# Patient Record
Sex: Female | Born: 1980
Health system: Southern US, Community
[De-identification: ages and names within clinical notes are randomized; demographics above are authoritative.]

## PROBLEM LIST (undated history)

## (undated) DIAGNOSIS — C50211 Malignant neoplasm of upper-inner quadrant of right female breast: Secondary | ICD-10-CM

## (undated) DIAGNOSIS — M797 Fibromyalgia: Secondary | ICD-10-CM

## (undated) DIAGNOSIS — R51 Headache: Secondary | ICD-10-CM

## (undated) DIAGNOSIS — D649 Anemia, unspecified: Secondary | ICD-10-CM

## (undated) DIAGNOSIS — F419 Anxiety disorder, unspecified: Secondary | ICD-10-CM

## (undated) DIAGNOSIS — Z171 Estrogen receptor negative status [ER-]: Secondary | ICD-10-CM

## (undated) DIAGNOSIS — J45909 Unspecified asthma, uncomplicated: Secondary | ICD-10-CM

## (undated) DIAGNOSIS — Z9221 Personal history of antineoplastic chemotherapy: Secondary | ICD-10-CM

## (undated) DIAGNOSIS — R519 Headache, unspecified: Secondary | ICD-10-CM

## (undated) HISTORY — DX: Unspecified asthma, uncomplicated: J45.909

## (undated) HISTORY — DX: Malignant neoplasm of upper-inner quadrant of right female breast: C50.211

## (undated) HISTORY — DX: Fibromyalgia: M79.7

## (undated) HISTORY — PX: RHINOPLASTY: SUR1284

## (undated) HISTORY — DX: Estrogen receptor negative status (ER-): Z17.1

## (undated) HISTORY — PX: ABDOMINAL HYSTERECTOMY: SHX81

---

## 2004-10-23 ENCOUNTER — Ambulatory Visit (HOSPITAL_BASED_OUTPATIENT_CLINIC_OR_DEPARTMENT_OTHER): Admission: RE | Admit: 2004-10-23 | Discharge: 2004-10-23 | Payer: Self-pay | Admitting: Otolaryngology

## 2004-10-23 ENCOUNTER — Ambulatory Visit (HOSPITAL_COMMUNITY): Admission: RE | Admit: 2004-10-23 | Discharge: 2004-10-23 | Payer: Self-pay | Admitting: Otolaryngology

## 2007-04-30 HISTORY — PX: KNEE SURGERY: SHX244

## 2014-01-17 DIAGNOSIS — Z1501 Genetic susceptibility to malignant neoplasm of breast: Secondary | ICD-10-CM | POA: Insufficient documentation

## 2014-01-17 DIAGNOSIS — R946 Abnormal results of thyroid function studies: Secondary | ICD-10-CM | POA: Insufficient documentation

## 2014-01-17 DIAGNOSIS — Z1502 Genetic susceptibility to malignant neoplasm of ovary: Secondary | ICD-10-CM

## 2014-01-17 DIAGNOSIS — M797 Fibromyalgia: Secondary | ICD-10-CM | POA: Insufficient documentation

## 2014-01-17 DIAGNOSIS — E785 Hyperlipidemia, unspecified: Secondary | ICD-10-CM | POA: Insufficient documentation

## 2014-01-17 DIAGNOSIS — R5383 Other fatigue: Secondary | ICD-10-CM | POA: Insufficient documentation

## 2014-03-18 ENCOUNTER — Encounter: Payer: Self-pay | Admitting: Gynecology

## 2014-03-18 ENCOUNTER — Ambulatory Visit: Attending: Gynecology | Admitting: Gynecology

## 2014-03-18 VITALS — BP 106/66 | HR 73 | Temp 98.3°F | Resp 20 | Ht 67.0 in | Wt 137.0 lb

## 2014-03-18 DIAGNOSIS — Z148 Genetic carrier of other disease: Secondary | ICD-10-CM | POA: Diagnosis not present

## 2014-03-18 DIAGNOSIS — Z7982 Long term (current) use of aspirin: Secondary | ICD-10-CM | POA: Insufficient documentation

## 2014-03-18 DIAGNOSIS — Z8041 Family history of malignant neoplasm of ovary: Secondary | ICD-10-CM | POA: Insufficient documentation

## 2014-03-18 DIAGNOSIS — Z87891 Personal history of nicotine dependence: Secondary | ICD-10-CM | POA: Insufficient documentation

## 2014-03-18 DIAGNOSIS — Z1509 Genetic susceptibility to other malignant neoplasm: Secondary | ICD-10-CM

## 2014-03-18 DIAGNOSIS — Z1502 Genetic susceptibility to malignant neoplasm of ovary: Secondary | ICD-10-CM

## 2014-03-18 DIAGNOSIS — Z79899 Other long term (current) drug therapy: Secondary | ICD-10-CM | POA: Diagnosis not present

## 2014-03-18 DIAGNOSIS — Z803 Family history of malignant neoplasm of breast: Secondary | ICD-10-CM

## 2014-03-18 DIAGNOSIS — Z1501 Genetic susceptibility to malignant neoplasm of breast: Secondary | ICD-10-CM

## 2014-03-18 NOTE — Patient Instructions (Signed)
Please call Dr. Fermin Schwab regarding your decision for surgery.

## 2014-03-18 NOTE — Progress Notes (Signed)
Consult Note: Gyn-Onc   Holly Parker 33 y.o. female  Chief Complaint  Patient presents with  . BRAC positive    2nd opinion    Assessment : 33 year old with a BRCA 1 mutation. She has a sister developed breast cancer at age 17 and a mother who developed advanced ovarian cancer at age 25.  Plan: Plan lengthy discussion about management options. The patient and her mother participated in the discussion. They're both aware that the patient has a significantly increased risk of developing both breast and ovarian cancer.  #1 ovarian cancer: Given that we do not have effective screening for ovarian cancer, bilateral salpingo-oophorectomy has been generally recommended in this high risk group of patients. At this juncture the patient is not quite certain that she wishes to proceed with that surgery although she believes she'll make a decision in the near future. The benefits of bilateral salpingo-oophorectomy were reviewed. In addition, the risks including impact and cardiovascular health, osteoporosis, and menopausal symptoms were also discussed. The pros and cons of performing hysterectomy the same time were discussed. In general I would not recommend performing a hysterectomy without clearcut benefits. I indicated that surgery could be performed laparoscopically has an outpatient in most cases. In the interval until the patient decides to go ahead with surgery, I would recommend that she have the pelvic ultrasound as well as a CA-125 value obtained. While "screening" for ovarian cancer has not been shown to be effective, the NCCN guidelines make that recommendation. Finally, if the patient chose to defer surgery for several years, and recommend that she be placed on combination oral contraceptives because of the demonstrated reduction in the incidence of ovarian cancer in oral contraceptive users.  #2 breast cancer: The patient also understands she is at increased risk of breast cancer. She has  had previous mammograms and an MRI was recommended by her primary care physician. Risk reduction by performing bilateral mastectomy and breast reconstruction was discussed. She is undecided as to when she would like to have mastectomies, at what location, and by what plastic surgeon.  All questions are answered and I would be happy to perform the bilateral salpingo-oophorectomy laparoscopically if she wished. In addition, the happy to help assist in making contact with a breast surgeon   HPI: 33 year old white married female gravida 3 para 3 who has recently been identified as carrying a BRCA1 mutation. Her mother at age 9 developed a stage IIIB ovarian cancer. I was her primary gynecologic oncologist performing her surgery as well as chemotherapy. The mother remains free of disease now with 12 years of follow-up.  In the interval, the patient's sister at age 32 developed breast cancer. With 2 family members having breast and ovarian cancer, the patient undertook genetic counseling and BRCA1 testing which turned out to be positive. She presents today for discussion of management options.  Review of Systems:10 point review of systems is negative except as noted in interval history.   Vitals: Blood pressure 106/66, pulse 73, temperature 98.3 F (36.8 C), temperature source Oral, resp. rate 20, height '5\' 7"'  (1.702 m), weight 137 lb (62.143 kg).  Physical Exam: Deferred       Not on File  History reviewed. No pertinent past medical history.  No past surgical history on file.  Current Outpatient Prescriptions  Medication Sig Dispense Refill  . sertraline (ZOLOFT) 100 MG tablet Take 100 mg by mouth.    Marland Kitchen aspirin-acetaminophen-caffeine (EXCEDRIN MIGRAINE) 250-250-65 MG per tablet Take 1 tablet by mouth  as needed for headache.    . cetirizine (ZYRTEC) 10 MG tablet Take 10 mg by mouth daily.    . Pseudoephedrine-Naproxen Na (ALEVE-D SINUS & HEADACHE PO) Take 1 each by mouth as needed.     No  current facility-administered medications for this visit.    History   Social History  . Marital Status: Single    Spouse Name: N/A    Number of Children: N/A  . Years of Education: N/A   Occupational History  . Not on file.   Social History Main Topics  . Smoking status: Former Smoker    Quit date: 09/16/2011  . Smokeless tobacco: Not on file  . Alcohol Use: No  . Drug Use: No  . Sexual Activity: Yes   Other Topics Concern  . Not on file   Social History Narrative  . No narrative on file    No family history on file.    Alvino Chapel, MD 03/18/2014, 12:22 PM

## 2016-04-29 DIAGNOSIS — Z171 Estrogen receptor negative status [ER-]: Secondary | ICD-10-CM

## 2016-04-29 DIAGNOSIS — C50211 Malignant neoplasm of upper-inner quadrant of right female breast: Secondary | ICD-10-CM

## 2016-04-29 HISTORY — DX: Malignant neoplasm of upper-inner quadrant of right female breast: C50.211

## 2016-04-29 HISTORY — DX: Estrogen receptor negative status (ER-): Z17.1

## 2016-06-28 DIAGNOSIS — J40 Bronchitis, not specified as acute or chronic: Secondary | ICD-10-CM | POA: Insufficient documentation

## 2016-08-21 DIAGNOSIS — K5909 Other constipation: Secondary | ICD-10-CM | POA: Insufficient documentation

## 2017-02-24 ENCOUNTER — Encounter: Payer: Self-pay | Admitting: Oncology

## 2017-02-27 ENCOUNTER — Ambulatory Visit: Admitting: Oncology

## 2017-02-28 ENCOUNTER — Ambulatory Visit: Admitting: Oncology

## 2017-02-28 ENCOUNTER — Telehealth: Payer: Self-pay | Admitting: Internal Medicine

## 2017-02-28 ENCOUNTER — Ambulatory Visit (HOSPITAL_BASED_OUTPATIENT_CLINIC_OR_DEPARTMENT_OTHER): Admitting: Oncology

## 2017-02-28 VITALS — BP 123/75 | HR 76 | Temp 97.6°F | Resp 18 | Ht 67.0 in | Wt 144.2 lb

## 2017-02-28 DIAGNOSIS — Z171 Estrogen receptor negative status [ER-]: Secondary | ICD-10-CM

## 2017-02-28 DIAGNOSIS — C50211 Malignant neoplasm of upper-inner quadrant of right female breast: Secondary | ICD-10-CM

## 2017-02-28 DIAGNOSIS — Z1501 Genetic susceptibility to malignant neoplasm of breast: Secondary | ICD-10-CM

## 2017-02-28 DIAGNOSIS — C50911 Malignant neoplasm of unspecified site of right female breast: Secondary | ICD-10-CM | POA: Insufficient documentation

## 2017-02-28 DIAGNOSIS — Z1502 Genetic susceptibility to malignant neoplasm of ovary: Principal | ICD-10-CM

## 2017-02-28 NOTE — Telephone Encounter (Signed)
Consult for Breast Ca .Appt transferred from [facility]/DR Magrinat to Dr B, per Ins non par.  New patient packet left at Registration for patient to complete upon arrival.  Appt schd and conf with patient.  (schd per Stacey/Nurse Mgr/45 mins/ no referral)  Patient will fax notes**

## 2017-02-28 NOTE — Telephone Encounter (Signed)
BRCA-1 carrier per hx; upper outer right- Her -2NEG; ER/PR- ? Marland Kitchen Awaiting further records. appt nov 5th.

## 2017-02-28 NOTE — Progress Notes (Signed)
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  She brought it up to the attention of her gynecologist, Dr. Donn Parker and was set up for unilateral diagnostic mammography with tomography and ultrasonography at [facility] on 02/17/2017. The breast density was category C. There is a highly suspicious mass at 1 o'clock in the medial right breast measuring 1.3 cm. There is a second indeterminate mass at 12 o'clock, immediately behind the nipple, and is low in density, measuring 0.6 cm. There is no axillary adenopathy.   Biopsy of the mass at 1 o'clock was obtained on 02/20/2017. The final pathology ([id number]) showed Invasive Ductal Carcinoma, Grade 3. The biopsy was 0% negative for both estrogen and progesterone receptors. The Proliferation Marker Ki67 showed 80%. HER-2 negative as well, with signals ratio at 1.25 and number per cell at 1.75.   Of note, patient is BRCA 1 positive.   Holly Parker subsequent history is as detailed below.  INTERVAL HISTORY: Holly Parker was evaluated in the breast clinic February 28, 2017 accompanied by her husband Holly Parker.   REVIEW OF SYSTEMS: Holly Parker reports intermittent pain to her right breast. She has frequent, severe, headaches that may be related to her seasonal allergies and bruxism. She  denies visual changes, nausea, vomiting, or dizziness. There has been no unusual cough, phlegm production, or pleurisy. This been no change in bowel or bladder habits. She denies unexplained fatigue or unexplained weight loss, bleeding, rash, or fever. A detailed review of systems was otherwise entirely negative.   PAST MEDICAL HISTORY: No past medical history on file.  Many years ago she was diagnosed with Fibromyalgia, which is currently stable. She was very active growing up playing many different sports and was diagnosed with exercise induced asthma. Pt also smoked 0.5 ppd for 10 years, but quit about 5 years ago. The last three years she has had pneumonia twice. She has good bladder control. She denies a history of ulcers and glaucoma.  PAST SURGICAL HISTORY: No past surgical history on file.  When she was living in [place], [place], she had knee surgery for her torn meniscus. She also had two rhino plasties. The first time was to correct her deviated septum. The second surgery was corrective after she broke her nose playing sports. No past surgical history of a colonoscopy or bone density scan.  FAMILY HISTORY No family history on file.  The patient's father, 50 and is alive. Her mother, Holly Parker, 15 is also alive and has a PMHx of ovarian cancer and breast cancer. No brothers. She has 1 sister, Holly Parker, who also had breast cancer.  Of course, both Holly Parker and Holly Parker have been my patients   GYNECOLOGIC HISTORY:  No LMP recorded.  Menarche: 36 years old  [id number] Pt was 36 years old the first time she carried a baby to term. She still has her menstrual cycle which will last for about 4-5 days each time, with about 2 of those days  with a heavy flow.   SOCIAL HISTORY:  Holly Parker is a [employer]. Her husband, Holly Parker, is a Government social research officer at [employer]. He is a retired Company secretary. They have three children. She has one son, Holly Parker, who is 61 years old, and two daughters, Holly Parker, 52, and Holly Parker,  4. She attends [employer] or [employer].    ADVANCED DIRECTIVES: The patient and her husband are each other's healthcare power of attorney   HEALTH MAINTENANCE: Social History  Substance Use Topics  . Smoking status: Former Smoker    Quit date: 09/16/2011  . Smokeless tobacco: Not on file  . Alcohol use No     Colonoscopy: No colonoscopy  PAP: Up-to-date  Bone density: No bone density   No Known Allergies  Current Outpatient Prescriptions  Medication Sig Dispense Refill  . aspirin-acetaminophen-caffeine (EXCEDRIN MIGRAINE) 250-250-65 MG per tablet Take 1 tablet by mouth as needed for headache.    . cetirizine (ZYRTEC) 10 MG tablet Take 10 mg by mouth daily.    . Pseudoephedrine-Naproxen Na (ALEVE-D SINUS & HEADACHE PO) Take 1 each by mouth as needed.    . sertraline (ZOLOFT) 100 MG tablet Take 100 mg by mouth.     No current facility-administered medications for this visit.     OBJECTIVE: Young white woman who appears well  Vitals:   02/28/17 1630  BP: 123/75  Pulse: 76  Resp: 18  Temp: 97.6 F (36.4 C)  SpO2: 100%     Body mass index is 22.58 kg/m.   Wt Readings from Last 3 Encounters:  02/28/17 144 lb 3.2 oz (65.4 kg)  03/18/14 137 lb (62.1 kg)      ECOG FS:0 - Asymptomatic  Ocular: Sclerae unicteric, pupils round and equal Ear-nose-throat: Oropharynx clear and moist Lymphatic: No cervical or supraclavicular adenopathy Lungs no rales or rhonchi Heart regular rate and rhythm Abd soft, nontender, positive bowel sounds MSK no focal spinal tenderness, no joint edema Neuro: non-focal, well-oriented, appropriate affect Breasts: Right breast is status post recent biopsy.  There is a minimal ecchymosis.  I do not palpate a well-defined mass.  The left breast is benign.  Both axillae are benign.   LAB RESULTS:  CMP  No results found for: NA, K, CL, CO2, GLUCOSE, BUN, CREATININE, CALCIUM, PROT, ALBUMIN, AST, ALT, ALKPHOS, BILITOT, GFRNONAA, GFRAA  No  results found for: TOTALPROTELP, ALBUMINELP, A1GS, A2GS, BETS, BETA2SER, GAMS, MSPIKE, SPEI  No results found for: KPAFRELGTCHN, LAMBDASER, KAPLAMBRATIO  No results found for: WBC, NEUTROABS, HGB, HCT, MCV, PLT  '@LASTCHEMISTRY' @  No results found for: LABCA2  No components found for: TLXBWI203  No results for input(s): INR in the last 168 hours.  No results found for: LABCA2  No results found for: TDH741  No results found for: ULA453  No results found for: MIW803  No results found for: CA2729  No components found for: HGQUANT  No results found for: CEA1 / No results found for: CEA1   No results found for: AFPTUMOR  No results found for: CHROMOGRNA  No results found for: PSA1  No visits with results within 3 Day(s) from this visit.  Latest known visit with results is:  No results found for any previous visit.    (this displays the last labs from the last 3 days)  No results found for: TOTALPROTELP, ALBUMINELP, A1GS, A2GS, BETS, BETA2SER, GAMS, MSPIKE, SPEI (this displays SPEP labs)  No results found for: KPAFRELGTCHN, LAMBDASER, KAPLAMBRATIO (kappa/lambda light chains)  No results found for: HGBA,  HGBA2QUANT, HGBFQUANT, HGBSQUAN (Hemoglobinopathy evaluation)   No results found for: LDH  No results found for: IRON, TIBC, IRONPCTSAT (Iron and TIBC)  No results found for: FERRITIN  Urinalysis No results found for: COLORURINE, APPEARANCEUR, LABSPEC, PHURINE, GLUCOSEU, HGBUR, BILIRUBINUR, KETONESUR, PROTEINUR, UROBILINOGEN, NITRITE, LEUKOCYTESUR   STUDIES: Outside studies reviewed with patient  ELIGIBLE FOR AVAILABLE RESEARCH PROTOCOL: no  ASSESSMENT: 36 y.o. BRCA1 positive [place] woman status post right breast upper inner quadrant biopsy 02/20/2017 for a clinical T1c N0, stage IB invasive ductal carcinoma, grade 3, triple negative, with an MIB-1 of 80%  (1) recommend neoadjuvant chemotherapy with cyclophosphamide and doxorubicin in dose dense fashion  x4 followed by carboplatin and paclitaxel weekly x12  (2) bilateral mastectomies with right sentinel lymph node sampling to follow  (3) the patient is interested in reconstruction  (4) if the patient wishes to preserve fertility would consider goserelin monthly through chemo therapy  PLAN: We spent the better part of today's hour-long appointment discussing the biology of her diagnosis and the specifics of her situation. We first reviewed the fact that cancer is not one disease but more than 100 different diseases and that it is important to keep them separate-- otherwise when friends and relatives discuss their own cancer experiences with Shakthi confusion can result. Similarly we explained that if breast cancer spreads to the bone or liver, the patient would not have bone cancer or liver cancer, but breast cancer in the bone and breast cancer in the liver: one cancer in three places-- not 3 different cancers which otherwise would have to be treated in 3 different ways.  We discussed the difference between local and systemic therapy. In terms of loco-regional treatment, lumpectomy plus radiation is equivalent to mastectomy as far as survival is concerned.  However in patients with a BRCA mutation the risk of another breast cancer in the future is high enough that bilateral mastectomies are a reasonable option.  This is the option the patient prefers.  She understands that in most cases if she does undergo mastectomy radiation would not be necessary  We also noted that in terms of sequencing of treatments, whether systemic therapy or surgery is done first does not affect the ultimate outcome.  In Seara's case I would recommend neoadjuvant chemotherapy, because he will gave her several months so she can plan her surgery and specifically the type of reconstruction she may want to end up with  We then discussed the rationale for systemic therapy. There is some risk that this cancer may have already  spread to other parts of her body. Patients frequently ask at this point about bone scans, CAT scans and PET scans to find out if they have occult breast cancer somewhere else. The problem is that in early stage disease we are much more likely to find false positives then true cancers and this would expose the patient to unnecessary procedures as well as unnecessary radiation. Scans cannot answer the question the patient really would like to know, which is whether she has microscopic disease elsewhere in her body. For those reasons we do not recommend them.  Of course we would proceed to aggressive evaluation of any symptoms that might suggest metastatic disease, but that is not the case here.  Next we went over the options for systemic therapy which are anti-estrogens, anti-HER-2 immunotherapy, and chemotherapy. Chika does not meet criteria for anti-HER-2 immunotherapy or antiestrogens.  Her only systemic treatment option is chemotherapy.  We specifically discussed cyclophosphamide and doxorubicin in  dose dense fashion followed by carboplatin and paclitaxel weekly x12.  This should result in a high chance of complete pathologic response.  Kalyssa already is going to meet my partner Dr Rogue Bussing next week in [place] because her insurance considers our [place] office to be out of network.  In a sense therefore she sought a "second opinion first".  She understands that I can only tell her how I would treat her but this in no way compromises my partner who may or may not agree with this plan and may propose a different plan when they do meet next week.  Keyia wishes for me to initiate referrals to Dr. Ninfa Linden and Dr. Marla Roe.  She hopes to be able to have a port placed as soon as possible.  Of course she will need other tests which will be arranged through our [place] office  At this point I have not made a return appointment here for [facility].  She knows I will be glad to see her again at any  point in the future if and when the need arises.   Dayshon Roback, Holly Dad, MD  02/28/17 5:39 PM Medical Oncology and Hematology [facility] [address] [place], [place] [address] Tel. [phone]    Fax. [phone]  This document serves as a record of services personally performed by Chauncey Cruel, MD. It was created on his behalf by Margit Banda, a trained medical scribe. The creation of this record is based on the scribe's personal observations and the provider's statements to them. This document has been checked and approved by the attending provider.

## 2017-03-03 ENCOUNTER — Encounter: Payer: Self-pay | Admitting: *Deleted

## 2017-03-03 ENCOUNTER — Encounter: Payer: Self-pay | Admitting: Internal Medicine

## 2017-03-03 ENCOUNTER — Encounter: Payer: Self-pay | Admitting: Oncology

## 2017-03-03 ENCOUNTER — Inpatient Hospital Stay

## 2017-03-03 ENCOUNTER — Inpatient Hospital Stay: Attending: Internal Medicine | Admitting: Internal Medicine

## 2017-03-03 ENCOUNTER — Encounter (INDEPENDENT_AMBULATORY_CARE_PROVIDER_SITE_OTHER): Payer: Self-pay

## 2017-03-03 VITALS — BP 110/73 | HR 79 | Resp 14 | Wt 143.6 lb

## 2017-03-03 DIAGNOSIS — Z006 Encounter for examination for normal comparison and control in clinical research program: Secondary | ICD-10-CM | POA: Diagnosis not present

## 2017-03-03 DIAGNOSIS — Z5111 Encounter for antineoplastic chemotherapy: Secondary | ICD-10-CM | POA: Insufficient documentation

## 2017-03-03 DIAGNOSIS — Z23 Encounter for immunization: Secondary | ICD-10-CM | POA: Diagnosis not present

## 2017-03-03 DIAGNOSIS — Z79899 Other long term (current) drug therapy: Secondary | ICD-10-CM | POA: Diagnosis not present

## 2017-03-03 DIAGNOSIS — Z171 Estrogen receptor negative status [ER-]: Secondary | ICD-10-CM | POA: Diagnosis not present

## 2017-03-03 DIAGNOSIS — Z803 Family history of malignant neoplasm of breast: Secondary | ICD-10-CM | POA: Diagnosis not present

## 2017-03-03 DIAGNOSIS — M797 Fibromyalgia: Secondary | ICD-10-CM

## 2017-03-03 DIAGNOSIS — Z8041 Family history of malignant neoplasm of ovary: Secondary | ICD-10-CM | POA: Insufficient documentation

## 2017-03-03 DIAGNOSIS — Z801 Family history of malignant neoplasm of trachea, bronchus and lung: Secondary | ICD-10-CM | POA: Insufficient documentation

## 2017-03-03 DIAGNOSIS — Z87891 Personal history of nicotine dependence: Secondary | ICD-10-CM | POA: Insufficient documentation

## 2017-03-03 DIAGNOSIS — Z806 Family history of leukemia: Secondary | ICD-10-CM | POA: Insufficient documentation

## 2017-03-03 DIAGNOSIS — J45909 Unspecified asthma, uncomplicated: Secondary | ICD-10-CM | POA: Insufficient documentation

## 2017-03-03 DIAGNOSIS — C50211 Malignant neoplasm of upper-inner quadrant of right female breast: Secondary | ICD-10-CM

## 2017-03-03 LAB — COMPREHENSIVE METABOLIC PANEL
ALK PHOS: 69 U/L (ref 38–126)
ALT: 12 U/L — AB (ref 14–54)
AST: 17 U/L (ref 15–41)
Albumin: 4.5 g/dL (ref 3.5–5.0)
Anion gap: 7 (ref 5–15)
BILIRUBIN TOTAL: 0.5 mg/dL (ref 0.3–1.2)
BUN: 15 mg/dL (ref 6–20)
CALCIUM: 9.3 mg/dL (ref 8.9–10.3)
CO2: 27 mmol/L (ref 22–32)
CREATININE: 0.65 mg/dL (ref 0.44–1.00)
Chloride: 100 mmol/L — ABNORMAL LOW (ref 101–111)
Glucose, Bld: 88 mg/dL (ref 65–99)
Potassium: 4.1 mmol/L (ref 3.5–5.1)
Sodium: 134 mmol/L — ABNORMAL LOW (ref 135–145)
Total Protein: 7.9 g/dL (ref 6.5–8.1)

## 2017-03-03 LAB — CBC WITH DIFFERENTIAL/PLATELET
Basophils Absolute: 0.1 10*3/uL (ref 0–0.1)
Basophils Relative: 1 %
EOS ABS: 0.2 10*3/uL (ref 0–0.7)
Eosinophils Relative: 3 %
HCT: 38.5 % (ref 35.0–47.0)
HEMOGLOBIN: 13.2 g/dL (ref 12.0–16.0)
LYMPHS ABS: 2.3 10*3/uL (ref 1.0–3.6)
Lymphocytes Relative: 27 %
MCH: 31.2 pg (ref 26.0–34.0)
MCHC: 34.2 g/dL (ref 32.0–36.0)
MCV: 91.3 fL (ref 80.0–100.0)
Monocytes Absolute: 0.5 10*3/uL (ref 0.2–0.9)
Monocytes Relative: 6 %
NEUTROS PCT: 63 %
Neutro Abs: 5.5 10*3/uL (ref 1.4–6.5)
PLATELETS: 366 10*3/uL (ref 150–440)
RBC: 4.22 MIL/uL (ref 3.80–5.20)
RDW: 13 % (ref 11.5–14.5)
WBC: 8.6 10*3/uL (ref 3.6–11.0)

## 2017-03-03 MED ORDER — PNEUMOCOCCAL VAC POLYVALENT 25 MCG/0.5ML IJ INJ
0.5000 mL | INJECTION | Freq: Once | INTRAMUSCULAR | Status: DC
  Filled 2017-03-03: qty 0.5

## 2017-03-03 MED ORDER — INFLUENZA VAC SPLIT QUAD 0.5 ML IM SUSY
0.5000 mL | PREFILLED_SYRINGE | Freq: Once | INTRAMUSCULAR | Status: AC
  Administered 2017-03-03: 0.5 mL via INTRAMUSCULAR
  Filled 2017-03-03: qty 0.5

## 2017-03-03 NOTE — Progress Notes (Signed)
[facility]  Patient Care Team: Patient, No Pcp Per as PCP - General (General Practice) Magrinat, Virgie Dad, MD as Consulting Physician (Oncology) Armandina Stammer, MD (Obstetrics and Gynecology) Dillingham, Loel Lofty, DO as Attending Physician (Plastic Surgery) Coralie Keens, MD as Consulting Physician (General Surgery) Cammie Sickle, MD as Consulting Physician (Internal Medicine)  CHIEF COMPLAINTS/PURPOSE OF CONSULTATION:  Breast cancer  #  Oncology History   # OCT 2018- RIGHT BREAST UIQ -[place]; TRIPLE NEGATIVE; ki-67-high [25m-1'O clock-Bx- proven; [place]]; another- 12'O clock-41m Not biopsied. [s/p Dr.Magrinaut; [place]]; cT1c(m) cN0;  G-3; ki-67- 80%  #   # BRCA-1 Gene mutated [mom-ovarian cancer-Stage IIIB/Brca; older sister- breast ca/brca-mutated]     Malignant neoplasm of upper-inner quadrant of right breast in female, estrogen receptor negative ([facility]   HISTORY OF PRESENTING ILLNESS:  Holly Payor89.o.  female with a history of BRCA1 mutation [diagnosis on screening]- noted to have intermittent right breast pain or the last 2 months. She followed up with a gynecologist- had a unilateral mammogram- highly suspicious mass at 1 o'clock in the medial right breast measuring 1.3 cm. There is a second indeterminate mass at 12 o'clock, immediately behind the nipple, and is low in density, measuring 0.6 cm. There is no axillary adenopathy.  [facility]. Patient is interested in bilateral mastectomies and reconstruction.   Otherwise patient does not have any chronic medical problems. She denies any unusual headaches or shortness of breath or cough.  ROS: A complete 10 point review of system is done which is negative except mentioned above in history of present illness  MEDICAL HISTORY:  Past Medical History:  Diagnosis Date  . Asthma    Excercise induced   . Fibromyalgia     SURGICAL HISTORY: Past Surgical History:  Procedure Laterality Date  . KNEE SURGERY Left    Torn meniscus   . 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 Marital status: Single    Spouse name: Not on file  . Number of children: Not on file  . Years of education: Not on file  . Highest education level: Not on file  Social Needs  . Financial resource strain: Not on file  . Food insecurity - worry: Not on file  . Food insecurity - inability: Not on file  . Transportation needs - medical: Not on file  . Transportation needs - non-medical: Not on file  Occupational History  . Not on file  Tobacco Use  . Smoking status: Former Smoker    Last attempt to quit: 09/16/2011    Years since quitting: 5.4  Substance and Sexual Activity  . Alcohol use: No  . Drug use: No  . Sexual activity: Yes  Other Topics Concern  . Not on file  Social History Narrative  . Not on file    FAMILY HISTORY: Family History  Problem Relation Age of Onset  . Cancer Mother        Ovarian cancer (2003), breast cancer(2017) & squamous cell   . Leukemia Mother   . Cancer Sister        breast cancer  . Melanoma Father   . Lung cancer Paternal Grandmother     ALLERGIES:  has No Known Allergies.  MEDICATIONS:  Current Outpatient Medications  Medication Sig Dispense Refill  . aspirin-acetaminophen-caffeine ([phone]  MG per tablet Take 1 tablet by mouth as needed for headache.    . loratadine (CLARITIN) 10 MG tablet Take 10 mg daily by mouth.    . Pseudoephedrine-Naproxen Na (ALEVE-D SINUS & HEADACHE PO) Take 1 each by mouth as needed.    . sertraline (ZOLOFT) 100 MG tablet Take 100 mg by mouth.     No current facility-administered medications for this visit.       Marland Kitchen  PHYSICAL EXAMINATION: ECOG PERFORMANCE STATUS: 0 - Asymptomatic  Vitals:   03/03/17  1409  BP: 110/73  Pulse: 79  Resp: 14   Filed Weights   03/03/17 1406 03/03/17 1409  Weight: 143 lb 9.6 oz (65.1 kg) 143 lb 9.6 oz (65.1 kg)    GENERAL: Well-nourished well-developed; Alert, no distress and comfortable.   Accompanied by her mother. EYES: no pallor or icterus OROPHARYNX: no thrush or ulceration; good dentition  NECK: supple, no masses felt LYMPH:  no palpable lymphadenopathy in the cervical, axillary or inguinal regions LUNGS: clear to auscultation and  No wheeze or crackles HEART/CVS: regular rate & rhythm and no murmurs; No lower extremity edema ABDOMEN: abdomen soft, non-tender and normal bowel sounds Musculoskeletal:no cyanosis of digits and no clubbing  PSYCH: alert & oriented x 3 with fluent speech NEURO: no focal motor/sensory deficits SKIN:  no rashes or significant lesions; .Right and left BREAST exam [in the presence of nurse]- right breast -no unusual skin changes; except for biopsy hematoma; ? Vague 1cm mass felt in upper innner quad;    LABORATORY DATA:  I have reviewed the data as listed Lab Results  Component Value Date   WBC 8.6 03/03/2017   HGB 13.2 03/03/2017   HCT 38.5 03/03/2017   MCV 91.3 03/03/2017   PLT 366 03/03/2017   Recent Labs    03/03/17 1548  NA 134*  K 4.1  CL 100*  CO2 27  GLUCOSE 88  BUN 15  CREATININE 0.65  CALCIUM 9.3  GFRNONAA >60  GFRAA >60  PROT 7.9  ALBUMIN 4.5  AST 17  ALT 12*  ALKPHOS 69  BILITOT 0.5    RADIOGRAPHIC STUDIES: I have personally reviewed the radiological images as listed and agreed with the findings in the report. No results found.  ASSESSMENT & PLAN:   Malignant neoplasm of upper-inner quadrant of right breast in female, estrogen receptor negative ([place]) # Likely stage I ER/PR negative right breast cancer- discussed the general treatment plan for breast cancer.  # Patient is interested in bilateral mastectomies. Discussed the need for chemotherapy either adjuvant or neoadjuvant. I  think it's reasonable to proceed with neoadjuvant chemotherapy at this time. Recommend dose dense Adriamycin and Cytoxan followed by weekly Taxol. I plan to adding carboplatin to weekly Taxol- especially given her BRCA positive disease/also being triple negative. Discussed that the chance for pathologic response as one study- went up from 45% to 60%. Discussed that she has a high likelihood of having pathologic response.   # Discussed the potential side effects including but not limited to-increasing fatigue, nausea vomiting, diarrhea, hair loss, sores in the mouth, increase risk of infection and also neuropathy. Also discussed regarding potential side effects of heart failure/leukemia with Adriamycin. Check a MUGA scan.  # Recommend a port placement. Recommend starting chemotherapy in approximately 1 week from now.   #Patient will need chemotherapy education.  Patient also follow-up with me with cycle #1 of chemotherapy.   #Patient will get a flu shot and also a pneumonia shot.  #  Also discussed regarding fertility; patient not sure if she wants to have any more children; I discussed option of using goserelin prior to starting chemotherapy.  She will inform me as soon as possible.   #Patient will need also need bilateral salpingo-oophorectomy down the line.  Thank you Dr.Magrinaut for allowing me to participate in the care of your pleasant patient. Please do not hesitate to contact me with questions or concerns in the interim.      All questions were answered. The patient knows to call the clinic with any problems, questions or concerns.    Cammie Sickle, MD 03/04/2017 5:05 PM

## 2017-03-03 NOTE — Progress Notes (Signed)
Patient is here for a new patient visit today.

## 2017-03-04 ENCOUNTER — Other Ambulatory Visit: Payer: Self-pay | Admitting: Surgery

## 2017-03-04 LAB — CANCER ANTIGEN 27.29: CAN 27.29: 21.7 U/mL (ref 0.0–38.6)

## 2017-03-04 MED ORDER — ONDANSETRON HCL 8 MG PO TABS: ORAL_TABLET | ORAL | 1 refills | Status: DC

## 2017-03-04 MED ORDER — LIDOCAINE-PRILOCAINE 2.5-2.5 % EX CREA: 1.0000 "application " | TOPICAL_CREAM | CUTANEOUS | 0 refills | Status: DC | PRN

## 2017-03-04 MED ORDER — PROCHLORPERAZINE MALEATE 10 MG PO TABS: 10.0000 mg | ORAL_TABLET | Freq: Four times a day (QID) | ORAL | 1 refills | Status: DC | PRN

## 2017-03-04 NOTE — Progress Notes (Signed)
START ON PATHWAY REGIMEN - Breast     A cycle is every 14 days (cycles 1-4):     Doxorubicin      Cyclophosphamide      Pegfilgrastim-xxxx    A cycle is every 21 days (cycles 5-8):     Paclitaxel      Carboplatin   **Always confirm dose/schedule in your pharmacy ordering system**    Patient Characteristics: Preoperative or Nonsurgical Candidate (Clinical Staging), Neoadjuvant Therapy followed by Surgery, Invasive Disease, Chemotherapy, HER2 Negative/Unknown/Equivocal, ER Negative/Unknown, Platinum Therapy Indicated Therapeutic Status: Preoperative or Nonsurgical Candidate (Clinical Staging) AJCC M Category: cM0 AJCC Grade: G3 Breast Surgical Plan: Neoadjuvant Therapy followed by Surgery ER Status: Negative (-) AJCC 8 Stage Grouping: IB HER2 Status: Negative (-) AJCC T Category: cT1c AJCC N Category: cN0 PR Status: Negative (-) Type of Therapy: Platinum Therapy Indicated Intent of Therapy: Curative Intent, Discussed with Patient

## 2017-03-04 NOTE — Assessment & Plan Note (Addendum)
#  Likely stage I ER/PR negative right breast cancer- discussed the general treatment plan for breast cancer.  # Patient is interested in bilateral mastectomies. Discussed the need for chemotherapy either adjuvant or neoadjuvant. I think it's reasonable to proceed with neoadjuvant chemotherapy at this time. Recommend dose dense Adriamycin and Cytoxan followed by weekly Taxol. I plan to adding carboplatin to weekly Taxol- especially given her BRCA positive disease/also being triple negative. Discussed that the chance for pathologic response as one study- went up from 45% to 60%. Discussed that she has a high likelihood of having pathologic response.   # Discussed the potential side effects including but not limited to-increasing fatigue, nausea vomiting, diarrhea, hair loss, sores in the mouth, increase risk of infection and also neuropathy. Also discussed regarding potential side effects of heart failure/leukemia with Adriamycin. Check a MUGA scan.  # Recommend a port placement. Recommend starting chemotherapy in approximately 1 week from now.   #Patient will need chemotherapy education.  Patient also follow-up with me with cycle #1 of chemotherapy.   #Patient will get a flu shot and also a pneumonia shot.  #Also discussed regarding fertility; patient not sure if she wants to have any more children; I discussed option of using goserelin prior to starting chemotherapy.  She will inform me as soon as possible.   #Patient will need also need bilateral salpingo-oophorectomy down the line.  Thank you Dr.Magrinaut for allowing me to participate in the care of your pleasant patient. Please do not hesitate to contact me with questions or concerns in the interim.

## 2017-03-04 NOTE — Progress Notes (Signed)
  Oncology Nurse Navigator Documentation  Navigator Location: [facility] (03/04/17 1000) Referral date to RadOnc/MedOnc: 03/03/17 (03/04/17 1000) )Navigator Encounter Type: Initial MedOnc (03/04/17 1000)                       Treatment Phase: Pre-Tx/Tx Discussion (03/04/17 1000) Barriers/Navigation Needs: Education (03/04/17 1000) Education: Understanding Cancer/ Treatment Options;Newly Diagnosed Cancer Education (03/04/17 1000) Interventions: Education (03/04/17 1000)     Education Method: Written (03/04/17 1000)  Support Groups/Services: Kids Can;Breast Support Group (03/04/17 1000)             Time Spent with Patient: 90 (03/04/17 1000)   Met patient and her mom during her initial medical oncology consult with Dr. Rogue Bussing.  Patient is BRCA 1 positive, and newly diagnosed with triple negative right breast cancer.  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  Dr. Rogue Bussing discussed future desire to have more children.  He is considering additional treatment to preserve fertility.  She is going to discuss with her husband and let him know her plans.  Gave patient breast cancer educational literature, "My Breast Cancer Treatment Handbook" by Josephine Igo, RN.    She is to call if she has any questions or needs.

## 2017-03-05 ENCOUNTER — Inpatient Hospital Stay

## 2017-03-05 DIAGNOSIS — Z23 Encounter for immunization: Secondary | ICD-10-CM | POA: Insufficient documentation

## 2017-03-06 ENCOUNTER — Encounter
Admission: RE | Admit: 2017-03-06 | Discharge: 2017-03-06 | Disposition: A | Discharge status: Home / Self Care | Source: Ambulatory Visit | Attending: Internal Medicine | Admitting: Internal Medicine

## 2017-03-06 ENCOUNTER — Inpatient Hospital Stay

## 2017-03-06 ENCOUNTER — Other Ambulatory Visit: Payer: Self-pay

## 2017-03-06 ENCOUNTER — Telehealth: Payer: Self-pay | Admitting: Oncology

## 2017-03-06 DIAGNOSIS — C50211 Malignant neoplasm of upper-inner quadrant of right female breast: Secondary | ICD-10-CM | POA: Diagnosis present

## 2017-03-06 DIAGNOSIS — Z23 Encounter for immunization: Secondary | ICD-10-CM

## 2017-03-06 DIAGNOSIS — Z5111 Encounter for antineoplastic chemotherapy: Secondary | ICD-10-CM | POA: Diagnosis not present

## 2017-03-06 DIAGNOSIS — Z171 Estrogen receptor negative status [ER-]: Secondary | ICD-10-CM | POA: Diagnosis present

## 2017-03-06 MED ORDER — PNEUMOCOCCAL VAC POLYVALENT 25 MCG/0.5ML IJ INJ
0.5000 mL | INJECTION | Freq: Once | INTRAMUSCULAR | Status: AC
  Administered 2017-03-06: 0.5 mL via INTRAMUSCULAR
  Filled 2017-03-06 (×6): qty 0.5

## 2017-03-06 MED ORDER — TECHNETIUM TC 99M-LABELED RED BLOOD CELLS IV KIT
23.8100 | PACK | Freq: Once | INTRAVENOUS | Status: AC | PRN
  Administered 2017-03-06: 23.81 via INTRAVENOUS

## 2017-03-06 NOTE — Telephone Encounter (Signed)
Per 11/2 - no los at check out °

## 2017-03-07 ENCOUNTER — Telehealth: Payer: Self-pay | Admitting: *Deleted

## 2017-03-07 NOTE — Telephone Encounter (Signed)
Spoke with patient. IR port placement scheduled for 11/19. Pt instructed to be NPO s/p midnight prior to procedure. And to [facility] at 10 am. Teach back process performed. Patient also informed that MD sent new rx for emla cream and antiemetics to pharmacy. Pt also instructed to pick up OTC imodium ad and stool softeners to have on available for chemotherapy induced side effects.  Pt also provided an apt for chemotherapy - AC. She prefers to start on Wednesday 11/21 in [place] as her family would be off on this day instead of that Tuesday in [place].

## 2017-03-10 ENCOUNTER — Other Ambulatory Visit: Payer: Self-pay | Admitting: Surgery

## 2017-03-10 ENCOUNTER — Inpatient Hospital Stay

## 2017-03-10 NOTE — Patient Instructions (Signed)
Doxorubicin injection What is this medicine? DOXORUBICIN (dox oh ROO bi sin) is a chemotherapy drug. It is used to treat many kinds of cancer like leukemia, lymphoma, neuroblastoma, sarcoma, and Wilms' tumor. It is also used to treat bladder cancer, breast cancer, lung cancer, ovarian cancer, stomach cancer, and thyroid cancer. This medicine may be used for other purposes; ask your health care provider or pharmacist if you have questions. COMMON BRAND NAME(S): Adriamycin, Adriamycin PFS, Adriamycin RDF, Rubex What should I tell my health care provider before I take this medicine? They need to know if you have any of these conditions: -heart disease -history of low blood counts caused by a medicine -liver disease -recent or ongoing radiation therapy -an unusual or allergic reaction to doxorubicin, other chemotherapy agents, other medicines, foods, dyes, or preservatives -pregnant or trying to get pregnant -breast-feeding How should I use this medicine? This drug is given as an infusion into a vein. It is administered in a hospital or clinic by a specially trained health care professional. If you have pain, swelling, burning or any unusual feeling around the site of your injection, tell your health care professional right away. Talk to your pediatrician regarding the use of this medicine in children. Special care may be needed. Overdosage: If you think you have taken too much of this medicine contact a poison control center or emergency room at once. NOTE: This medicine is only for you. Do not share this medicine with others. What if I miss a dose? It is important not to miss your dose. Call your doctor or health care professional if you are unable to keep an appointment. What may interact with this medicine? This medicine may interact with the following medications: -6-mercaptopurine -paclitaxel -phenytoin -St. John's Wort -trastuzumab -verapamil This list may not describe all possible  interactions. Give your health care provider a list of all the medicines, herbs, non-prescription drugs, or dietary supplements you use. Also tell them if you smoke, drink alcohol, or use illegal drugs. Some items may interact with your medicine. What should I watch for while using this medicine? This drug may make you feel generally unwell. This is not uncommon, as chemotherapy can affect healthy cells as well as cancer cells. Report any side effects. Continue your course of treatment even though you feel ill unless your doctor tells you to stop. There is a maximum amount of this medicine you should receive throughout your life. The amount depends on the medical condition being treated and your overall health. Your doctor will watch how much of this medicine you receive in your lifetime. Tell your doctor if you have taken this medicine before. You may need blood work done while you are taking this medicine. Your urine may turn red for a few days after your dose. This is not blood. If your urine is dark or brown, call your doctor. In some cases, you may be given additional medicines to help with side effects. Follow all directions for their use. Call your doctor or health care professional for advice if you get a fever, chills or sore throat, or other symptoms of a cold or flu. Do not treat yourself. This drug decreases your body's ability to fight infections. Try to avoid being around people who are sick. This medicine may increase your risk to bruise or bleed. Call your doctor or health care professional if you notice any unusual bleeding. Talk to your doctor about your risk of cancer. You may be more at risk for certain   types of cancers if you take this medicine. Do not become pregnant while taking this medicine or for 6 months after stopping it. Women should inform their doctor if they wish to become pregnant or think they might be pregnant. Men should not father a child while taking this medicine and  for 6 months after stopping it. There is a potential for serious side effects to an unborn child. Talk to your health care professional or pharmacist for more information. Do not breast-feed an infant while taking this medicine. This medicine has caused ovarian failure in some women and reduced sperm counts in some men This medicine may interfere with the ability to have a child. Talk with your doctor or health care professional if you are concerned about your fertility. What side effects may I notice from receiving this medicine? Side effects that you should report to your doctor or health care professional as soon as possible: -allergic reactions like skin rash, itching or hives, swelling of the face, lips, or tongue -breathing problems -chest pain -fast or irregular heartbeat -low blood counts - this medicine may decrease the number of white blood cells, red blood cells and platelets. You may be at increased risk for infections and bleeding. -pain, redness, or irritation at site where injected -signs of infection - fever or chills, cough, sore throat, pain or difficulty passing urine -signs of decreased platelets or bleeding - bruising, pinpoint red spots on the skin, black, tarry stools, blood in the urine -swelling of the ankles, feet, hands -tiredness -weakness Side effects that usually do not require medical attention (report to your doctor or health care professional if they continue or are bothersome): -diarrhea -hair loss -mouth sores -nail discoloration or damage -nausea -red colored urine -vomiting This list may not describe all possible side effects. Call your doctor for medical advice about side effects. You may report side effects to FDA at 1-800-FDA-1088. Where should I keep my medicine? This drug is given in a hospital or clinic and will not be stored at home. NOTE: This sheet is a summary. It may not cover all possible information. If you have questions about this  medicine, talk to your doctor, pharmacist, or health care provider.  2018 [employer]/Gold Standard (2015-06-12 11:28:51) Cyclophosphamide injection What is this medicine? CYCLOPHOSPHAMIDE (sye kloe FOSS fa mide) is a chemotherapy drug. It slows the growth of cancer cells. This medicine is used to treat many types of cancer like lymphoma, myeloma, leukemia, breast cancer, and ovarian cancer, to name a few. This medicine may be used for other purposes; ask your health care provider or pharmacist if you have questions. COMMON BRAND NAME(S): Cytoxan, Neosar What should I tell my health care provider before I take this medicine? They need to know if you have any of these conditions: -blood disorders -history of other chemotherapy -infection -kidney disease -liver disease -recent or ongoing radiation therapy -tumors in the bone marrow -an unusual or allergic reaction to cyclophosphamide, other chemotherapy, other medicines, foods, dyes, or preservatives -pregnant or trying to get pregnant -breast-feeding How should I use this medicine? This drug is usually given as an injection into a vein or muscle or by infusion into a vein. It is administered in a hospital or clinic by a specially trained health care professional. Talk to your pediatrician regarding the use of this medicine in children. Special care may be needed. Overdosage: If you think you have taken too much of this medicine contact a poison control center or emergency room at   once. NOTE: This medicine is only for you. Do not share this medicine with others. What if I miss a dose? It is important not to miss your dose. Call your doctor or health care professional if you are unable to keep an appointment. What may interact with this medicine? This medicine may interact with the following medications: -amiodarone -amphotericin B -azathioprine -certain antiviral medicines for HIV or AIDS such as protease inhibitors (e.g., indinavir,  ritonavir) and zidovudine -certain blood pressure medications such as benazepril, captopril, enalapril, fosinopril, lisinopril, moexipril, monopril, perindopril, quinapril, ramipril, trandolapril -certain cancer medications such as anthracyclines (e.g., daunorubicin, doxorubicin), busulfan, cytarabine, paclitaxel, pentostatin, tamoxifen, trastuzumab -certain diuretics such as chlorothiazide, chlorthalidone, hydrochlorothiazide, indapamide, metolazone -certain medicines that treat or prevent blood clots like warfarin -certain muscle relaxants such as succinylcholine -cyclosporine -etanercept -indomethacin -medicines to increase blood counts like filgrastim, pegfilgrastim, sargramostim -medicines used as general anesthesia -metronidazole -natalizumab This list may not describe all possible interactions. Give your health care provider a list of all the medicines, herbs, non-prescription drugs, or dietary supplements you use. Also tell them if you smoke, drink alcohol, or use illegal drugs. Some items may interact with your medicine. What should I watch for while using this medicine? Visit your doctor for checks on your progress. This drug may make you feel generally unwell. This is not uncommon, as chemotherapy can affect healthy cells as well as cancer cells. Report any side effects. Continue your course of treatment even though you feel ill unless your doctor tells you to stop. Drink water or other fluids as directed. Urinate often, even at night. In some cases, you may be given additional medicines to help with side effects. Follow all directions for their use. Call your doctor or health care professional for advice if you get a fever, chills or sore throat, or other symptoms of a cold or flu. Do not treat yourself. This drug decreases your body's ability to fight infections. Try to avoid being around people who are sick. This medicine may increase your risk to bruise or bleed. Call your doctor or  health care professional if you notice any unusual bleeding. Be careful brushing and flossing your teeth or using a toothpick because you may get an infection or bleed more easily. If you have any dental work done, tell your dentist you are receiving this medicine. You may get drowsy or dizzy. Do not drive, use machinery, or do anything that needs mental alertness until you know how this medicine affects you. Do not become pregnant while taking this medicine or for 1 year after stopping it. Women should inform their doctor if they wish to become pregnant or think they might be pregnant. Men should not father a child while taking this medicine and for 4 months after stopping it. There is a potential for serious side effects to an unborn child. Talk to your health care professional or pharmacist for more information. Do not breast-feed an infant while taking this medicine. This medicine may interfere with the ability to have a child. This medicine has caused ovarian failure in some women. This medicine has caused reduced sperm counts in some men. You should talk with your doctor or health care professional if you are concerned about your fertility. If you are going to have surgery, tell your doctor or health care professional that you have taken this medicine. What side effects may I notice from receiving this medicine? Side effects that you should report to your doctor or health care professional as   soon as possible: -allergic reactions like skin rash, itching or hives, swelling of the face, lips, or tongue -low blood counts - this medicine may decrease the number of white blood cells, red blood cells and platelets. You may be at increased risk for infections and bleeding. -signs of infection - fever or chills, cough, sore throat, pain or difficulty passing urine -signs of decreased platelets or bleeding - bruising, pinpoint red spots on the skin, black, tarry stools, blood in the urine -signs of  decreased red blood cells - unusually weak or tired, fainting spells, lightheadedness -breathing problems -dark urine -dizziness -palpitations -swelling of the ankles, feet, hands -trouble passing urine or change in the amount of urine -weight gain -yellowing of the eyes or skin Side effects that usually do not require medical attention (report to your doctor or health care professional if they continue or are bothersome): -changes in nail or skin color -hair loss -missed menstrual periods -mouth sores -nausea, vomiting This list may not describe all possible side effects. Call your doctor for medical advice about side effects. You may report side effects to FDA at 1-800-FDA-1088. Where should I keep my medicine? This drug is given in a hospital or clinic and will not be stored at home. NOTE: This sheet is a summary. It may not cover all possible information. If you have questions about this medicine, talk to your doctor, pharmacist, or health care provider.  2018 [employer]/Gold Standard (2012-02-28 16:22:58) Paclitaxel injection What is this medicine? PACLITAXEL (PAK li TAX el) is a chemotherapy drug. It targets fast dividing cells, like cancer cells, and causes these cells to die. This medicine is used to treat ovarian cancer, breast cancer, and other cancers. This medicine may be used for other purposes; ask your health care provider or pharmacist if you have questions. COMMON BRAND NAME(S): Onxol, Taxol What should I tell my health care provider before I take this medicine? They need to know if you have any of these conditions: -blood disorders -irregular heartbeat -infection (especially a virus infection such as chickenpox, cold sores, or herpes) -liver disease -previous or ongoing radiation therapy -an unusual or allergic reaction to paclitaxel, alcohol, polyoxyethylated castor oil, other chemotherapy agents, other medicines, foods, dyes, or preservatives -pregnant or trying to  get pregnant -breast-feeding How should I use this medicine? This drug is given as an infusion into a vein. It is administered in a hospital or clinic by a specially trained health care professional. Talk to your pediatrician regarding the use of this medicine in children. Special care may be needed. Overdosage: If you think you have taken too much of this medicine contact a poison control center or emergency room at once. NOTE: This medicine is only for you. Do not share this medicine with others. What if I miss a dose? It is important not to miss your dose. Call your doctor or health care professional if you are unable to keep an appointment. What may interact with this medicine? Do not take this medicine with any of the following medications: -disulfiram -metronidazole This medicine may also interact with the following medications: -cyclosporine -diazepam -ketoconazole -medicines to increase blood counts like filgrastim, pegfilgrastim, sargramostim -other chemotherapy drugs like cisplatin, doxorubicin, epirubicin, etoposide, teniposide, vincristine -quinidine -testosterone -vaccines -verapamil Talk to your doctor or health care professional before taking any of these medicines: -acetaminophen -aspirin -ibuprofen -ketoprofen -naproxen This list may not describe all possible interactions. Give your health care provider a list of all the medicines, herbs, non-prescription drugs, or   dietary supplements you use. Also tell them if you smoke, drink alcohol, or use illegal drugs. Some items may interact with your medicine. What should I watch for while using this medicine? Your condition will be monitored carefully while you are receiving this medicine. You will need important blood work done while you are taking this medicine. This medicine can cause serious allergic reactions. To reduce your risk you will need to take other medicine(s) before treatment with this medicine. If you  experience allergic reactions like skin rash, itching or hives, swelling of the face, lips, or tongue, tell your doctor or health care professional right away. In some cases, you may be given additional medicines to help with side effects. Follow all directions for their use. This drug may make you feel generally unwell. This is not uncommon, as chemotherapy can affect healthy cells as well as cancer cells. Report any side effects. Continue your course of treatment even though you feel ill unless your doctor tells you to stop. Call your doctor or health care professional for advice if you get a fever, chills or sore throat, or other symptoms of a cold or flu. Do not treat yourself. This drug decreases your body's ability to fight infections. Try to avoid being around people who are sick. This medicine may increase your risk to bruise or bleed. Call your doctor or health care professional if you notice any unusual bleeding. Be careful brushing and flossing your teeth or using a toothpick because you may get an infection or bleed more easily. If you have any dental work done, tell your dentist you are receiving this medicine. Avoid taking products that contain aspirin, acetaminophen, ibuprofen, naproxen, or ketoprofen unless instructed by your doctor. These medicines may hide a fever. Do not become pregnant while taking this medicine. Women should inform their doctor if they wish to become pregnant or think they might be pregnant. There is a potential for serious side effects to an unborn child. Talk to your health care professional or pharmacist for more information. Do not breast-feed an infant while taking this medicine. Men are advised not to father a child while receiving this medicine. This product may contain alcohol. Ask your pharmacist or healthcare provider if this medicine contains alcohol. Be sure to tell all healthcare providers you are taking this medicine. Certain medicines, like metronidazole  and disulfiram, can cause an unpleasant reaction when taken with alcohol. The reaction includes flushing, headache, nausea, vomiting, sweating, and increased thirst. The reaction can last from 30 minutes to several hours. What side effects may I notice from receiving this medicine? Side effects that you should report to your doctor or health care professional as soon as possible: -allergic reactions like skin rash, itching or hives, swelling of the face, lips, or tongue -low blood counts - This drug may decrease the number of white blood cells, red blood cells and platelets. You may be at increased risk for infections and bleeding. -signs of infection - fever or chills, cough, sore throat, pain or difficulty passing urine -signs of decreased platelets or bleeding - bruising, pinpoint red spots on the skin, black, tarry stools, nosebleeds -signs of decreased red blood cells - unusually weak or tired, fainting spells, lightheadedness -breathing problems -chest pain -high or low blood pressure -mouth sores -nausea and vomiting -pain, swelling, redness or irritation at the injection site -pain, tingling, numbness in the hands or feet -slow or irregular heartbeat -swelling of the ankle, feet, hands Side effects that usually do not   require medical attention (report to your doctor or health care professional if they continue or are bothersome): -bone pain -complete hair loss including hair on your head, underarms, pubic hair, eyebrows, and eyelashes -changes in the color of fingernails -diarrhea -loosening of the fingernails -loss of appetite -muscle or joint pain -red flush to skin -sweating This list may not describe all possible side effects. Call your doctor for medical advice about side effects. You may report side effects to FDA at 1-800-FDA-1088. Where should I keep my medicine? This drug is given in a hospital or clinic and will not be stored at home. NOTE: This sheet is a summary. It  may not cover all possible information. If you have questions about this medicine, talk to your doctor, pharmacist, or health care provider.  2018 [employer] Standard (2015-02-14 19:58:00)  

## 2017-03-11 ENCOUNTER — Encounter: Payer: Self-pay | Admitting: *Deleted

## 2017-03-11 ENCOUNTER — Inpatient Hospital Stay

## 2017-03-11 ENCOUNTER — Other Ambulatory Visit: Payer: Self-pay | Admitting: Internal Medicine

## 2017-03-11 DIAGNOSIS — C50211 Malignant neoplasm of upper-inner quadrant of right female breast: Secondary | ICD-10-CM

## 2017-03-11 DIAGNOSIS — Z171 Estrogen receptor negative status [ER-]: Principal | ICD-10-CM

## 2017-03-11 DIAGNOSIS — Z006 Encounter for examination for normal comparison and control in clinical research program: Secondary | ICD-10-CM

## 2017-03-11 NOTE — Progress Notes (Signed)
Please schedule lab [cbc/beta-hcg/ bmp-ordered] & zoladex asap. Plan with chemo/follow up with me as planned.

## 2017-03-11 NOTE — Progress Notes (Signed)
  Oncology Nurse Navigator Documentation  Navigator Location: [facility] (03/11/17 1345)   )Navigator Encounter Type: Telephone (03/11/17 1345)                       Treatment Phase: Pre-Tx/Tx Discussion (03/11/17 1345) Barriers/Navigation Needs: Coordination of Care;Education (03/11/17 1345)   Interventions: Coordination of Care (03/11/17 1345)                      Time Spent with Patient: 30 (03/11/17 1345)   Patient called with questions regarding prescription for a wig.  Informed her Dr. Rogue Bussing would give her a prescription or she could get a free wig in the gift shop if she found something she wanted.  She also stated that her and husband did not want to decide at this time if they wanted more children in the future, and Dr. Rogue Bussing had discussed an injection to protect her eggs.  Confirmed that conversation and told her I would discuss with Dr. B and get back to her.  Talked to Dr. B and he said she would need Lupron 2 weeks prior to chemo.  Informed patient.  She is not sure she wants to delay her chemo.  She is going to discuss with her husband again and let us know what she decides.

## 2017-03-12 NOTE — Progress Notes (Signed)
Patient no longer wants Zoladex injection. Please have patient come in for labs/chemo & to see Dr. B as planned. Thanks!

## 2017-03-17 ENCOUNTER — Encounter: Payer: Self-pay | Admitting: Interventional Radiology

## 2017-03-17 ENCOUNTER — Ambulatory Visit
Admission: RE | Admit: 2017-03-17 | Discharge: 2017-03-17 | Disposition: A | Discharge status: Home / Self Care | Source: Ambulatory Visit | Attending: Internal Medicine | Admitting: Internal Medicine

## 2017-03-17 ENCOUNTER — Inpatient Hospital Stay

## 2017-03-17 DIAGNOSIS — C50211 Malignant neoplasm of upper-inner quadrant of right female breast: Secondary | ICD-10-CM | POA: Insufficient documentation

## 2017-03-17 DIAGNOSIS — M797 Fibromyalgia: Secondary | ICD-10-CM | POA: Insufficient documentation

## 2017-03-17 DIAGNOSIS — Z87891 Personal history of nicotine dependence: Secondary | ICD-10-CM | POA: Diagnosis not present

## 2017-03-17 DIAGNOSIS — J45909 Unspecified asthma, uncomplicated: Secondary | ICD-10-CM | POA: Diagnosis not present

## 2017-03-17 DIAGNOSIS — Z171 Estrogen receptor negative status [ER-]: Principal | ICD-10-CM

## 2017-03-17 HISTORY — PX: IR IMAGING GUIDED PORT INSERTION: IMG5740

## 2017-03-17 MED ORDER — CEFAZOLIN SODIUM-DEXTROSE 2-4 GM/100ML-% IV SOLN
INTRAVENOUS | Status: AC
  Filled 2017-03-17: qty 100

## 2017-03-17 MED ORDER — FENTANYL CITRATE (PF) 100 MCG/2ML IJ SOLN
50.0000 ug | Freq: Once | INTRAMUSCULAR | Status: AC
  Administered 2017-03-17: 11:00:00 50 ug via INTRAVENOUS

## 2017-03-17 MED ORDER — MIDAZOLAM HCL 5 MG/5ML IJ SOLN
INTRAMUSCULAR | Status: AC
  Filled 2017-03-17: qty 5

## 2017-03-17 MED ORDER — MIDAZOLAM HCL 2 MG/2ML IJ SOLN
INTRAMUSCULAR | Status: DC | PRN
  Administered 2017-03-17: 2 mg via INTRAVENOUS

## 2017-03-17 MED ORDER — SODIUM CHLORIDE 0.9 % IV SOLN
INTRAVENOUS | Status: DC
Start: 2017-03-17 — End: 2017-03-18
  Administered 2017-03-17: 11:00:00 via INTRAVENOUS

## 2017-03-17 MED ORDER — FENTANYL CITRATE (PF) 100 MCG/2ML IJ SOLN
INTRAMUSCULAR | Status: AC
Start: 2017-03-17 — End: 2017-03-17
  Filled 2017-03-17: qty 2

## 2017-03-17 MED ORDER — MIDAZOLAM HCL 2 MG/2ML IJ SOLN
2.0000 mg | Freq: Once | INTRAMUSCULAR | Status: AC
  Administered 2017-03-17: 11:00:00 2 mg via INTRAVENOUS

## 2017-03-17 MED ORDER — LIDOCAINE HCL (PF) 1 % IJ SOLN
INTRAMUSCULAR | Status: AC
  Filled 2017-03-17: qty 30

## 2017-03-17 MED ORDER — HEPARIN SOD (PORK) LOCK FLUSH 100 UNIT/ML IV SOLN
INTRAVENOUS | Status: AC
  Filled 2017-03-17: qty 5

## 2017-03-17 MED ORDER — DEXTROSE 5 % IV SOLN
2.0000 g | Freq: Once | INTRAVENOUS | Status: AC
  Administered 2017-03-17: 11:00:00 2 g via INTRAVENOUS
  Filled 2017-03-17: qty 20

## 2017-03-17 MED ORDER — LIDOCAINE HCL 1 % IJ SOLN
INTRAMUSCULAR | Status: DC | PRN
  Administered 2017-03-17: 20 mL

## 2017-03-17 NOTE — Progress Notes (Signed)
Patient here today for port placement per Dr Kathlene Cote, attached to monitor and will follow vitals throughout entire procedure.

## 2017-03-17 NOTE — Discharge Instructions (Signed)
Moderate Conscious Sedation, Adult, Care After °These instructions provide you with information about caring for yourself after your procedure. Your health care provider may also give you more specific instructions. Your treatment has been planned according to current medical practices, but problems sometimes occur. Call your health care provider if you have any problems or questions after your procedure. °What can I expect after the procedure? °After your procedure, it is common: °· To feel sleepy for several hours. °· To feel clumsy and have poor balance for several hours. °· To have poor judgment for several hours. °· To vomit if you eat too soon. ° °Follow these instructions at home: °For at least 24 hours after the procedure: ° °· Do not: °? Participate in activities where you could fall or become injured. °? Drive. °? Use heavy machinery. °? Drink alcohol. °? Take sleeping pills or medicines that cause drowsiness. °? Make important decisions or sign legal documents. °? Take care of children on your own. °· Rest. °Eating and drinking °· Follow the diet recommended by your health care provider. °· If you vomit: °? Drink water, juice, or soup when you can drink without vomiting. °? Make sure you have little or no nausea before eating solid foods. °General instructions °· Have a responsible adult stay with you until you are awake and alert. °· Take over-the-counter and prescription medicines only as told by your health care provider. °· If you smoke, do not smoke without supervision. °· Keep all follow-up visits as told by your health care provider. This is important. °Contact a health care provider if: °· You keep feeling nauseous or you keep vomiting. °· You feel light-headed. °· You develop a rash. °· You have a fever. °Get help right away if: °· You have trouble breathing. °This information is not intended to replace advice given to you by your health care provider. Make sure you discuss any questions you have  with your health care provider. °Document Released: 02/03/2013 Document Revised: 09/18/2015 Document Reviewed: 08/05/2015 °Elsevier Interactive Patient Education © 2018 [employer] °Tunneled Catheter Insertion, Care After °Refer to this sheet in the next few weeks. These instructions provide you with information about caring for yourself after your procedure. Your health care provider may also give you more specific instructions. Your treatment has been planned according to current medical practices, but problems sometimes occur. Call your health care provider if you have any problems or questions after your procedure. °What can I expect after the procedure? °After the procedure, it is common to have: °· Some mild redness, swelling, and pain around your catheter site. °· A small amount of blood or clear fluid coming from your incisions. ° °Follow these instructions at home: °Incision care °· Check your incision areas every day for signs of infection. Check for: °? More redness, swelling, or pain. °? More fluid or blood. °? Warmth. °? Pus or a bad smell. °· Follow instructions from your health care provider about how to take care of your incisions. Make sure you: °? Wash your hands with soap and water before you change your bandages (dressings). If soap and water are not available, use hand sanitizer. °? Change your dressings as told by your health care provider. Wash the area around your incisions with a germ-killing (antiseptic) solution when you change your dressing, as told by your health care provider. °? Leave stitches (sutures), skin glue, or adhesive strips in place. These skin closures may need to stay in place for 2 weeks or   longer. If adhesive strip edges start to loosen and curl up, you may trim the loose edges. Do not remove adhesive strips completely unless your health care provider tells you to do that. °Catheter Care ° °· Wash your hands with soap and water before and after caring for your catheter.  If soap and water are not available, use hand sanitizer. °· Keep your catheter site and your dressings clean and dry. °· Apply an antibiotic ointment to your catheter site as told by your health care provider. °· Flush your catheter as told by your health care provider. This helps prevent it from becoming clogged. °· Do not open the caps on the ends of the catheter. °· Do not pull on your catheter. °· If your catheter is in your arm: °? Avoid wearing tight clothes or tight jewelry on your arm that has the catheter. °? Do not sleep with your head on the arm that has the catheter. °? Do not allow your blood pressure to be taken on the arm that has the catheter. °? Do not allow your blood to be drawn from the arm that has the catheter, except through the catheter itself. °Medicines °· Take over-the-counter and prescription medicines only as told by your health care provider. °· If you were prescribed an antibiotic medicine, take it as told by your health care provider. Do not stop taking the antibiotic even if you start to feel better. °Activity °· Return to your normal activities as told by your health care provider. Ask your health care provider what activities are safe for you. °· Do not lift anything that is heavier than 10 lb (4.5 kg) for 3 weeks or as long as told by your health care provider. °Driving °· Do not drive until your health care provider approves. °· Do not drive or operate heavy machinery while taking prescription pain medicine. °General instructions °· Follow your health care provider's specific instructions for the type of catheter that you have. °· Do not take baths, swim, or use a hot tub until your health care provider approves. °· Follow instructions from your health care provider about eating or drinking restrictions. °· Wear compression stockings as told by your health care provider. These stockings help to prevent blood clots and reduce swelling in your legs. °· Keep all follow-up visits as  told by your health care provider. This is important. °Contact a health care provider if: °· You have more fluid or blood coming from your incisions. °· You have more redness, swelling, or pain at your incisions or around the area where your catheter is inserted. °· Your incisions feel warm to the touch. °· You feel unusually weak. °· You feel nauseous. °· Your catheter is not working properly. °· You have blood or fluid draining from your catheter. °· You are unable to flush your catheter. °Get help right away if: °· Your catheter breaks. °· A hole develops in your catheter. °· Your catheter comes loose or gets pulled completely out. If this happens, press on your catheter site firmly with your hand or a clean cloth until you get medical help. °· Your catheter becomes blocked. °· You have swelling in your arm, shoulder, neck, or face. °· You develop chest pain. °· You have difficulty breathing. °· You feel dizzy or light-headed. °· You have pus or a bad smell coming from your incisions. °· You have a fever. °· You develop bleeding from your catheter or your insertion site, and your bleeding does not   stop. °This information is not intended to replace advice given to you by your health care provider. Make sure you discuss any questions you have with your health care provider. °Document Released: 04/01/2012 Document Revised: 12/17/2015 Document Reviewed: 01/09/2015 °Elsevier Interactive Patient Education © 2018 [employer] ° °

## 2017-03-17 NOTE — Procedures (Addendum)
Interventional Radiology Procedure Note  Procedure: Placement of a left IJ approach single lumen PowerPort.  Tip is positioned at the superior cavoatrial junction and catheter is ready for immediate use.  Complications: No immediate Recommendations:  - Ok to shower tomorrow - Do not submerge for 7 days - Routine line care   Esdras Delair T. Kathlene Cote, M.D Pager:  [phone]

## 2017-03-17 NOTE — H&P (Signed)
Chief Complaint: Patient was seen in consultation today for port placement at the request of Holly Parker  Referring Physician(s): Holly Parker  Patient Status: [facility] - Out-pt  History of Present Illness: Holly Parker is a 36 y.o. 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  Here for port placement to begin chemotherapy. Currently asymptomatic.  Past Medical History:  Diagnosis Date  . Asthma    Excercise induced  . Fibromyalgia     Past Surgical History:  Procedure Laterality Date  . KNEE SURGERY Left    Torn meniscus   . RHINOPLASTY      Allergies: Patient has no known allergies.  Medications: Prior to Admission medications   Medication Sig Start Date End Date Taking? Authorizing Provider  aspirin-acetaminophen-caffeine (EXCEDRIN MIGRAINE) [phone] MG per tablet Take 1 tablet by mouth as needed for headache.   Yes [provider]  loratadine (CLARITIN) 10 MG tablet Take 10 mg daily by mouth.   Yes [provider]  Pseudoephedrine-Naproxen Na (ALEVE-D SINUS & HEADACHE PO) Take 1 each by mouth as needed.   Yes [provider]  lidocaine-prilocaine (EMLA) cream Apply 1 application as needed topically. Apply generously over the Mediport 45 minutes prior to chemotherapy. Patient not taking: Reported on 03/17/2017 03/04/17   Cammie Sickle, MD  ondansetron (ZOFRAN) 8 MG tablet One pill every 8 hours as needed for nausea/vomitting. Patient not taking: Reported on 03/17/2017 03/04/17   Cammie Sickle, MD  prochlorperazine (COMPAZINE) 10 MG tablet Take 1 tablet (10 mg total) every 6 (six) hours as needed by mouth for nausea or vomiting. Patient not taking: Reported on 03/17/2017 03/04/17   Cammie Sickle, MD  sertraline (ZOLOFT) 100 MG tablet Take 100 mg by mouth. 01/17/14   [provider]     Family History  Problem Relation Age of Onset  . Cancer  Mother        Ovarian cancer (2003), breast cancer(2017) & squamous cell   . Leukemia Mother   . Cancer Sister        breast cancer  . Melanoma Father   . Lung cancer Paternal Grandmother     Social History   Socioeconomic History  . Marital status: Married    Spouse name: Not on file  . Number of children: Not on file  . Years of education: Not on file  . Highest education level: Not on file  Social Needs  . Financial resource strain: Not on file  . Food insecurity - worry: Not on file  . Food insecurity - inability: Not on file  . Transportation needs - medical: Not on file  . Transportation needs - non-medical: Not on file  Occupational History  . Not on file  Tobacco Use  . Smoking status: Former Smoker    Last attempt to quit: 09/16/2011    Years since quitting: 5.5  Substance and Sexual Activity  . Alcohol use: No  . Drug use: No  . Sexual activity: Yes  Other Topics Concern  . Not on file  Social History Narrative  . Not on file    ECOG Status: 0 - Asymptomatic  Review of Systems: A 12 point ROS discussed and pertinent positives are indicated in the HPI above.  All other systems are negative.  Review of Systems  Constitutional: Negative.   Respiratory: Negative.   Cardiovascular: Negative.   Gastrointestinal: Negative.   Genitourinary: Negative.   Musculoskeletal: Negative.   Neurological:  Negative.   Hematological: Negative.     Vital Signs: BP 110/78   Pulse 68   Temp (!) 101.1 F (38.4 C) (Oral)   Resp 20   Ht '5\' 7"'  (1.702 m)   Wt 143 lb (64.9 kg)   LMP 02/27/2017 Comment: spoke with Dr Kathlene Cote regarding pt just finish cycle, no labs ordered at this time  SpO2 98%   BMI 22.40 kg/m   Physical Exam  Constitutional: She is oriented to person, place, and time. She appears well-developed. No distress.  HENT:  Head: Normocephalic and atraumatic.  Neck: Neck supple. No JVD present. No thyromegaly present.  Cardiovascular: Normal rate,  regular rhythm and normal heart sounds. Exam reveals no gallop and no friction rub.  No murmur heard. Pulmonary/Chest: Effort normal and breath sounds normal. No stridor. No respiratory distress. She has no wheezes. She has no rales.  Abdominal: Soft. Bowel sounds are normal. She exhibits no distension and no mass. There is no tenderness. There is no guarding.  Musculoskeletal: She exhibits no edema.  Lymphadenopathy:    She has no cervical adenopathy.  Neurological: She is alert and oriented to person, place, and time.  Skin: Skin is warm and dry. She is not diaphoretic.  Vitals reviewed.   Imaging: Nm Cardiac Muga Rest  Result Date: 03/07/2017 CLINICAL DATA:  RIGHT breast cancer, pre cardiotoxic chemotherapy EXAM: NUCLEAR MEDICINE CARDIAC BLOOD POOL IMAGING (MUGA) TECHNIQUE: Cardiac multi-gated acquisition was performed at rest following intravenous injection of Tc-51mlabeled red blood cells. RADIOPHARMACEUTICALS:  23.81 mCi Tc-952mertechnetate UltraTag in-vitro labeled autologous red blood cells IV COMPARISON:  None FINDINGS: Calculated LEFT ventricular ejection fraction is 69% which is within the normal range. Patient was rhythmic during image acquisition. No focal wall motion abnormalities. IMPRESSION: Calculated LEFT ventricular ejection fraction of 69%. Normal LEFT ventricular wall motion. Electronically Signed   By: MaLavonia Dana.D.   On: 03/07/2017 16:13    Labs:  CBC: Recent Labs    03/03/17 1548  WBC 8.6  HGB 13.2  HCT 38.5  PLT 366    COAGS: No results for input(s): INR, APTT in the last 8760 hours.  BMP: Recent Labs    03/03/17 1548  NA 134*  K 4.1  CL 100*  CO2 27  GLUCOSE 88  BUN 15  CALCIUM 9.3  CREATININE 0.65  GFRNONAA >60  GFRAA >60    LIVER FUNCTION TESTS: Recent Labs    03/03/17 1548  BILITOT 0.5  AST 17  ALT 12*  ALKPHOS 69  PROT 7.9  ALBUMIN 4.5    Assessment and Plan:  For porta-cath placement today. Risks and benefits discussed  with the patient including, but not limited to bleeding, infection, pneumothorax, or fibrin sheath development and need for additional procedures. All of the patient's questions were answered, patient is agreeable to proceed. Consent signed and in chart.  Thank you for this interesting consult.  I greatly enjoyed meeting CaJOURNII NIERMANnd look forward to participating in their care.  A copy of this report was sent to the requesting provider on this date.  Electronically Signed: YAAzzie RoupMD 03/17/2017, 10:39 AM   I spent a total of 20 Minutes in face to face in clinical consultation, greater than 50% of which was counseling/coordinating care for port placement.

## 2017-03-18 ENCOUNTER — Ambulatory Visit (HOSPITAL_COMMUNITY)
Admission: RE | Admit: 2017-03-18 | Discharge: 2017-03-18 | Disposition: A | Discharge status: Home / Self Care | Source: Ambulatory Visit | Attending: Internal Medicine | Admitting: Internal Medicine

## 2017-03-18 DIAGNOSIS — Z006 Encounter for examination for normal comparison and control in clinical research program: Secondary | ICD-10-CM

## 2017-03-19 ENCOUNTER — Inpatient Hospital Stay

## 2017-03-19 ENCOUNTER — Encounter: Payer: Self-pay | Admitting: *Deleted

## 2017-03-19 ENCOUNTER — Inpatient Hospital Stay (HOSPITAL_BASED_OUTPATIENT_CLINIC_OR_DEPARTMENT_OTHER): Admitting: Internal Medicine

## 2017-03-19 VITALS — BP 118/80 | HR 94 | Temp 98.1°F | Resp 16 | Wt 145.0 lb

## 2017-03-19 DIAGNOSIS — Z171 Estrogen receptor negative status [ER-]: Secondary | ICD-10-CM

## 2017-03-19 DIAGNOSIS — Z87891 Personal history of nicotine dependence: Secondary | ICD-10-CM

## 2017-03-19 DIAGNOSIS — Z006 Encounter for examination for normal comparison and control in clinical research program: Secondary | ICD-10-CM | POA: Diagnosis not present

## 2017-03-19 DIAGNOSIS — C50211 Malignant neoplasm of upper-inner quadrant of right female breast: Secondary | ICD-10-CM | POA: Diagnosis not present

## 2017-03-19 DIAGNOSIS — Z79899 Other long term (current) drug therapy: Secondary | ICD-10-CM

## 2017-03-19 DIAGNOSIS — J45909 Unspecified asthma, uncomplicated: Secondary | ICD-10-CM | POA: Diagnosis not present

## 2017-03-19 DIAGNOSIS — Z5111 Encounter for antineoplastic chemotherapy: Secondary | ICD-10-CM | POA: Diagnosis not present

## 2017-03-19 DIAGNOSIS — M797 Fibromyalgia: Secondary | ICD-10-CM

## 2017-03-19 LAB — CBC WITH DIFFERENTIAL/PLATELET
BASOS ABS: 0 10*3/uL (ref 0–0.1)
Basophils Relative: 0 %
EOS ABS: 0.3 10*3/uL (ref 0–0.7)
EOS PCT: 3 %
HCT: 37.7 % (ref 35.0–47.0)
HEMOGLOBIN: 12.6 g/dL (ref 12.0–16.0)
Lymphocytes Relative: 18 %
Lymphs Abs: 1.5 10*3/uL (ref 1.0–3.6)
MCH: 30.9 pg (ref 26.0–34.0)
MCHC: 33.5 g/dL (ref 32.0–36.0)
MCV: 92.2 fL (ref 80.0–100.0)
Monocytes Absolute: 0.7 10*3/uL (ref 0.2–0.9)
Monocytes Relative: 9 %
NEUTROS PCT: 70 %
Neutro Abs: 5.9 10*3/uL (ref 1.4–6.5)
PLATELETS: 311 10*3/uL (ref 150–440)
RBC: 4.08 MIL/uL (ref 3.80–5.20)
RDW: 13.4 % (ref 11.5–14.5)
WBC: 8.5 10*3/uL (ref 3.6–11.0)

## 2017-03-19 LAB — BASIC METABOLIC PANEL
ANION GAP: 8 (ref 5–15)
BUN: 14 mg/dL (ref 6–20)
CO2: 21 mmol/L — ABNORMAL LOW (ref 22–32)
Calcium: 9 mg/dL (ref 8.9–10.3)
Chloride: 106 mmol/L (ref 101–111)
Creatinine, Ser: 0.67 mg/dL (ref 0.44–1.00)
Glucose, Bld: 132 mg/dL — ABNORMAL HIGH (ref 65–99)
POTASSIUM: 3.7 mmol/L (ref 3.5–5.1)
SODIUM: 135 mmol/L (ref 135–145)

## 2017-03-19 LAB — HCG, QUANTITATIVE, PREGNANCY

## 2017-03-19 MED ORDER — HEPARIN SOD (PORK) LOCK FLUSH 100 UNIT/ML IV SOLN
500.0000 [IU] | Freq: Once | INTRAVENOUS | Status: AC
  Administered 2017-03-19: 500 [IU] via INTRAVENOUS

## 2017-03-19 MED ORDER — PEGFILGRASTIM 6 MG/0.6ML ~~LOC~~ PSKT
6.0000 mg | PREFILLED_SYRINGE | Freq: Once | SUBCUTANEOUS | Status: AC
  Administered 2017-03-19: 6 mg via SUBCUTANEOUS
  Filled 2017-03-19: qty 0.6

## 2017-03-19 MED ORDER — DOXORUBICIN HCL CHEMO IV INJECTION 2 MG/ML
60.0000 mg/m2 | Freq: Once | INTRAVENOUS | Status: AC
  Administered 2017-03-19: 106 mg via INTRAVENOUS
  Filled 2017-03-19: qty 53

## 2017-03-19 MED ORDER — SODIUM CHLORIDE 0.9 % IV SOLN
1000.0000 mg | Freq: Once | INTRAVENOUS | Status: AC
  Administered 2017-03-19: 1000 mg via INTRAVENOUS
  Filled 2017-03-19: qty 50

## 2017-03-19 MED ORDER — SODIUM CHLORIDE 0.9 % IV SOLN
Freq: Once | INTRAVENOUS | Status: AC
  Administered 2017-03-19: 10:00:00 via INTRAVENOUS
  Filled 2017-03-19: qty 1000

## 2017-03-19 MED ORDER — SODIUM CHLORIDE 0.9 % IV SOLN
Freq: Once | INTRAVENOUS | Status: AC
  Administered 2017-03-19: 11:00:00 via INTRAVENOUS
  Filled 2017-03-19: qty 5

## 2017-03-19 MED ORDER — PALONOSETRON HCL INJECTION 0.25 MG/5ML
0.2500 mg | Freq: Once | INTRAVENOUS | Status: AC
  Administered 2017-03-19: 0.25 mg via INTRAVENOUS
  Filled 2017-03-19: qty 5

## 2017-03-19 NOTE — Progress Notes (Signed)
[facility] CONSULT NOTE  Patient Care Team: Patient, No Pcp Per as PCP - General (General Practice) Magrinat, Virgie Dad, MD as Consulting Physician (Oncology) Armandina Stammer, MD (Obstetrics and Gynecology) Dillingham, Loel Lofty, DO as Attending Physician (Plastic Surgery) Coralie Keens, MD as Consulting Physician (General Surgery) Cammie Sickle, MD as Consulting Physician (Internal Medicine)  CHIEF COMPLAINTS/PURPOSE OF CONSULTATION:  Breast cancer  #  Oncology History   # OCT 2018- RIGHT BREAST UIQ -[place]; TRIPLE NEGATIVE; ki-67-high [61m-1'O clock-Bx- proven; [place]]; another- 12'O clock-43m Not biopsied. [s/p Dr.Magrinaut; [place]]; cT1c(m) cN0;  G-3; ki-67- 80%  # 11/218- ddAC-Taxol+carbo  # BRCA-1 Gene mutated [mom-ovarian cancer-Stage IIIB/Brca; older sister- breast ca/brca-mutated]  # MUGA scan [nov 2018]- 67%; UPBEAT clinical trial     Malignant neoplasm of upper-inner quadrant of right breast in female, estrogen receptor negative ([place]   HISTORY OF PRESENTING ILLNESS:  [name]89.o.  female with a history of BRCA1 mutation; triple negative stage I breast cancer-is here to proceed with cycle #1 of neoadjuvant chemotherapy.  In the interim patient had a port placed.  Patient complains of mild pain at the port site.  In the interim patient also evaluated by surgery.  She denies any unusual headaches or shortness of breath or cough.  Last menstrual.  Was approximately 1 month ago.  ROS: A complete 10 point review of system is done which is negative except mentioned above in history of present illness  MEDICAL HISTORY:  Past Medical History:  Diagnosis Date  . Asthma    Excercise induced  . Fibromyalgia     SURGICAL HISTORY: Past Surgical History:  Procedure Laterality Date  . IR FLUORO GUIDE PORT INSERTION LEFT  03/17/2017  . KNEE SURGERY Left    Torn meniscus   . 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 Marital status: Married    Spouse name: Not on file  . Number of children: Not on file  . Years of education: Not on file  . Highest education level: Not on file  Social Needs  . Financial resource strain: Not on file  . Food insecurity - worry: Not on file  . Food insecurity - inability: Not on file  . Transportation needs - medical: Not on file  . Transportation needs - non-medical: Not on file  Occupational History  . Not on file  Tobacco Use  . Smoking status: Former Smoker    Last attempt to quit: 09/16/2011    Years since quitting: 5.5  Substance and Sexual Activity  . Alcohol use: No  . Drug use: No  . Sexual activity: Yes  Other Topics Concern  . Not on file  Social History Narrative  . Not on file    FAMILY HISTORY: Family History  Problem Relation Age of Onset  . Cancer Mother        Ovarian cancer (2003), breast cancer(2017) & squamous cell   . Leukemia Mother   . Cancer Sister        breast cancer  . Melanoma Father   . Lung cancer Paternal Grandmother     ALLERGIES:  has No Known Allergies.  MEDICATIONS:  Current Outpatient Medications  Medication Sig Dispense Refill  . aspirin-acetaminophen-caffeine (EXCEDRIN MIGRAINE) 250-250-65 MG per tablet Take 1 tablet by mouth as needed for headache.    . lidocaine-prilocaine (EMLA) cream Apply 1 application  as needed topically. Apply generously over the Mediport 45 minutes prior to chemotherapy. 30 g 0  . loratadine (CLARITIN) 10 MG tablet Take 10 mg daily by mouth.    . Pseudoephedrine-Naproxen Na (ALEVE-D SINUS & HEADACHE PO) Take 1 each by mouth as needed.    . sertraline (ZOLOFT) 100 MG tablet Take 100 mg by mouth.    . ondansetron (ZOFRAN) 8 MG tablet One pill every 8 hours as needed for nausea/vomitting. (Patient not taking: Reported on 03/17/2017) 40 tablet 1  . prochlorperazine (COMPAZINE) 10 MG tablet Take 1 tablet (10  mg total) every 6 (six) hours as needed by mouth for nausea or vomiting. (Patient not taking: Reported on 03/17/2017) 40 tablet 1   No current facility-administered medications for this visit.       Marland Kitchen  PHYSICAL EXAMINATION: ECOG PERFORMANCE STATUS: 0 - Asymptomatic  Vitals:   03/19/17 0854  BP: 118/80  Pulse: 94  Resp: 16  Temp: 98.1 F (36.7 C)   Filed Weights   03/19/17 0854  Weight: 145 lb (65.8 kg)    GENERAL: Well-nourished well-developed; Alert, no distress and comfortable.   Accompanied by her husband. EYES: no pallor or icterus OROPHARYNX: no thrush or ulceration; good dentition  NECK: supple, no masses felt LYMPH:  no palpable lymphadenopathy in the cervical, axillary or inguinal regions LUNGS: clear to auscultation and  No wheeze or crackles HEART/CVS: regular rate & rhythm and no murmurs; No lower extremity edema ABDOMEN: abdomen soft, non-tender and normal bowel sounds Musculoskeletal:no cyanosis of digits and no clubbing  PSYCH: alert & oriented x 3 with fluent speech NEURO: no focal motor/sensory deficits SKIN:  no rashes or significant lesions;  LABORATORY DATA:  I have reviewed the data as listed Lab Results  Component Value Date   WBC 8.5 03/19/2017   HGB 12.6 03/19/2017   HCT 37.7 03/19/2017   MCV 92.2 03/19/2017   PLT 311 03/19/2017   Recent Labs    03/03/17 1548 03/19/17 0838  NA 134* 135  K 4.1 3.7  CL 100* 106  CO2 27 21*  GLUCOSE 88 132*  BUN 15 14  CREATININE 0.65 0.67  CALCIUM 9.3 9.0  GFRNONAA >60 >60  GFRAA >60 >60  PROT 7.9  --   ALBUMIN 4.5  --   AST 17  --   ALT 12*  --   ALKPHOS 69  --   BILITOT 0.5  --     RADIOGRAPHIC STUDIES: I have personally reviewed the radiological images as listed and agreed with the findings in the report. Nm Cardiac Muga Rest  Result Date: 03/07/2017 CLINICAL DATA:  RIGHT breast cancer, pre cardiotoxic chemotherapy EXAM: NUCLEAR MEDICINE CARDIAC BLOOD POOL IMAGING (MUGA) TECHNIQUE:  Cardiac multi-gated acquisition was performed at rest following intravenous injection of Tc-73mlabeled red blood cells. RADIOPHARMACEUTICALS:  23.81 mCi [id number] UltraTag in-vitro labeled autologous red blood cells IV COMPARISON:  None FINDINGS: Calculated LEFT ventricular ejection fraction is 69% which is within the normal range. Patient was rhythmic during image acquisition. No focal wall motion abnormalities. IMPRESSION: Calculated LEFT ventricular ejection fraction of 69%. Normal LEFT ventricular wall motion. Electronically Signed   By: MaLavonia Dana.D.   On: 03/07/2017 16:13   Ir Fluoro Guide Port Insertion Left  Result Date: 03/17/2017 CLINICAL DATA:  Invasive ductal carcinoma of the right breast and need for porta cath to begin chemotherapy. EXAM: IMPLANTED PORT A CATH PLACEMENT WITH ULTRASOUND AND FLUOROSCOPIC GUIDANCE ANESTHESIA/SEDATION: 4.0 mg IV Versed; 100 mcg  IV Fentanyl Total Moderate Sedation Time:  32 minutes The patient's level of consciousness and physiologic status were continuously monitored during the procedure by Radiology nursing. Additional Medications: 2 g IV Ancef. FLUOROSCOPY TIME:  24 seconds.  2.0 mGy. PROCEDURE: The procedure, risks, benefits, and alternatives were explained to the patient. Questions regarding the procedure were encouraged and answered. The patient understands and consents to the procedure. A time-out was performed prior to initiating the procedure. Ultrasound was utilized to confirm patency of the left internal jugular vein. The left neck and chest were prepped with chlorhexidine in a sterile fashion, and a sterile drape was applied covering the operative field. Maximum barrier sterile technique with sterile gowns and gloves were used for the procedure. Local anesthesia was provided with 1% lidocaine. After creating a small venotomy incision, a 21 gauge needle was advanced into the left internal jugular vein under direct, real-time ultrasound  guidance. Ultrasound image documentation was performed. After securing guidewire access, an 8 Fr dilator was placed. A J-wire was kinked to measure appropriate catheter length. A subcutaneous port pocket was then created along the upper chest wall utilizing sharp and blunt dissection. Portable cautery was utilized. The pocket was irrigated with sterile saline. A single lumen power injectable port was chosen for placement. The 8 Fr catheter was tunneled from the port pocket site to the venotomy incision. The port was placed in the pocket. External catheter was trimmed to appropriate length based on guidewire measurement. At the venotomy, an 8 Fr peel-away sheath was placed over a guidewire. The catheter was then placed through the sheath and the sheath removed. Final catheter positioning was confirmed and documented with a fluoroscopic spot image. The port was accessed with a needle and aspirated and flushed with heparinized saline. The access needle was removed. The venotomy and port pocket incisions were closed with subcutaneous 3-0 Monocryl and subcuticular 4-0 Vicryl. Dermabond was applied to both incisions. COMPLICATIONS: COMPLICATIONS None FINDINGS: After catheter placement, the tip lies at the cavo-atrial junction. The catheter aspirates normally and is ready for immediate use. IMPRESSION: Placement of single lumen port a cath via left internal jugular vein. The catheter tip lies at the cavo-atrial junction. A power injectable port a cath was placed and is ready for immediate use. Electronically Signed   By: Aletta Edouard M.D.   On: 03/17/2017 13:17    ASSESSMENT & PLAN:   Malignant neoplasm of upper-inner quadrant of right breast in female, estrogen receptor negative ([place]) # Stage I ER/PR/HER-2/neu negative right breast cancer-given the logistics of surgery/insurance-reasonable to proceed with neoadjuvant chemotherapy.  #Proceed with cycle #1 of dose dense Adriamycin and Cytoxan today.  I again  reviewed the potential side effects in detail.  Reviewed that MUGA scan is within normal limits.  # Growth factor-Neulasta/On pro would be given as prophylaxis for chemotherapy-induced neutropenia to prevent febrile neutropenias. Discussed potential side effect- myalgias/arthralgias- recommend Claritin for 4 days.   #Patient is enrolled in UPBEAT study; discussed with clinical trials nurse.   #Patient will need also need bilateral salpingo-oophorectomy down the line.  # labs- 1 week; 2 weeks/MD/ infusion;onpro.   All questions were answered. The patient knows to call the clinic with any problems, questions or concerns.    Cammie Sickle, MD 03/20/2017 2:06 AM

## 2017-03-19 NOTE — Assessment & Plan Note (Addendum)
#  Stage I ER/PR/HER-2/neu negative right breast cancer-given the logistics of surgery/insurance-reasonable to proceed with neoadjuvant chemotherapy.  #Proceed with cycle #1 of dose dense Adriamycin and Cytoxan today.  I again reviewed the potential side effects in detail.  Reviewed that MUGA scan is within normal limits.  # Growth factor-Neulasta/On pro would be given as prophylaxis for chemotherapy-induced neutropenia to prevent febrile neutropenias. Discussed potential side effect- myalgias/arthralgias- recommend Claritin for 4 days.   #Patient is enrolled in UPBEAT study; discussed with clinical trials nurse.   #Patient will need also need bilateral salpingo-oophorectomy down the line.  # labs- 1 week; 2 weeks/MD/ infusion;onpro.

## 2017-03-25 ENCOUNTER — Telehealth: Payer: Self-pay | Admitting: *Deleted

## 2017-03-25 NOTE — Telephone Encounter (Signed)
T/C made to patient to question how she managed following her first chemotherapy treatment. [name] states she was OK on Thanksgiving day, but the 2nd day following her chemotherapy she became sick, had a terrible headache and experienced nausea and fatigue with some shortness of breath. Reports she is feeling better today, but felt bad all weekend. She has questions about an OBGYN appointment she has this week. She is due for a PAP smear and questions whether it is OK to have this. Instructed that this is OK, but I will check with Dr. Rogue Bussing to be sure. States she has cancelled her dental appointment for next week and instructed that she is correct in doing this. Questions when she will need to do something else for the study, and I instructed her that she will have study labs due in one month, and we will do the Cardiac MRI, questionnaires and physical assessment at 3 months. [name], BSN, MHA, OCN 03/25/2017 11:23 AM

## 2017-03-26 ENCOUNTER — Inpatient Hospital Stay

## 2017-03-26 DIAGNOSIS — Z5111 Encounter for antineoplastic chemotherapy: Secondary | ICD-10-CM | POA: Diagnosis not present

## 2017-03-26 DIAGNOSIS — Z171 Estrogen receptor negative status [ER-]: Principal | ICD-10-CM

## 2017-03-26 DIAGNOSIS — C50211 Malignant neoplasm of upper-inner quadrant of right female breast: Secondary | ICD-10-CM

## 2017-03-26 LAB — CBC WITH DIFFERENTIAL/PLATELET
Basophils Absolute: 0 10*3/uL (ref 0–0.1)
Basophils Relative: 1 %
EOS PCT: 7 %
Eosinophils Absolute: 0.2 10*3/uL (ref 0–0.7)
HEMATOCRIT: 34 % — AB (ref 35.0–47.0)
HEMOGLOBIN: 11.5 g/dL — AB (ref 12.0–16.0)
LYMPHS ABS: 0.9 10*3/uL — AB (ref 1.0–3.6)
LYMPHS PCT: 33 %
MCH: 31.3 pg (ref 26.0–34.0)
MCHC: 33.9 g/dL (ref 32.0–36.0)
MCV: 92.4 fL (ref 80.0–100.0)
MONOS PCT: 6 %
Monocytes Absolute: 0.2 10*3/uL (ref 0.2–0.9)
NEUTROS ABS: 1.5 10*3/uL (ref 1.4–6.5)
Neutrophils Relative %: 53 %
Platelets: 180 10*3/uL (ref 150–440)
RBC: 3.68 MIL/uL — ABNORMAL LOW (ref 3.80–5.20)
RDW: 12.8 % (ref 11.5–14.5)
WBC: 2.8 10*3/uL — ABNORMAL LOW (ref 3.6–11.0)

## 2017-03-26 LAB — BASIC METABOLIC PANEL
Anion gap: 7 (ref 5–15)
BUN: 15 mg/dL (ref 6–20)
CHLORIDE: 102 mmol/L (ref 101–111)
CO2: 25 mmol/L (ref 22–32)
Calcium: 9.1 mg/dL (ref 8.9–10.3)
Creatinine, Ser: 0.55 mg/dL (ref 0.44–1.00)
GFR calc non Af Amer: >60 mL/min (ref >60)
GLUCOSE: 113 mg/dL — AB (ref 65–99)
POTASSIUM: 4.8 mmol/L (ref 3.5–5.1)
Sodium: 134 mmol/L — ABNORMAL LOW (ref 135–145)

## 2017-03-26 NOTE — Progress Notes (Signed)
Please see below documentation on Consent form note. Patient begins C1D1 AC this morning. Holly Parker returns to clinic this morning for her first cycle of chemotherapy. She has completed her self-administered study questionnaires and returned them as instructed. Patient reports that her port-a-cath is very tenderand she is tearful at not realizing she would have so much pain when it was accessed. Informed that this will improve with time. Following her appointment with Dr. Rogue Bussing, patient was administered the baseline neurocognitive tests.She was then taken to infusion for her first chemotherapy. Patient was issued a[employer] gift card for completing her baseline assessments. [name], BSN, MHA, OCN 03/19/2017 1:16 PM

## 2017-03-28 ENCOUNTER — Telehealth: Payer: Self-pay | Admitting: *Deleted

## 2017-03-28 ENCOUNTER — Encounter: Admitting: Oncology

## 2017-03-28 NOTE — Progress Notes (Deleted)
Symptom Management Consult note [address] [facility]  [phone] [phone]  Patient Care Team: Patient, No Pcp Per as PCP - General (General Practice) Magrinat, Virgie Dad, MD as Consulting Physician (Oncology) Armandina Stammer, MD (Obstetrics and Gynecology) Dillingham, Loel Lofty, DO as Attending Physician (Plastic Surgery) Coralie Keens, MD as Consulting Physician (General Surgery) Cammie Sickle, MD as Consulting Physician (Internal Medicine)   Name of the patient: Holly Parker  [id number]  1980/11/23   Date of visit: 03/28/17  Diagnosis- Malignant neoplasm of upper-inner quadrant of right breast in female, estrogen receptor negative   Chief complaint/ Reason for visit- Burning at port site  Heme/Onc history:  OCT 2018- RIGHT BREAST UIQ -[place]; TRIPLE NEGATIVE; ki-67-high [60m-1'O clock-Bx- proven; [place]]; another- 12'O clock-49m Not biopsied. [s/p Dr.Magrinaut; [place]]; cT1c(m) cN0;  G-3; ki-67- 80%  # 11/218- ddAC-Taxol+carbo  # BRCA-1 Gene mutated [mom-ovarian cancer-Stage IIIB/Brca; older sister- breast ca/brca-mutated]  # MUGA scan [nov 2018]- 67%; UPBEAT clinical trial  Interval history- Patient was last seen by Dr. BrRogue Bussingn 03/19/2017 for her first cycle of neoadjuvant chemotherapy. She had her port placed on 03/17/17 without incident and was feeling well. Follow-up imaging revealed placement of single lumen port a cath via left internal jugular vein. The catheter tip lies at the cavo-atrial junction. A power injectable port a cath was placed and is ready for immediate use. She was additionally given 2 gram Ancef prophalactically.   Two days after chemotherapy patient became sick and had a terrible headache with nausea and fatigue with occasional shortness of breath. This lasted for several days but began feeling much better one week out.  She returned 2 days ago for labs only. Labs indicated neutropenia (2.8) and  mild anemia (11.5). Otherwise, they were ok.   Today she complains of burning at her port site.   ECOG FS:{CHL ONC PSZC:[id number]}Review of systems- ROS   Current treatment- Dose dense Adriamycin and Cytoxan and neulasta  No Known Allergies   Past Medical History:  Diagnosis Date  . Asthma    Excercise induced  . Fibromyalgia      Past Surgical History:  Procedure Laterality Date  . IR FLUORO GUIDE PORT INSERTION LEFT  03/17/2017  . KNEE SURGERY Left    Torn meniscus   . RHINOPLASTY      Social History   Socioeconomic History  . Marital status: Married    Spouse name: Not on file  . Number of children: Not on file  . Years of education: Not on file  . Highest education level: Not on file  Social Needs  . Financial resource strain: Not on file  . Food insecurity - worry: Not on file  . Food insecurity - inability: Not on file  . Transportation needs - medical: Not on file  . Transportation needs - non-medical: Not on file  Occupational History  . Not on file  Tobacco Use  . Smoking status: Former Smoker    Last attempt to quit: 09/16/2011    Years since quitting: 5.5  Substance and Sexual Activity  . Alcohol use: No  . Drug use: No  . Sexual activity: Yes  Other Topics Concern  . Not on file  Social History Narrative  . Not on file    Family History  Problem Relation Age of Onset  . Cancer Mother        Ovarian cancer (2003), breast cancer(2017) & squamous cell   . Leukemia Mother   .  Cancer Sister        breast cancer  . Melanoma Father   . Lung cancer Paternal Grandmother      Current Outpatient Medications:  .  aspirin-acetaminophen-caffeine (EXCEDRIN MIGRAINE) 250-250-65 MG per tablet, Take 1 tablet by mouth as needed for headache., Disp: , Rfl:  .  lidocaine-prilocaine (EMLA) cream, Apply 1 application as needed topically. Apply generously over the Mediport 45 minutes prior to chemotherapy., Disp: 30 g, Rfl: 0 .  loratadine (CLARITIN) 10  MG tablet, Take 10 mg daily by mouth., Disp: , Rfl:  .  ondansetron (ZOFRAN) 8 MG tablet, One pill every 8 hours as needed for nausea/vomitting. (Patient not taking: Reported on 03/17/2017), Disp: 40 tablet, Rfl: 1 .  prochlorperazine (COMPAZINE) 10 MG tablet, Take 1 tablet (10 mg total) every 6 (six) hours as needed by mouth for nausea or vomiting. (Patient not taking: Reported on 03/17/2017), Disp: 40 tablet, Rfl: 1 .  Pseudoephedrine-Naproxen Na (ALEVE-D SINUS & HEADACHE PO), Take 1 each by mouth as needed., Disp: , Rfl:  .  sertraline (ZOLOFT) 100 MG tablet, Take 100 mg by mouth., Disp: , Rfl:   Physical exam: There were no vitals filed for this visit. Physical Exam   CMP Latest Ref Rng & Units 03/26/2017  Glucose 65 - 99 mg/dL 113(H)  BUN 6 - 20 mg/dL 15  Creatinine 0.44 - 1.00 mg/dL 0.55  Sodium 135 - 145 mmol/L 134(L)  Potassium 3.5 - 5.1 mmol/L 4.8  Chloride 101 - 111 mmol/L 102  CO2 22 - 32 mmol/L 25  Calcium 8.9 - 10.3 mg/dL 9.1  Total Protein 6.5 - 8.1 g/dL -  Total Bilirubin 0.3 - 1.2 mg/dL -  Alkaline Phos 38 - 126 U/L -  AST 15 - 41 U/L -  ALT 14 - 54 U/L -   CBC Latest Ref Rng & Units 03/26/2017  WBC 3.6 - 11.0 K/uL 2.8(L)  Hemoglobin 12.0 - 16.0 g/dL 11.5(L)  Hematocrit 35.0 - 47.0 % 34.0(L)  Platelets 150 - 440 K/uL 180    No images are attached to the encounter.  Nm Cardiac Muga Rest  Result Date: 03/07/2017 CLINICAL DATA:  RIGHT breast cancer, pre cardiotoxic chemotherapy EXAM: NUCLEAR MEDICINE CARDIAC BLOOD POOL IMAGING (MUGA) TECHNIQUE: Cardiac multi-gated acquisition was performed at rest following intravenous injection of Tc-74mlabeled red blood cells. RADIOPHARMACEUTICALS:  23.81 mCi [id number] UltraTag in-vitro labeled autologous red blood cells IV COMPARISON:  None FINDINGS: Calculated LEFT ventricular ejection fraction is 69% which is within the normal range. Patient was rhythmic during image acquisition. No focal wall motion abnormalities.  IMPRESSION: Calculated LEFT ventricular ejection fraction of 69%. Normal LEFT ventricular wall motion. Electronically Signed   By: MaLavonia Dana.D.   On: 03/07/2017 16:13   Ir Fluoro Guide Port Insertion Left  Result Date: 03/17/2017 CLINICAL DATA:  Invasive ductal carcinoma of the right breast and need for porta cath to begin chemotherapy. EXAM: IMPLANTED PORT A CATH PLACEMENT WITH ULTRASOUND AND FLUOROSCOPIC GUIDANCE ANESTHESIA/SEDATION: 4.0 mg IV Versed; 100 mcg IV Fentanyl Total Moderate Sedation Time:  32 minutes The patient's level of consciousness and physiologic status were continuously monitored during the procedure by Radiology nursing. Additional Medications: 2 g IV Ancef. FLUOROSCOPY TIME:  24 seconds.  2.0 mGy. PROCEDURE: The procedure, risks, benefits, and alternatives were explained to the patient. Questions regarding the procedure were encouraged and answered. The patient understands and consents to the procedure. A time-out was performed prior to initiating the procedure. Ultrasound was utilized  to confirm patency of the left internal jugular vein. The left neck and chest were prepped with chlorhexidine in a sterile fashion, and a sterile drape was applied covering the operative field. Maximum barrier sterile technique with sterile gowns and gloves were used for the procedure. Local anesthesia was provided with 1% lidocaine. After creating a small venotomy incision, a 21 gauge needle was advanced into the left internal jugular vein under direct, real-time ultrasound guidance. Ultrasound image documentation was performed. After securing guidewire access, an 8 Fr dilator was placed. A J-wire was kinked to measure appropriate catheter length. A subcutaneous port pocket was then created along the upper chest wall utilizing sharp and blunt dissection. Portable cautery was utilized. The pocket was irrigated with sterile saline. A single lumen power injectable port was chosen for placement. The 8 Fr  catheter was tunneled from the port pocket site to the venotomy incision. The port was placed in the pocket. External catheter was trimmed to appropriate length based on guidewire measurement. At the venotomy, an 8 Fr peel-away sheath was placed over a guidewire. The catheter was then placed through the sheath and the sheath removed. Final catheter positioning was confirmed and documented with a fluoroscopic spot image. The port was accessed with a needle and aspirated and flushed with heparinized saline. The access needle was removed. The venotomy and port pocket incisions were closed with subcutaneous 3-0 Monocryl and subcuticular 4-0 Vicryl. Dermabond was applied to both incisions. COMPLICATIONS: COMPLICATIONS None FINDINGS: After catheter placement, the tip lies at the cavo-atrial junction. The catheter aspirates normally and is ready for immediate use. IMPRESSION: Placement of single lumen port a cath via left internal jugular vein. The catheter tip lies at the cavo-atrial junction. A power injectable port a cath was placed and is ready for immediate use. Electronically Signed   By: Aletta Edouard M.D.   On: 03/17/2017 13:17     Assessment and plan- Patient is a 36 y.o. female ***   Visit Diagnosis No diagnosis found.  Patient expressed understanding and was in agreement with this plan. She also understands that She can call clinic at any time with any questions, concerns, or complaints.   Greater than 50% was spent in counseling and coordination of care with this patient including but not limited to discussion of the relevant topics above (See A&P) including, but not limited to diagnosis and management of acute and chronic medical conditions.    Faythe Casa, AGNP-C [facility] [place]- [phone] Pager- [phone] 03/28/2017 1:34 PM

## 2017-03-28 NOTE — Telephone Encounter (Signed)
Patient did not show for appt today. Pt placed on schedule without being notified. I personally have asked Scheduling to follow-up and schedule this apt asap on Monday.

## 2017-03-28 NOTE — Telephone Encounter (Signed)
Per [facility] patient reports burning pain at port site. Pain worse when pt's bend over-forward.   Spoke with Dr. Dietrich Pates needs to be evaluated today by symptom mgmt NP.  msg sent to sch. Team to contact pt today with an apt with symptom mgmt NP.

## 2017-03-31 ENCOUNTER — Encounter: Admitting: Oncology

## 2017-03-31 NOTE — Telephone Encounter (Signed)
Reviewed chart- Patient CNL today's apt with NP. Note on chart that "has appt at another facility-port area still sore-will come for 12-5-appt"

## 2017-04-02 ENCOUNTER — Inpatient Hospital Stay: Attending: Internal Medicine

## 2017-04-02 ENCOUNTER — Inpatient Hospital Stay

## 2017-04-02 ENCOUNTER — Inpatient Hospital Stay (HOSPITAL_BASED_OUTPATIENT_CLINIC_OR_DEPARTMENT_OTHER): Admitting: Internal Medicine

## 2017-04-02 VITALS — BP 110/75 | HR 82 | Temp 98.1°F | Resp 16 | Wt 142.8 lb

## 2017-04-02 DIAGNOSIS — M797 Fibromyalgia: Secondary | ICD-10-CM | POA: Insufficient documentation

## 2017-04-02 DIAGNOSIS — Z5111 Encounter for antineoplastic chemotherapy: Secondary | ICD-10-CM | POA: Insufficient documentation

## 2017-04-02 DIAGNOSIS — J45909 Unspecified asthma, uncomplicated: Secondary | ICD-10-CM | POA: Insufficient documentation

## 2017-04-02 DIAGNOSIS — Z87891 Personal history of nicotine dependence: Secondary | ICD-10-CM | POA: Diagnosis not present

## 2017-04-02 DIAGNOSIS — Z8041 Family history of malignant neoplasm of ovary: Secondary | ICD-10-CM

## 2017-04-02 DIAGNOSIS — Z171 Estrogen receptor negative status [ER-]: Secondary | ICD-10-CM | POA: Diagnosis not present

## 2017-04-02 DIAGNOSIS — Z79899 Other long term (current) drug therapy: Secondary | ICD-10-CM | POA: Diagnosis not present

## 2017-04-02 DIAGNOSIS — R51 Headache: Secondary | ICD-10-CM | POA: Diagnosis not present

## 2017-04-02 DIAGNOSIS — Z801 Family history of malignant neoplasm of trachea, bronchus and lung: Secondary | ICD-10-CM | POA: Diagnosis not present

## 2017-04-02 DIAGNOSIS — C50211 Malignant neoplasm of upper-inner quadrant of right female breast: Secondary | ICD-10-CM

## 2017-04-02 DIAGNOSIS — Z803 Family history of malignant neoplasm of breast: Secondary | ICD-10-CM | POA: Insufficient documentation

## 2017-04-02 DIAGNOSIS — Z006 Encounter for examination for normal comparison and control in clinical research program: Secondary | ICD-10-CM | POA: Diagnosis not present

## 2017-04-02 DIAGNOSIS — R110 Nausea: Secondary | ICD-10-CM

## 2017-04-02 DIAGNOSIS — Z806 Family history of leukemia: Secondary | ICD-10-CM | POA: Diagnosis not present

## 2017-04-02 LAB — CBC WITH DIFFERENTIAL/PLATELET
BASOS ABS: 0.2 10*3/uL — AB (ref 0–0.1)
BASOS PCT: 1 %
EOS ABS: 0.2 10*3/uL (ref 0–0.7)
Eosinophils Relative: 1 %
HEMATOCRIT: 34.3 % — AB (ref 35.0–47.0)
HEMOGLOBIN: 11.5 g/dL — AB (ref 12.0–16.0)
Lymphocytes Relative: 17 %
Lymphs Abs: 2.2 10*3/uL (ref 1.0–3.6)
MCH: 31.1 pg (ref 26.0–34.0)
MCHC: 33.5 g/dL (ref 32.0–36.0)
MCV: 92.9 fL (ref 80.0–100.0)
MONOS PCT: 7 %
Monocytes Absolute: 0.9 10*3/uL (ref 0.2–0.9)
NEUTROS ABS: 9.8 10*3/uL — AB (ref 1.4–6.5)
NEUTROS PCT: 74 %
Platelets: 247 10*3/uL (ref 150–440)
RBC: 3.69 MIL/uL — AB (ref 3.80–5.20)
RDW: 13 % (ref 11.5–14.5)
WBC: 13.3 10*3/uL — AB (ref 3.6–11.0)

## 2017-04-02 LAB — COMPREHENSIVE METABOLIC PANEL
ALK PHOS: 97 U/L (ref 38–126)
ALT: 13 U/L — AB (ref 14–54)
ANION GAP: 8 (ref 5–15)
AST: 30 U/L (ref 15–41)
Albumin: 3.9 g/dL (ref 3.5–5.0)
BILIRUBIN TOTAL: 0.4 mg/dL (ref 0.3–1.2)
BUN: 16 mg/dL (ref 6–20)
CALCIUM: 9 mg/dL (ref 8.9–10.3)
CO2: 25 mmol/L (ref 22–32)
CREATININE: 0.67 mg/dL (ref 0.44–1.00)
Chloride: 101 mmol/L (ref 101–111)
Glucose, Bld: 120 mg/dL — ABNORMAL HIGH (ref 65–99)
Potassium: 4 mmol/L (ref 3.5–5.1)
SODIUM: 134 mmol/L — AB (ref 135–145)
TOTAL PROTEIN: 7.6 g/dL (ref 6.5–8.1)

## 2017-04-02 MED ORDER — PALONOSETRON HCL INJECTION 0.25 MG/5ML
0.2500 mg | Freq: Once | INTRAVENOUS | Status: AC
  Administered 2017-04-02: 0.25 mg via INTRAVENOUS
  Filled 2017-04-02: qty 5

## 2017-04-02 MED ORDER — SODIUM CHLORIDE 0.9% FLUSH
10.0000 mL | Freq: Once | INTRAVENOUS | Status: AC
  Administered 2017-04-02: 10 mL via INTRAVENOUS
  Filled 2017-04-02: qty 10

## 2017-04-02 MED ORDER — HEPARIN SOD (PORK) LOCK FLUSH 100 UNIT/ML IV SOLN
500.0000 [IU] | Freq: Once | INTRAVENOUS | Status: AC
  Administered 2017-04-02: 500 [IU] via INTRAVENOUS
  Filled 2017-04-02: qty 5

## 2017-04-02 MED ORDER — SODIUM CHLORIDE 0.9 % IV SOLN
1000.0000 mg | Freq: Once | INTRAVENOUS | Status: AC
  Administered 2017-04-02: 1000 mg via INTRAVENOUS
  Filled 2017-04-02: qty 50

## 2017-04-02 MED ORDER — SODIUM CHLORIDE 0.9 % IV SOLN
Freq: Once | INTRAVENOUS | Status: AC
  Administered 2017-04-02: 10:00:00 via INTRAVENOUS
  Filled 2017-04-02: qty 1000

## 2017-04-02 MED ORDER — DOXORUBICIN HCL CHEMO IV INJECTION 2 MG/ML
60.0000 mg/m2 | Freq: Once | INTRAVENOUS | Status: AC
  Administered 2017-04-02: 106 mg via INTRAVENOUS
  Filled 2017-04-02: qty 53

## 2017-04-02 MED ORDER — FOSAPREPITANT DIMEGLUMINE INJECTION 150 MG
Freq: Once | INTRAVENOUS | Status: AC
  Administered 2017-04-02: 10:00:00 via INTRAVENOUS
  Filled 2017-04-02: qty 5

## 2017-04-02 MED ORDER — PEGFILGRASTIM 6 MG/0.6ML ~~LOC~~ PSKT
6.0000 mg | PREFILLED_SYRINGE | Freq: Once | SUBCUTANEOUS | Status: AC
  Administered 2017-04-02: 6 mg via SUBCUTANEOUS
  Filled 2017-04-02: qty 0.6

## 2017-04-02 NOTE — Progress Notes (Signed)
[place] CONSULT NOTE  Patient Care Team: Patient, No Pcp Per as PCP - General (General Practice) Magrinat, Virgie Dad, MD as Consulting Physician (Oncology) Armandina Stammer, MD (Obstetrics and Gynecology) Dillingham, Loel Lofty, DO as Attending Physician (Plastic Surgery) Coralie Keens, MD as Consulting Physician (General Surgery) Cammie Sickle, MD as Consulting Physician (Internal Medicine)  CHIEF COMPLAINTS/PURPOSE OF CONSULTATION:  Breast cancer  #  Oncology History   # OCT 2018- RIGHT BREAST UIQ -[place]; TRIPLE NEGATIVE; ki-67-high [48m-1'O clock-Bx- proven; [place]]; another- 12'O clock-47m Not biopsied. [s/p Dr.Magrinaut; [place]]; cT1c(m) cN0;  G-3; ki-67- 80%  # 11/218- ddAC-Taxol+carbo  # BRCA-1 Gene mutated [mom-ovarian cancer-Stage IIIB/Brca; older sister- breast ca/brca-mutated]  # MUGA scan [nov 2018]- 67%; UPBEAT clinical trial     Malignant neoplasm of upper-inner quadrant of right breast in female, estrogen receptor negative ([place]   HISTORY OF PRESENTING ILLNESS:  [name]89.o.  female with a history of BRCA1 mutation; triple negative stage I breast cancer-currently on neoadjuvant chemotherapy status post dose dense-Adriamycin and Cytoxan is here for follow-up.  Patient complains of nausea without any significant vomiting approximately 4 days post chemotherapy.  Improved with antiemetics-Compazine.  She also complained of headache post chemotherapy; currently resolved.  She also complains of pain around the port site sharp stinging pain; currently improved/resolved.  Denies any sores in the mouth.  No skin rash.  ROS: A complete 10 point review of system is done which is negative except mentioned above in history of present illness  MEDICAL HISTORY:  Past Medical History:  Diagnosis Date  . Asthma    Excercise induced  . Fibromyalgia     SURGICAL HISTORY: Past Surgical History:  Procedure Laterality Date  . IR FLUORO  GUIDE PORT INSERTION LEFT  03/17/2017  . KNEE SURGERY Left    Torn meniscus   . 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 Marital status: Married    Spouse name: Not on file  . Number of children: Not on file  . Years of education: Not on file  . Highest education level: Not on file  Social Needs  . Financial resource strain: Not on file  . Food insecurity - worry: Not on file  . Food insecurity - inability: Not on file  . Transportation needs - medical: Not on file  . Transportation needs - non-medical: Not on file  Occupational History  . Not on file  Tobacco Use  . Smoking status: Former Smoker    Last attempt to quit: 09/16/2011    Years since quitting: 5.5  Substance and Sexual Activity  . Alcohol use: No  . Drug use: No  . Sexual activity: Yes  Other Topics Concern  . Not on file  Social History Narrative  . Not on file    FAMILY HISTORY: Family History  Problem Relation Age of Onset  . Cancer Mother        Ovarian cancer (2003), breast cancer(2017) & squamous cell   . Leukemia Mother   . Cancer Sister        breast cancer  . Melanoma Father   . Lung cancer Paternal Grandmother     ALLERGIES:  has No Known Allergies.  MEDICATIONS:  Current Outpatient Medications  Medication Sig Dispense Refill  . aspirin-acetaminophen-caffeine (EXCEDRIN MIGRAINE) 250-250-65 MG per tablet Take 1 tablet by mouth as needed for headache.    .Marland Kitchen  lidocaine-prilocaine (EMLA) cream Apply 1 application as needed topically. Apply generously over the Mediport 45 minutes prior to chemotherapy. 30 g 0  . loratadine (CLARITIN) 10 MG tablet Take 10 mg daily by mouth.    . ondansetron (ZOFRAN) 8 MG tablet One pill every 8 hours as needed for nausea/vomitting. (Patient not taking: Reported on 03/17/2017) 40 tablet 1  . prochlorperazine (COMPAZINE) 10 MG tablet Take 1 tablet (10 mg total)  every 6 (six) hours as needed by mouth for nausea or vomiting. (Patient not taking: Reported on 03/17/2017) 40 tablet 1  . Pseudoephedrine-Naproxen Na (ALEVE-D SINUS & HEADACHE PO) Take 1 each by mouth as needed.    . sertraline (ZOLOFT) 100 MG tablet Take 100 mg by mouth.     No current facility-administered medications for this visit.       Marland Kitchen  PHYSICAL EXAMINATION: ECOG PERFORMANCE STATUS: 0 - Asymptomatic  Vitals:   04/02/17 0852  BP: 110/75  Pulse: 82  Resp: 16  Temp: 98.1 F (36.7 C)   Filed Weights   04/02/17 0852  Weight: 142 lb 12.8 oz (64.8 kg)    GENERAL: Well-nourished well-developed; Alert, no distress and comfortable.   Accompanied by her husband. EYES: no pallor or icterus OROPHARYNX: no thrush or ulceration; good dentition  NECK: supple, no masses felt LYMPH:  no palpable lymphadenopathy in the cervical, axillary or inguinal regions LUNGS: clear to auscultation and  No wheeze or crackles HEART/CVS: regular rate & rhythm and no murmurs; No lower extremity edema ABDOMEN: abdomen soft, non-tender and normal bowel sounds Musculoskeletal:no cyanosis of digits and no clubbing  PSYCH: alert & oriented x 3 with fluent speech NEURO: no focal motor/sensory deficits SKIN:  no rashes or significant lesions;  LABORATORY DATA:  I have reviewed the data as listed Lab Results  Component Value Date   WBC 13.3 (H) 04/02/2017   HGB 11.5 (L) 04/02/2017   HCT 34.3 (L) 04/02/2017   MCV 92.9 04/02/2017   PLT 247 04/02/2017   Recent Labs    03/03/17 1548 03/19/17 0838 03/26/17 0926 04/02/17 0832  NA 134* 135 134* 134*  K 4.1 3.7 4.8 4.0  CL 100* 106 102 101  CO2 27 21* 25 25  GLUCOSE 88 132* 113* 120*  BUN _0 CREATININE 0.65 0.67 0.55 0.67  CALCIUM 9.3 9.0 9.1 9.0  GFRNONAA >60 >60 >60 >60  GFRAA >60 >60 >60 >60  PROT 7.9  --   --  7.6  ALBUMIN 4.5  --   --  3.9  AST 17  --   --  30  ALT 12*  --   --  13*  ALKPHOS 69  --   --  97  BILITOT 0.5  --    --  0.4    RADIOGRAPHIC STUDIES: I have personally reviewed the radiological images as listed and agreed with the findings in the report. Nm Cardiac Muga Rest  Result Date: 03/07/2017 CLINICAL DATA:  RIGHT breast cancer, pre cardiotoxic chemotherapy EXAM: NUCLEAR MEDICINE CARDIAC BLOOD POOL IMAGING (MUGA) TECHNIQUE: Cardiac multi-gated acquisition was performed at rest following intravenous injection of Tc-3mlabeled red blood cells. RADIOPHARMACEUTICALS:  23.81 mCi Tc-960mertechnetate UltraTag in-vitro labeled autologous red blood cells IV COMPARISON:  None FINDINGS: Calculated LEFT ventricular ejection fraction is 69% which is within the normal range. Patient was rhythmic during image acquisition. No focal wall motion abnormalities. IMPRESSION: Calculated LEFT ventricular ejection fraction of 69%. Normal LEFT ventricular wall motion. Electronically Signed  By: Lavonia Dana M.D.   On: 03/07/2017 16:13   Ir Fluoro Guide Port Insertion Left  Result Date: 03/17/2017 CLINICAL DATA:  Invasive ductal carcinoma of the right breast and need for porta cath to begin chemotherapy. EXAM: IMPLANTED PORT A CATH PLACEMENT WITH ULTRASOUND AND FLUOROSCOPIC GUIDANCE ANESTHESIA/SEDATION: 4.0 mg IV Versed; 100 mcg IV Fentanyl Total Moderate Sedation Time:  32 minutes The patient's level of consciousness and physiologic status were continuously monitored during the procedure by Radiology nursing. Additional Medications: 2 g IV Ancef. FLUOROSCOPY TIME:  24 seconds.  2.0 mGy. PROCEDURE: The procedure, risks, benefits, and alternatives were explained to the patient. Questions regarding the procedure were encouraged and answered. The patient understands and consents to the procedure. A time-out was performed prior to initiating the procedure. Ultrasound was utilized to confirm patency of the left internal jugular vein. The left neck and chest were prepped with chlorhexidine in a sterile fashion, and a sterile drape was  applied covering the operative field. Maximum barrier sterile technique with sterile gowns and gloves were used for the procedure. Local anesthesia was provided with 1% lidocaine. After creating a small venotomy incision, a 21 gauge needle was advanced into the left internal jugular vein under direct, real-time ultrasound guidance. Ultrasound image documentation was performed. After securing guidewire access, an 8 Fr dilator was placed. A J-wire was kinked to measure appropriate catheter length. A subcutaneous port pocket was then created along the upper chest wall utilizing sharp and blunt dissection. Portable cautery was utilized. The pocket was irrigated with sterile saline. A single lumen power injectable port was chosen for placement. The 8 Fr catheter was tunneled from the port pocket site to the venotomy incision. The port was placed in the pocket. External catheter was trimmed to appropriate length based on guidewire measurement. At the venotomy, an 8 Fr peel-away sheath was placed over a guidewire. The catheter was then placed through the sheath and the sheath removed. Final catheter positioning was confirmed and documented with a fluoroscopic spot image. The port was accessed with a needle and aspirated and flushed with heparinized saline. The access needle was removed. The venotomy and port pocket incisions were closed with subcutaneous 3-0 Monocryl and subcuticular 4-0 Vicryl. Dermabond was applied to both incisions. COMPLICATIONS: COMPLICATIONS None FINDINGS: After catheter placement, the tip lies at the cavo-atrial junction. The catheter aspirates normally and is ready for immediate use. IMPRESSION: Placement of single lumen port a cath via left internal jugular vein. The catheter tip lies at the cavo-atrial junction. A power injectable port a cath was placed and is ready for immediate use. Electronically Signed   By: Aletta Edouard M.D.   On: 03/17/2017 13:17    ASSESSMENT & PLAN:   Malignant  neoplasm of upper-inner quadrant of right breast in female, estrogen receptor negative ([place]) # Stage I ER/PR/HER-2/neu negative right breast cancer-currently on neoadjuvant chemotherapy- ddAC- Taxol. S/p ddAC cycle#1.   #Proceed with cycle #2 of dose dense Adriamycin and Cytoxan today. Labs today reviewed;  acceptable for treatment today.   # Port pain- ? Improved.    # headaches- post chemo- currently resolved.   #Patient will need also need bilateral salpingo-oophorectomy down the line.  # labs- 1 week; 2 weeks/MD/ infusion;onpro.  All questions were answered. The patient knows to call the clinic with any problems, questions or concerns.    Cammie Sickle, MD 04/02/2017 4:17 PM

## 2017-04-02 NOTE — Assessment & Plan Note (Addendum)
#  Stage I ER/PR/HER-2/neu negative right breast cancer-currently on neoadjuvant chemotherapy- ddAC- Taxol. S/p ddAC cycle#1.   #Proceed with cycle #2 of dose dense Adriamycin and Cytoxan today. Labs today reviewed;  acceptable for treatment today.   # Port pain- ?  Incisional versus others.  Clinically no evidence of DVT.  Improved.    # headaches- post chemo- currently resolved.   #Patient will need also need bilateral salpingo-oophorectomy down the line.  # labs- 1 week; 2 weeks/MD/ infusion;onpro.

## 2017-04-09 ENCOUNTER — Inpatient Hospital Stay

## 2017-04-09 DIAGNOSIS — Z171 Estrogen receptor negative status [ER-]: Principal | ICD-10-CM

## 2017-04-09 DIAGNOSIS — C50211 Malignant neoplasm of upper-inner quadrant of right female breast: Secondary | ICD-10-CM

## 2017-04-09 DIAGNOSIS — Z5111 Encounter for antineoplastic chemotherapy: Secondary | ICD-10-CM | POA: Diagnosis not present

## 2017-04-09 LAB — COMPREHENSIVE METABOLIC PANEL
ALBUMIN: 4.1 g/dL (ref 3.5–5.0)
ALK PHOS: 113 U/L (ref 38–126)
ALT: 10 U/L — AB (ref 14–54)
ANION GAP: 7 (ref 5–15)
AST: 15 U/L (ref 15–41)
BILIRUBIN TOTAL: 0.6 mg/dL (ref 0.3–1.2)
BUN: 16 mg/dL (ref 6–20)
CALCIUM: 9 mg/dL (ref 8.9–10.3)
CO2: 26 mmol/L (ref 22–32)
Chloride: 101 mmol/L (ref 101–111)
Creatinine, Ser: 0.59 mg/dL (ref 0.44–1.00)
GFR calc Af Amer: >60 mL/min (ref >60)
GLUCOSE: 102 mg/dL — AB (ref 65–99)
POTASSIUM: 4.3 mmol/L (ref 3.5–5.1)
Sodium: 134 mmol/L — ABNORMAL LOW (ref 135–145)
TOTAL PROTEIN: 7.4 g/dL (ref 6.5–8.1)

## 2017-04-09 LAB — CBC WITH DIFFERENTIAL/PLATELET
BASOS ABS: 0 10*3/uL (ref 0–0.1)
Basophils Relative: 0 %
EOS ABS: 0 10*3/uL (ref 0–0.7)
Eosinophils Relative: 1 %
HEMATOCRIT: 31.5 % — AB (ref 35.0–47.0)
Hemoglobin: 10.5 g/dL — ABNORMAL LOW (ref 12.0–16.0)
LYMPHS ABS: 0.6 10*3/uL — AB (ref 1.0–3.6)
Lymphocytes Relative: 34 %
MCH: 31.3 pg (ref 26.0–34.0)
MCHC: 33.4 g/dL (ref 32.0–36.0)
MCV: 93.6 fL (ref 80.0–100.0)
MONOS PCT: 9 %
Monocytes Absolute: 0.2 10*3/uL (ref 0.2–0.9)
NEUTROS ABS: 1.1 10*3/uL — AB (ref 1.4–6.5)
Neutrophils Relative %: 56 %
PLATELETS: 212 10*3/uL (ref 150–440)
RBC: 3.36 MIL/uL — AB (ref 3.80–5.20)
RDW: 13.3 % (ref 11.5–14.5)
WBC: 1.9 10*3/uL — AB (ref 3.6–11.0)

## 2017-04-14 ENCOUNTER — Telehealth: Payer: Self-pay

## 2017-04-14 NOTE — Telephone Encounter (Signed)
Patient called stating she developed signs and symptoms of a cold over the weekend (cough, congestion, runny nose); afebrile. Notified Dr. Tish Men.  He wants the patient to keep her scheduled appt for Wednesday and will determine that day if she will receive treatment.  Patient notified of the plan.

## 2017-04-15 ENCOUNTER — Encounter: Payer: Self-pay | Admitting: *Deleted

## 2017-04-15 ENCOUNTER — Telehealth: Payer: Self-pay | Admitting: *Deleted

## 2017-04-15 NOTE — Telephone Encounter (Signed)
Prescription written and will be faxed to [facility] at [phone].

## 2017-04-15 NOTE — Telephone Encounter (Signed)
Called for an order for Total Cranial Prosthesis  nad needs Diagnosis code on it too faxed to [phone]

## 2017-04-16 ENCOUNTER — Inpatient Hospital Stay

## 2017-04-16 ENCOUNTER — Encounter: Payer: Self-pay | Admitting: *Deleted

## 2017-04-16 ENCOUNTER — Encounter: Payer: Self-pay | Admitting: Internal Medicine

## 2017-04-16 ENCOUNTER — Inpatient Hospital Stay (HOSPITAL_BASED_OUTPATIENT_CLINIC_OR_DEPARTMENT_OTHER): Admitting: Internal Medicine

## 2017-04-16 VITALS — BP 106/65 | HR 87 | Temp 97.4°F | Resp 16 | Wt 144.0 lb

## 2017-04-16 DIAGNOSIS — Z171 Estrogen receptor negative status [ER-]: Principal | ICD-10-CM

## 2017-04-16 DIAGNOSIS — Z87891 Personal history of nicotine dependence: Secondary | ICD-10-CM

## 2017-04-16 DIAGNOSIS — Z79899 Other long term (current) drug therapy: Secondary | ICD-10-CM | POA: Diagnosis not present

## 2017-04-16 DIAGNOSIS — Z5111 Encounter for antineoplastic chemotherapy: Secondary | ICD-10-CM | POA: Diagnosis not present

## 2017-04-16 DIAGNOSIS — C50211 Malignant neoplasm of upper-inner quadrant of right female breast: Secondary | ICD-10-CM

## 2017-04-16 DIAGNOSIS — Z006 Encounter for examination for normal comparison and control in clinical research program: Secondary | ICD-10-CM | POA: Diagnosis not present

## 2017-04-16 LAB — COMPREHENSIVE METABOLIC PANEL
ALBUMIN: 4 g/dL (ref 3.5–5.0)
ALT: 11 U/L — AB (ref 14–54)
AST: 20 U/L (ref 15–41)
Alkaline Phosphatase: 104 U/L (ref 38–126)
Anion gap: 9 (ref 5–15)
BUN: 14 mg/dL (ref 6–20)
CHLORIDE: 100 mmol/L — AB (ref 101–111)
CO2: 24 mmol/L (ref 22–32)
CREATININE: 0.56 mg/dL (ref 0.44–1.00)
Calcium: 8.9 mg/dL (ref 8.9–10.3)
GFR calc Af Amer: >60 mL/min (ref >60)
GLUCOSE: 124 mg/dL — AB (ref 65–99)
POTASSIUM: 4.1 mmol/L (ref 3.5–5.1)
SODIUM: 133 mmol/L — AB (ref 135–145)
Total Bilirubin: 0.3 mg/dL (ref 0.3–1.2)
Total Protein: 7.4 g/dL (ref 6.5–8.1)

## 2017-04-16 LAB — CBC WITH DIFFERENTIAL/PLATELET
Basophils Absolute: 0.1 10*3/uL (ref 0–0.1)
Basophils Relative: 0 %
Eosinophils Absolute: 0.1 10*3/uL (ref 0–0.7)
Eosinophils Relative: 0 %
HEMATOCRIT: 31.6 % — AB (ref 35.0–47.0)
HEMOGLOBIN: 10.5 g/dL — AB (ref 12.0–16.0)
LYMPHS ABS: 1.9 10*3/uL (ref 1.0–3.6)
Lymphocytes Relative: 11 %
MCH: 31.1 pg (ref 26.0–34.0)
MCHC: 33.3 g/dL (ref 32.0–36.0)
MCV: 93.4 fL (ref 80.0–100.0)
MONOS PCT: 7 %
Monocytes Absolute: 1.2 10*3/uL — ABNORMAL HIGH (ref 0.2–0.9)
NEUTROS ABS: 15 10*3/uL — AB (ref 1.4–6.5)
NEUTROS PCT: 82 %
Platelets: 256 10*3/uL (ref 150–440)
RBC: 3.38 MIL/uL — ABNORMAL LOW (ref 3.80–5.20)
RDW: 13.7 % (ref 11.5–14.5)
WBC: 18.4 10*3/uL — ABNORMAL HIGH (ref 3.6–11.0)

## 2017-04-16 MED ORDER — HEPARIN SOD (PORK) LOCK FLUSH 100 UNIT/ML IV SOLN
500.0000 [IU] | Freq: Once | INTRAVENOUS | Status: AC
  Administered 2017-04-16: 500 [IU] via INTRAVENOUS
  Filled 2017-04-16: qty 5

## 2017-04-16 MED ORDER — SODIUM CHLORIDE 0.9 % IV SOLN
Freq: Once | INTRAVENOUS | Status: AC
  Administered 2017-04-16: 11:00:00 via INTRAVENOUS
  Filled 2017-04-16: qty 5

## 2017-04-16 MED ORDER — DOXORUBICIN HCL CHEMO IV INJECTION 2 MG/ML
60.0000 mg/m2 | Freq: Once | INTRAVENOUS | Status: AC
  Administered 2017-04-16: 106 mg via INTRAVENOUS
  Filled 2017-04-16: qty 53

## 2017-04-16 MED ORDER — SODIUM CHLORIDE 0.9 % IV SOLN
1000.0000 mg | Freq: Once | INTRAVENOUS | Status: AC
  Administered 2017-04-16: 1000 mg via INTRAVENOUS
  Filled 2017-04-16: qty 50

## 2017-04-16 MED ORDER — SODIUM CHLORIDE 0.9 % IV SOLN
Freq: Once | INTRAVENOUS | Status: AC
  Administered 2017-04-16: 11:00:00 via INTRAVENOUS
  Filled 2017-04-16: qty 1000

## 2017-04-16 MED ORDER — PEGFILGRASTIM 6 MG/0.6ML ~~LOC~~ PSKT
6.0000 mg | PREFILLED_SYRINGE | Freq: Once | SUBCUTANEOUS | Status: AC
  Administered 2017-04-16: 6 mg via SUBCUTANEOUS
  Filled 2017-04-16: qty 0.6

## 2017-04-16 MED ORDER — PALONOSETRON HCL INJECTION 0.25 MG/5ML
0.2500 mg | Freq: Once | INTRAVENOUS | Status: AC
  Administered 2017-04-16: 0.25 mg via INTRAVENOUS
  Filled 2017-04-16: qty 5

## 2017-04-16 NOTE — Progress Notes (Signed)
Holly Parker returns to clinic this morning for C3, D1  chemotherapy with AC. Her sister is with her this morning. States her last chemotherapy was a little better, but that she had one day that she was very nauseated and had a headache, then gradually improved after that. She has had [facility] study labs collected earlier this morning along with her local treatment labs. Informed that she will not have any activity for the study now until the 3 month time-point and at that time we will complete the physical and neurocognitive assessments as well as the questionnaires. Dr. Rogue Bussing in to see patient. [name], BSN, MHA, OCN 03/19/2017 1:16 PM

## 2017-04-16 NOTE — Assessment & Plan Note (Addendum)
#  Stage I ER/PR/HER-2/neu negative right breast cancer-currently on neoadjuvant chemotherapy- ddAC- Taxol. S/p dd AC cycle#2-tolerating chemotherapy well for possible URI [see discussion below]  #Proceed with cycle #3 of dose dense Adriamycin and Cytoxan today. Labs today reviewed;  acceptable for treatment today-except for elevated white count secondary to growth factor.  I again reviewed the chemotherapy schedule with the patient and the sister in detail.  We will plan to breast ultrasound after cycle #4.  # ? URI-improving.  Continue symptomatic treatment.   # headaches- post chemo- currently resolved.   #Patient will need also need bilateral salpingo-oophorectomy down the line.  # labs- 1 week; 2 weeks/MD/ infusion;onpro.

## 2017-04-16 NOTE — Progress Notes (Signed)
[place] CONSULT NOTE  Patient Care Team: Patient, No Pcp Per as PCP - General (General Practice) Magrinat, Virgie Dad, MD as Consulting Physician (Oncology) Armandina Stammer, MD (Obstetrics and Gynecology) Dillingham, Loel Lofty, DO as Attending Physician (Plastic Surgery) Coralie Keens, MD as Consulting Physician (General Surgery) Cammie Sickle, MD as Consulting Physician (Internal Medicine)  CHIEF COMPLAINTS/PURPOSE OF CONSULTATION:  Breast cancer  #  Oncology History   # OCT 2018- RIGHT BREAST UIQ -[place]; TRIPLE NEGATIVE; ki-67-high [40m-1'O clock-Bx- proven; [place]]; another- 12'O clock-452m Not biopsied. [s/p Dr.Magrinaut; [place]]; cT1c(m) cN0;  G-3; ki-67- 80%  # 11/218- ddAC-Taxol+carbo  # BRCA-1 Gene mutated [mom-ovarian cancer-Stage IIIB/Brca; older sister- breast ca/brca-mutated]  # MUGA scan [nov 2018]- 67%; UPBEAT clinical trial     Malignant neoplasm of upper-inner quadrant of right breast in female, estrogen receptor negative ([facility]   HISTORY OF PRESENTING ILLNESS:  [name]89.o.  female with a history of BRCA1 mutation; triple negative stage I breast cancer-currently on neoadjuvant chemotherapy status post dose dense-Adriamycin and Cytoxan status post cycle #2 is here for follow-up.  Patient noted to have mild headache post chemotherapy.  Current resolved.   The last few days noted to have hoarseness of voice nasal congestion; no fever chills.  Mild cough.  Overall improving-on symptomatic antihistamines.   Denies any sores in the mouth.  No skin rash.  ROS: A complete 10 point review of system is done which is negative except mentioned above in history of present illness  MEDICAL HISTORY:  Past Medical History:  Diagnosis Date  . Asthma    Excercise induced  . Fibromyalgia   . Malignant neoplasm of upper-inner quadrant of right breast in female, estrogen receptor negative ([place]    SURGICAL HISTORY: Past Surgical  History:  Procedure Laterality Date  . IR FLUORO GUIDE PORT INSERTION LEFT  03/17/2017  . KNEE SURGERY Left    Torn meniscus   . 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 Marital status: Married    Spouse name: Not on file  . Number of children: Not on file  . Years of education: Not on file  . Highest education level: Not on file  Social Needs  . Financial resource strain: Not on file  . Food insecurity - worry: Not on file  . Food insecurity - inability: Not on file  . Transportation needs - medical: Not on file  . Transportation needs - non-medical: Not on file  Occupational History  . Not on file  Tobacco Use  . Smoking status: Former Smoker    Last attempt to quit: 09/16/2011    Years since quitting: 5.5  Substance and Sexual Activity  . Alcohol use: No  . Drug use: No  . Sexual activity: Yes  Other Topics Concern  . Not on file  Social History Narrative  . Not on file    FAMILY HISTORY: Family History  Problem Relation Age of Onset  . Cancer Mother        Ovarian cancer (2003), breast cancer(2017) & squamous cell   . Leukemia Mother   . Cancer Sister        breast cancer  . Melanoma Father   . Lung cancer Paternal Grandmother     ALLERGIES:  has No Known Allergies.  MEDICATIONS:  Current Outpatient Medications  Medication Sig Dispense Refill  . aspirin-acetaminophen-caffeine (  EXCEDRIN MIGRAINE) 250-250-65 MG per tablet Take 1 tablet by mouth as needed for headache.    . lidocaine-prilocaine (EMLA) cream Apply 1 application as needed topically. Apply generously over the Mediport 45 minutes prior to chemotherapy. 30 g 0  . loratadine (CLARITIN) 10 MG tablet Take 10 mg daily by mouth.    . ondansetron (ZOFRAN) 8 MG tablet One pill every 8 hours as needed for nausea/vomitting. 40 tablet 1  . prochlorperazine (COMPAZINE) 10 MG tablet Take 1 tablet (10 mg  total) every 6 (six) hours as needed by mouth for nausea or vomiting. 40 tablet 1  . sertraline (ZOLOFT) 100 MG tablet Take 100 mg by mouth.    . Pseudoephedrine-Naproxen Na (ALEVE-D SINUS & HEADACHE PO) Take 1 each by mouth as needed.     No current facility-administered medications for this visit.       Marland Kitchen  PHYSICAL EXAMINATION: ECOG PERFORMANCE STATUS: 0 - Asymptomatic  Vitals:   04/16/17 0946  BP: 106/65  Pulse: 87  Resp: 16  Temp: (!) 97.4 F (36.3 C)   Filed Weights   04/16/17 0946  Weight: 144 lb (65.3 kg)    GENERAL: Well-nourished well-developed; Alert, no distress and comfortable.   Accompanied by her sister. EYES: no pallor or icterus OROPHARYNX: no thrush or ulceration; good dentition  NECK: supple, no masses felt LYMPH:  no palpable lymphadenopathy in the cervical, axillary or inguinal regions LUNGS: clear to auscultation and  No wheeze or crackles HEART/CVS: regular rate & rhythm and no murmurs; No lower extremity edema ABDOMEN: abdomen soft, non-tender and normal bowel sounds Musculoskeletal:no cyanosis of digits and no clubbing  PSYCH: alert & oriented x 3 with fluent speech NEURO: no focal motor/sensory deficits SKIN:  no rashes or significant lesions;  LABORATORY DATA:  I have reviewed the data as listed Lab Results  Component Value Date   WBC 18.4 (H) 04/16/2017   HGB 10.5 (L) 04/16/2017   HCT 31.6 (L) 04/16/2017   MCV 93.4 04/16/2017   PLT 256 04/16/2017   Recent Labs    04/02/17 0832 04/09/17 1107 04/16/17 0930  NA 134* 134* 133*  K 4.0 4.3 4.1  CL 101 101 100*  CO2 '25 26 24  ' GLUCOSE 120* 102* 124*  BUN '16 16 14  ' CREATININE 0.67 0.59 0.56  CALCIUM 9.0 9.0 8.9  GFRNONAA >60 >60 >60  GFRAA >60 >60 >60  PROT 7.6 7.4 7.4  ALBUMIN 3.9 4.1 4.0  AST '30 15 20  ' ALT 13* 10* 11*  ALKPHOS 97 113 104  BILITOT 0.4 0.6 0.3    RADIOGRAPHIC STUDIES: I have personally reviewed the radiological images as listed and agreed with the findings in  the report. No results found.  ASSESSMENT & PLAN:   Malignant neoplasm of upper-inner quadrant of right breast in female, estrogen receptor negative ([place]) # Stage I ER/PR/HER-2/neu negative right breast cancer-currently on neoadjuvant chemotherapy- ddAC- Taxol. S/p dd AC cycle#2-tolerating chemotherapy well for possible URI [see discussion below]  #Proceed with cycle #3 of dose dense Adriamycin and Cytoxan today. Labs today reviewed;  acceptable for treatment today-except for elevated white count secondary to growth factor.  I again reviewed the chemotherapy schedule with the patient and the sister in detail.  # ? URI-improving.  Continue symptomatic treatment.   # headaches- post chemo- currently resolved.   #Patient will need also need bilateral salpingo-oophorectomy down the line.  # labs- 1 week; 2 weeks/MD/ infusion;onpro.  All questions were answered. The patient knows to  call the clinic with any problems, questions or concerns.    Cammie Sickle, MD 04/16/2017 1:11 PM

## 2017-04-23 ENCOUNTER — Inpatient Hospital Stay

## 2017-04-23 ENCOUNTER — Inpatient Hospital Stay (HOSPITAL_BASED_OUTPATIENT_CLINIC_OR_DEPARTMENT_OTHER): Admitting: Nurse Practitioner

## 2017-04-23 ENCOUNTER — Ambulatory Visit
Admission: RE | Admit: 2017-04-23 | Discharge: 2017-04-23 | Disposition: A | Discharge status: Home / Self Care | Source: Ambulatory Visit | Attending: Nurse Practitioner | Admitting: Nurse Practitioner

## 2017-04-23 ENCOUNTER — Telehealth: Payer: Self-pay | Admitting: *Deleted

## 2017-04-23 ENCOUNTER — Other Ambulatory Visit: Payer: Self-pay

## 2017-04-23 VITALS — BP 106/66 | HR 87

## 2017-04-23 VITALS — BP 93/63 | HR 85 | Temp 98.4°F | Resp 16 | Wt 142.6 lb

## 2017-04-23 DIAGNOSIS — C50211 Malignant neoplasm of upper-inner quadrant of right female breast: Secondary | ICD-10-CM

## 2017-04-23 DIAGNOSIS — J019 Acute sinusitis, unspecified: Secondary | ICD-10-CM

## 2017-04-23 DIAGNOSIS — Z5189 Encounter for other specified aftercare: Secondary | ICD-10-CM

## 2017-04-23 DIAGNOSIS — R05 Cough: Secondary | ICD-10-CM

## 2017-04-23 DIAGNOSIS — Z5111 Encounter for antineoplastic chemotherapy: Secondary | ICD-10-CM | POA: Diagnosis not present

## 2017-04-23 DIAGNOSIS — B9689 Other specified bacterial agents as the cause of diseases classified elsewhere: Secondary | ICD-10-CM | POA: Diagnosis not present

## 2017-04-23 DIAGNOSIS — R059 Cough, unspecified: Secondary | ICD-10-CM

## 2017-04-23 DIAGNOSIS — E860 Dehydration: Secondary | ICD-10-CM

## 2017-04-23 DIAGNOSIS — Z95828 Presence of other vascular implants and grafts: Secondary | ICD-10-CM

## 2017-04-23 DIAGNOSIS — Z171 Estrogen receptor negative status [ER-]: Secondary | ICD-10-CM

## 2017-04-23 LAB — CBC WITH DIFFERENTIAL/PLATELET
Basophils Absolute: 0 10*3/uL (ref 0–0.1)
Basophils Relative: 0 %
EOS PCT: 1 %
Eosinophils Absolute: 0 10*3/uL (ref 0–0.7)
HEMATOCRIT: 27.6 % — AB (ref 35.0–47.0)
Hemoglobin: 9.4 g/dL — ABNORMAL LOW (ref 12.0–16.0)
LYMPHS ABS: 0.6 10*3/uL — AB (ref 1.0–3.6)
LYMPHS PCT: 30 %
MCH: 31.7 pg (ref 26.0–34.0)
MCHC: 34.1 g/dL (ref 32.0–36.0)
MCV: 92.9 fL (ref 80.0–100.0)
Monocytes Absolute: 0.2 10*3/uL (ref 0.2–0.9)
Monocytes Relative: 9 %
Neutro Abs: 1.2 10*3/uL — ABNORMAL LOW (ref 1.4–6.5)
Neutrophils Relative %: 60 %
Platelets: 164 10*3/uL (ref 150–440)
RBC: 2.97 MIL/uL — AB (ref 3.80–5.20)
RDW: 14 % (ref 11.5–14.5)
WBC: 2 10*3/uL — AB (ref 3.6–11.0)

## 2017-04-23 LAB — COMPREHENSIVE METABOLIC PANEL
ALBUMIN: 3.8 g/dL (ref 3.5–5.0)
ALT: 13 U/L — ABNORMAL LOW (ref 14–54)
AST: 16 U/L (ref 15–41)
Alkaline Phosphatase: 117 U/L (ref 38–126)
Anion gap: 6 (ref 5–15)
BUN: 14 mg/dL (ref 6–20)
CHLORIDE: 103 mmol/L (ref 101–111)
CO2: 28 mmol/L (ref 22–32)
Calcium: 8.8 mg/dL — ABNORMAL LOW (ref 8.9–10.3)
Creatinine, Ser: 0.55 mg/dL (ref 0.44–1.00)
GFR calc Af Amer: >60 mL/min (ref >60)
Glucose, Bld: 107 mg/dL — ABNORMAL HIGH (ref 65–99)
POTASSIUM: 4.3 mmol/L (ref 3.5–5.1)
SODIUM: 137 mmol/L (ref 135–145)
Total Bilirubin: 0.5 mg/dL (ref 0.3–1.2)
Total Protein: 7.1 g/dL (ref 6.5–8.1)

## 2017-04-23 MED ORDER — HEPARIN SOD (PORK) LOCK FLUSH 100 UNIT/ML IV SOLN
500.0000 [IU] | Freq: Once | INTRAVENOUS | Status: AC
  Administered 2017-04-23: 500 [IU]

## 2017-04-23 MED ORDER — SODIUM CHLORIDE 0.9 % IV SOLN
INTRAVENOUS | Status: DC
  Administered 2017-04-23: 999 mL/h via INTRAVENOUS
  Filled 2017-04-23 (×2): qty 1000

## 2017-04-23 MED ORDER — AMOXICILLIN 875 MG PO TABS: 875.0000 mg | ORAL_TABLET | Freq: Two times a day (BID) | ORAL | 0 refills | Status: DC

## 2017-04-23 MED ORDER — SODIUM CHLORIDE 0.9% FLUSH
10.0000 mL | Freq: Once | INTRAVENOUS | Status: AC
  Administered 2017-04-23: 10 mL via INTRAVENOUS
  Filled 2017-04-23: qty 10

## 2017-04-23 MED ORDER — BENZONATATE 100 MG PO CAPS: 200.0000 mg | ORAL_CAPSULE | Freq: Three times a day (TID) | ORAL | 0 refills | Status: DC | PRN

## 2017-04-23 NOTE — Progress Notes (Signed)
Symptom Management Consult note [facility]  [phone] [phone]  Patient Care Team: Patient, No Pcp Per as PCP - General (General Practice) Magrinat, Virgie Dad, MD as Consulting Physician (Oncology) Armandina Stammer, MD (Obstetrics and Gynecology) Dillingham, Loel Lofty, DO as Attending Physician (Plastic Surgery) Coralie Keens, MD as Consulting Physician (General Surgery) Cammie Sickle, MD as Consulting Physician (Internal Medicine)   Name of the patient: Holly Parker  [id number]  Apr 25, 1981   Date of visit: 04/25/17  Diagnosis- Stage I ER/PR/HER-2/neu negative right breast cancer  Chief complaint/ Reason for visit- cough, nasal drainage  Heme/Onc history: Patient most recently seen by primary oncologist, Dr. Rogue Bussing, 04/16/17. She has a history of BRCA1 mutation, triple negative stage I breast cancer currently on neoadjuvant chemotherapy s/p dose dense Adriamycin and Cytoxan with Neulasta support, s/p cycle 3. MUGA scan 02/2017 67%, UPBEAT clinical trial.   Interval history- Patient presents to Symptom Management clinic today with complaints of symptoms of a URI. Symptoms include congestion, cough, plugged sensation in both ears and sore throat. Onset of symptoms was 2 weeks ago, gradually worsening since that time. She also c/o achiness, congestion, cough described as productive and worsening over time, headache described as pounding/pressure, nasal congestion, no  fever, post nasal drip and productive cough with  clear colored sputum for the past 1 week .  She is drinking moderate amounts of fluids. Evaluation to date: CBC, CMet, Chest X-ray. Treatment to date: dayquil, nyquil, cough drops.    ECOG FS:0 - Asymptomatic  Review of systems- Review of Systems  Constitutional: Positive for malaise/fatigue. Negative for chills, fever and weight loss.  HENT: Positive for congestion, ear pain, sinus pain and sore throat.  Negative for ear discharge, hearing loss, nosebleeds and tinnitus.        Voice changes  Eyes: Negative for pain and redness.  Respiratory: Positive for cough and sputum production. Negative for hemoptysis, shortness of breath and wheezing.   Cardiovascular: Negative for chest pain, palpitations and leg swelling.  Gastrointestinal: Negative for abdominal pain, diarrhea, nausea and vomiting.  Musculoskeletal: Negative for myalgias.  Skin: Negative for itching and rash.  Neurological: Positive for speech change and headaches. Negative for dizziness, tingling, tremors, sensory change and loss of consciousness.  Endo/Heme/Allergies: Positive for environmental allergies.     Current treatment- s/p cycle 3 Adriamycin & Cytoxan w/ Neulasta  No Known Allergies   Past Medical History:  Diagnosis Date  . Asthma    Excercise induced  . Fibromyalgia   . Malignant neoplasm of upper-inner quadrant of right breast in female, estrogen receptor negative [facility])      Past Surgical History:  Procedure Laterality Date  . IR FLUORO GUIDE PORT INSERTION LEFT  03/17/2017  . KNEE SURGERY Left    Torn meniscus   . RHINOPLASTY      Social History   Socioeconomic History  . Marital status: Married    Spouse name: Not on file  . Number of children: Not on file  . Years of education: Not on file  . Highest education level: Not on file  Social Needs  . Financial resource strain: Not on file  . Food insecurity - worry: Not on file  . Food insecurity - inability: Not on file  . Transportation needs - medical: Not on file  . Transportation needs - non-medical: Not on file  Occupational History  . Not on file  Tobacco Use  . Smoking status: Former Smoker  Last attempt to quit: 09/16/2011    Years since quitting: 5.6  Substance and Sexual Activity  . Alcohol use: No  . Drug use: No  . Sexual activity: Yes  Other Topics Concern  . Not on file  Social History Narrative  . Not on file     Family History  Problem Relation Age of Onset  . Cancer Mother        Ovarian cancer (2003), breast cancer(2017) & squamous cell   . Leukemia Mother   . Cancer Sister        breast cancer  . Melanoma Father   . Lung cancer Paternal Grandmother      Current Outpatient Medications:  .  aspirin-acetaminophen-caffeine (EXCEDRIN MIGRAINE) 250-250-65 MG per tablet, Take 1 tablet by mouth as needed for headache., Disp: , Rfl:  .  lidocaine-prilocaine (EMLA) cream, Apply 1 application as needed topically. Apply generously over the Mediport 45 minutes prior to chemotherapy., Disp: 30 g, Rfl: 0 .  loratadine (CLARITIN) 10 MG tablet, Take 10 mg daily by mouth., Disp: , Rfl:  .  ondansetron (ZOFRAN) 8 MG tablet, One pill every 8 hours as needed for nausea/vomitting., Disp: 40 tablet, Rfl: 1 .  prochlorperazine (COMPAZINE) 10 MG tablet, Take 1 tablet (10 mg total) every 6 (six) hours as needed by mouth for nausea or vomiting., Disp: 40 tablet, Rfl: 1 .  Pseudoephedrine-Naproxen Na (ALEVE-D SINUS & HEADACHE PO), Take 1 each by mouth as needed., Disp: , Rfl:  .  sertraline (ZOLOFT) 100 MG tablet, Take 100 mg by mouth., Disp: , Rfl:  .  amoxicillin (AMOXIL) 875 MG tablet, Take 1 tablet (875 mg total) by mouth 2 (two) times daily., Disp: 20 tablet, Rfl: 0 .  benzonatate (TESSALON PERLES) 100 MG capsule, Take 2 capsules (200 mg total) by mouth 3 (three) times daily as needed for cough., Disp: 30 capsule, Rfl: 0  Physical exam:  Vitals:   04/23/17 1431  BP: 93/63  Pulse: 85  Resp: 16  Temp: 98.4 F (36.9 C)  TempSrc: Tympanic  SpO2: 98%  Weight: 142 lb 9.6 oz (64.7 kg)   Physical Exam  Constitutional: She is oriented to person, place, and time and well-developed, well-nourished, and in no distress.  Accompanied by husband.   HENT:  Head: Normocephalic.  Right Ear: No drainage or tenderness. No mastoid tenderness. Tympanic membrane is bulging.  Left Ear: No drainage or tenderness. No  mastoid tenderness. Tympanic membrane is bulging.  Nose: Rhinorrhea present. No sinus tenderness. Right sinus exhibits maxillary sinus tenderness. Right sinus exhibits no frontal sinus tenderness. Left sinus exhibits maxillary sinus tenderness. Left sinus exhibits no frontal sinus tenderness.  Mouth/Throat: Uvula is midline. Mucous membranes are not dry. No oral lesions. Oropharyngeal exudate present.  Eyes: Conjunctivae are normal. Pupils are equal, round, and reactive to light.  Neck: Normal range of motion. Neck supple.  Cardiovascular: Normal rate, regular rhythm and normal heart sounds.  Pulmonary/Chest: Effort normal and breath sounds normal. No respiratory distress. She has no wheezes. She has no rales.  Abdominal: Soft. Bowel sounds are normal. She exhibits no distension. There is no tenderness.  Musculoskeletal: Normal range of motion. She exhibits no edema or deformity.  Lymphadenopathy:    She has no cervical adenopathy.  Neurological: She is alert and oriented to person, place, and time.  Skin: Skin is warm and dry.  Psychiatric: Mood and affect normal.     CMP Latest Ref Rng & Units 04/23/2017  Glucose 65 - 99  mg/dL 107(H)  BUN 6 - 20 mg/dL 14  Creatinine 0.44 - 1.00 mg/dL 0.55  Sodium 135 - 145 mmol/L 137  Potassium 3.5 - 5.1 mmol/L 4.3  Chloride 101 - 111 mmol/L 103  CO2 22 - 32 mmol/L 28  Calcium 8.9 - 10.3 mg/dL 8.8(L)  Total Protein 6.5 - 8.1 g/dL 7.1  Total Bilirubin 0.3 - 1.2 mg/dL 0.5  Alkaline Phos 38 - 126 U/L 117  AST 15 - 41 U/L 16  ALT 14 - 54 U/L 13(L)   CBC Latest Ref Rng & Units 04/23/2017  WBC 3.6 - 11.0 K/uL 2.0(L)  Hemoglobin 12.0 - 16.0 g/dL 9.4(L)  Hematocrit 35.0 - 47.0 % 27.6(L)  Platelets 150 - 440 K/uL 164    No images are attached to the encounter.  Dg Chest 2 View  Result Date: 04/23/2017 CLINICAL DATA:  36 year old female with a 2 week history of cough. History of breast cancer, most recent chemotherapy 1 week previously. EXAM: CHEST   2 VIEW COMPARISON:  Prior chest x-ray 05/31/2014 FINDINGS: Left IJ approach single-lumen power injectable port catheter. The catheter tip overlies the superior cavoatrial junction. The lungs are clear and negative for focal airspace consolidation, pulmonary edema or suspicious pulmonary nodule. No pleural effusion or pneumothorax. Cardiac and mediastinal contours are within normal limits. No acute fracture or lytic or blastic osseous lesions. The visualized upper abdominal bowel gas pattern is unremarkable. IMPRESSION: 1. Well-positioned left IJ approach single-lumen power injectable port catheter. 2. Negative chest x-ray. Electronically Signed   By: Jacqulynn Cadet M.D.   On: 04/23/2017 14:00     Assessment and plan- Patient is a 36 y.o. female who complains of upper respiratory infection like symptoms for approximately 2 weeks gradually worsening. She has currently receiving treatment for Stage I triple negative breast cancer  1. Stage I ER/PR/HER-2/neu negative right breast cancer- currently on neoadjuvant AC with neulasta s/p cycle 3. WBC 2.0 r/t chemotherapy. Anticipate BSO in future.  Follow-up with Dr. Rogue Bussing on 05/01/16 for labs and consideration of AC chemo with  on pro-Neulasta.   2. Acute bacterial sinusitis - symptoms have persisted for approximately 2 weeks with gradual worsening of symptoms. Chest x-ray negative for pneumonia. Afebrile. Appears dehydrated. Will give 1L NaCl today and start on amoxicillin 869m BID for 7 days. Will send script for Tessalon.  Discussed symptom improvement including use of saline nasal spray and/or Flonase with Robitussin and Mucinex for cough and congestion.  Patient to call clinic if symptoms have not improved in 3-4 days.    Visit Diagnosis 1. Acute bacterial sinusitis   2. Malignant neoplasm of upper-inner quadrant of right breast in female, estrogen receptor negative [place]      Patient expressed understanding and was in agreement with this plan.  She also understands that She can call clinic at any time with any questions, concerns, or complaints. Please notify me if you've had no improvement in your symptoms, or symptoms become worse, in the next 3-4 days. A total of (30) minutes of face-to-face time was spent with this patient with greater than 50% of that time in counseling and care-coordination.    LBeckey Rutter DNP, AGNP-C CElnoraat ASt [phone](office) 04/25/17 5:50 PM

## 2017-04-23 NOTE — Telephone Encounter (Signed)
Patient called to report that she has had a cold for 2 weeks and it is getting worse. She has now lost her voice. Denies fever. Nasal drainage, dry hacking cough. Has tried Nyquil and cough drops. Tylenol for headache. Chest hurts not sure if from coughing or something else. Please advise

## 2017-04-23 NOTE — Telephone Encounter (Signed)
Per Alease Medina, NP have patient come in to see her and gets labs and CXR

## 2017-04-23 NOTE — Progress Notes (Signed)
Patient received 1 liter of fluids today in exam room. Patient discharged s/p infusion with stable vitals. She gave verbal understanding to contact our office for worsening symptoms. She will pick up prescriptions (tessalon and amoxicillin) at pharmacy.

## 2017-04-23 NOTE — Telephone Encounter (Signed)
Patient has accepted appointment for CXR, lab and NP

## 2017-04-25 ENCOUNTER — Encounter: Payer: Self-pay | Admitting: Nurse Practitioner

## 2017-05-01 ENCOUNTER — Inpatient Hospital Stay: Attending: Internal Medicine | Admitting: Internal Medicine

## 2017-05-01 ENCOUNTER — Inpatient Hospital Stay

## 2017-05-01 ENCOUNTER — Encounter: Payer: Self-pay | Admitting: *Deleted

## 2017-05-01 VITALS — Resp 18

## 2017-05-01 VITALS — BP 99/64 | HR 76 | Temp 97.4°F | Resp 82 | Wt 141.8 lb

## 2017-05-01 DIAGNOSIS — R509 Fever, unspecified: Secondary | ICD-10-CM | POA: Insufficient documentation

## 2017-05-01 DIAGNOSIS — Z5111 Encounter for antineoplastic chemotherapy: Secondary | ICD-10-CM | POA: Insufficient documentation

## 2017-05-01 DIAGNOSIS — Z171 Estrogen receptor negative status [ER-]: Secondary | ICD-10-CM | POA: Insufficient documentation

## 2017-05-01 DIAGNOSIS — Z1501 Genetic susceptibility to malignant neoplasm of breast: Secondary | ICD-10-CM | POA: Insufficient documentation

## 2017-05-01 DIAGNOSIS — Z801 Family history of malignant neoplasm of trachea, bronchus and lung: Secondary | ICD-10-CM | POA: Diagnosis not present

## 2017-05-01 DIAGNOSIS — D649 Anemia, unspecified: Secondary | ICD-10-CM | POA: Insufficient documentation

## 2017-05-01 DIAGNOSIS — R0981 Nasal congestion: Secondary | ICD-10-CM | POA: Diagnosis not present

## 2017-05-01 DIAGNOSIS — Z806 Family history of leukemia: Secondary | ICD-10-CM | POA: Insufficient documentation

## 2017-05-01 DIAGNOSIS — J45909 Unspecified asthma, uncomplicated: Secondary | ICD-10-CM | POA: Insufficient documentation

## 2017-05-01 DIAGNOSIS — Z7689 Persons encountering health services in other specified circumstances: Secondary | ICD-10-CM | POA: Diagnosis not present

## 2017-05-01 DIAGNOSIS — Z8041 Family history of malignant neoplasm of ovary: Secondary | ICD-10-CM

## 2017-05-01 DIAGNOSIS — M797 Fibromyalgia: Secondary | ICD-10-CM | POA: Insufficient documentation

## 2017-05-01 DIAGNOSIS — Z85820 Personal history of malignant melanoma of skin: Secondary | ICD-10-CM | POA: Insufficient documentation

## 2017-05-01 DIAGNOSIS — C50211 Malignant neoplasm of upper-inner quadrant of right female breast: Secondary | ICD-10-CM | POA: Insufficient documentation

## 2017-05-01 DIAGNOSIS — Z79899 Other long term (current) drug therapy: Secondary | ICD-10-CM | POA: Diagnosis not present

## 2017-05-01 DIAGNOSIS — J069 Acute upper respiratory infection, unspecified: Secondary | ICD-10-CM | POA: Diagnosis not present

## 2017-05-01 DIAGNOSIS — Z803 Family history of malignant neoplasm of breast: Secondary | ICD-10-CM | POA: Insufficient documentation

## 2017-05-01 DIAGNOSIS — Z87891 Personal history of nicotine dependence: Secondary | ICD-10-CM | POA: Diagnosis not present

## 2017-05-01 LAB — CBC WITH DIFFERENTIAL/PLATELET
BASOS ABS: 0.2 10*3/uL — AB (ref 0–0.1)
BASOS PCT: 1 %
EOS ABS: 0.1 10*3/uL (ref 0–0.7)
EOS PCT: 0 %
HEMATOCRIT: 30.9 % — AB (ref 35.0–47.0)
Hemoglobin: 10.4 g/dL — ABNORMAL LOW (ref 12.0–16.0)
Lymphocytes Relative: 9 %
Lymphs Abs: 1.6 10*3/uL (ref 1.0–3.6)
MCH: 31.6 pg (ref 26.0–34.0)
MCHC: 33.7 g/dL (ref 32.0–36.0)
MCV: 93.6 fL (ref 80.0–100.0)
MONO ABS: 1.4 10*3/uL — AB (ref 0.2–0.9)
Monocytes Relative: 8 %
NEUTROS ABS: 13.8 10*3/uL — AB (ref 1.4–6.5)
Neutrophils Relative %: 82 %
PLATELETS: 302 10*3/uL (ref 150–440)
RBC: 3.31 MIL/uL — ABNORMAL LOW (ref 3.80–5.20)
RDW: 15.5 % — AB (ref 11.5–14.5)
WBC: 17 10*3/uL — ABNORMAL HIGH (ref 3.6–11.0)

## 2017-05-01 LAB — COMPREHENSIVE METABOLIC PANEL
ALBUMIN: 4 g/dL (ref 3.5–5.0)
ALK PHOS: 96 U/L (ref 38–126)
ALT: 10 U/L — ABNORMAL LOW (ref 14–54)
ANION GAP: 8 (ref 5–15)
AST: 19 U/L (ref 15–41)
BILIRUBIN TOTAL: 0.4 mg/dL (ref 0.3–1.2)
BUN: 18 mg/dL (ref 6–20)
CALCIUM: 9 mg/dL (ref 8.9–10.3)
CO2: 25 mmol/L (ref 22–32)
CREATININE: 0.62 mg/dL (ref 0.44–1.00)
Chloride: 102 mmol/L (ref 101–111)
GFR calc Af Amer: >60 mL/min (ref >60)
GFR calc non Af Amer: >60 mL/min (ref >60)
GLUCOSE: 106 mg/dL — AB (ref 65–99)
Potassium: 4.2 mmol/L (ref 3.5–5.1)
Sodium: 135 mmol/L (ref 135–145)
TOTAL PROTEIN: 7.1 g/dL (ref 6.5–8.1)

## 2017-05-01 MED ORDER — SODIUM CHLORIDE 0.9 % IV SOLN
Freq: Once | INTRAVENOUS | Status: AC
  Administered 2017-05-01: 10:00:00 via INTRAVENOUS
  Filled 2017-05-01: qty 1000

## 2017-05-01 MED ORDER — SODIUM CHLORIDE 0.9 % IV SOLN
Freq: Once | INTRAVENOUS | Status: AC
  Administered 2017-05-01: 10:00:00 via INTRAVENOUS
  Filled 2017-05-01: qty 5

## 2017-05-01 MED ORDER — PEGFILGRASTIM 6 MG/0.6ML ~~LOC~~ PSKT
6.0000 mg | PREFILLED_SYRINGE | Freq: Once | SUBCUTANEOUS | Status: AC
  Administered 2017-05-01: 6 mg via SUBCUTANEOUS
  Filled 2017-05-01: qty 0.6

## 2017-05-01 MED ORDER — DOXORUBICIN HCL CHEMO IV INJECTION 2 MG/ML
60.0000 mg/m2 | Freq: Once | INTRAVENOUS | Status: AC
  Administered 2017-05-01: 106 mg via INTRAVENOUS
  Filled 2017-05-01: qty 53

## 2017-05-01 MED ORDER — HEPARIN SOD (PORK) LOCK FLUSH 100 UNIT/ML IV SOLN
500.0000 [IU] | Freq: Once | INTRAVENOUS | Status: AC
  Administered 2017-05-01: 500 [IU] via INTRAVENOUS
  Filled 2017-05-01: qty 5

## 2017-05-01 MED ORDER — SODIUM CHLORIDE 0.9 % IV SOLN
1000.0000 mg | Freq: Once | INTRAVENOUS | Status: AC
  Administered 2017-05-01: 1000 mg via INTRAVENOUS
  Filled 2017-05-01: qty 50

## 2017-05-01 MED ORDER — PALONOSETRON HCL INJECTION 0.25 MG/5ML
0.2500 mg | Freq: Once | INTRAVENOUS | Status: AC
  Administered 2017-05-01: 0.25 mg via INTRAVENOUS
  Filled 2017-05-01: qty 5

## 2017-05-01 MED ORDER — SODIUM CHLORIDE 0.9% FLUSH
10.0000 mL | INTRAVENOUS | Status: DC | PRN
  Administered 2017-05-01: 10 mL via INTRAVENOUS
  Filled 2017-05-01: qty 10

## 2017-05-01 NOTE — Assessment & Plan Note (Addendum)
#  Stage I ER/PR/HER-2/neu negative right breast cancer-currently on neoadjuvant chemotherapy- ddAC- Taxol. S/p dd AC cycle#2-tolerating chemotherapy well for possible URI [see discussion below]  #Proceed with cycle #4 of dose dense Adriamycin and Cytoxan today. Labs today reviewed;  acceptable for treatment today-except for elevated white count [17] secondary to growth factor. We will plan to breast ultrasound after cycle #4.  # ? URI-improving.  Continue symptomatic treatment.    #Patient will need also need bilateral salpingo-oophorectomy down the line.  #  3 weeks/MD/carbo-Taxol.US breast prior.

## 2017-05-01 NOTE — Progress Notes (Signed)
[facility] CONSULT NOTE  Patient Care Team: Patient, No Pcp Per as PCP - General (General Practice) Magrinat, Virgie Dad, MD as Consulting Physician (Oncology) Armandina Stammer, MD (Obstetrics and Gynecology) Dillingham, Loel Lofty, DO as Attending Physician (Plastic Surgery) Coralie Keens, MD as Consulting Physician (General Surgery) Cammie Sickle, MD as Consulting Physician (Internal Medicine)  CHIEF COMPLAINTS/PURPOSE OF CONSULTATION:  Breast cancer  #  Oncology History   # OCT 2018- RIGHT BREAST UIQ -[place]; TRIPLE NEGATIVE; ki-67-high [51m-1'O clock-Bx- proven; [place]]; another- 12'O clock-459m Not biopsied. [s/p Dr.Magrinaut; [place]]; cT1c(m) cN0;  G-3; ki-67- 80%  # 11/218- ddAC-Taxol+carbo  # BRCA-1 Gene mutated [mom-ovarian cancer-Stage IIIB/Brca; older sister- breast ca/brca-mutated]  # MUGA scan [nov 2018]- 67%; UPBEAT clinical trial     Malignant neoplasm of upper-inner quadrant of right breast in female, estrogen receptor negative ([place]   HISTORY OF PRESENTING ILLNESS:  Holly Payor37.o.  female with a history of BRCA1 mutation; triple negative stage I breast cancer-currently on neoadjuvant chemotherapy status post dose dense-Adriamycin and Cytoxan status post cycle #3 is here for follow-up.  Continues to complain of nasal congestion.  Dry cough.  No fevers or chills.  Not on antibiotics. Denies any sores in the mouth.  No skin rash.  She feels more tired after chemotherapy.  ROS: A complete 10 point review of system is done which is negative except mentioned above in history of present illness  MEDICAL HISTORY:  Past Medical History:  Diagnosis Date  . Asthma    Excercise induced  . Fibromyalgia   . Malignant neoplasm of upper-inner quadrant of right breast in female, estrogen receptor negative ([place]    SURGICAL HISTORY: Past Surgical History:  Procedure Laterality Date  . IR FLUORO GUIDE PORT INSERTION LEFT  03/17/2017  .  KNEE SURGERY Left    Torn meniscus   . 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 Marital status: Married    Spouse name: Not on file  . Number of children: Not on file  . Years of education: Not on file  . Highest education level: Not on file  Social Needs  . Financial resource strain: Not on file  . Food insecurity - worry: Not on file  . Food insecurity - inability: Not on file  . Transportation needs - medical: Not on file  . Transportation needs - non-medical: Not on file  Occupational History  . Not on file  Tobacco Use  . Smoking status: Former Smoker    Types: Cigarettes    Last attempt to quit: 09/16/2011    Years since quitting: 5.6  . Smokeless tobacco: Never Used  Substance and Sexual Activity  . Alcohol use: No  . Drug use: No  . Sexual activity: Yes  Other Topics Concern  . Not on file  Social History Narrative  . Not on file    FAMILY HISTORY: Family History  Problem Relation Age of Onset  . Cancer Mother        Ovarian cancer (2003), breast cancer(2017) & squamous cell   . Leukemia Mother   . Cancer Sister        breast cancer  . Melanoma Father   . Lung cancer Paternal Grandmother     ALLERGIES:  has No Known Allergies.  MEDICATIONS:  Current Outpatient Medications  Medication Sig Dispense Refill  . amoxicillin (AMOXIL) 87383  MG tablet Take 1 tablet (875 mg total) by mouth 2 (two) times daily. 20 tablet 0  . aspirin-acetaminophen-caffeine (EXCEDRIN MIGRAINE) 384-665-99 MG per tablet Take 1 tablet by mouth as needed for headache.    . lidocaine-prilocaine (EMLA) cream Apply 1 application as needed topically. Apply generously over the Mediport 45 minutes prior to chemotherapy. 30 g 0  . loratadine (CLARITIN) 10 MG tablet Take 10 mg daily by mouth.    . ondansetron (ZOFRAN) 8 MG tablet One pill every 8 hours as needed for nausea/vomitting. 40 tablet 1   . prochlorperazine (COMPAZINE) 10 MG tablet Take 1 tablet (10 mg total) every 6 (six) hours as needed by mouth for nausea or vomiting. 40 tablet 1  . Pseudoephedrine-Naproxen Na (ALEVE-D SINUS & HEADACHE PO) Take 1 each by mouth as needed.    . sertraline (ZOLOFT) 100 MG tablet Take 100 mg by mouth.    . benzonatate (TESSALON PERLES) 100 MG capsule Take 2 capsules (200 mg total) by mouth 3 (three) times daily as needed for cough. (Patient not taking: Reported on 05/01/2017) 30 capsule 0   No current facility-administered medications for this visit.       Marland Kitchen  PHYSICAL EXAMINATION: ECOG PERFORMANCE STATUS: 0 - Asymptomatic  Vitals:   05/01/17 0910  BP: 99/64  Pulse: 76  Resp: (!) 82  Temp: (!) 97.4 F (36.3 C)   Filed Weights   05/01/17 0910  Weight: 141 lb 12.8 oz (64.3 kg)    GENERAL: Well-nourished well-developed; Alert, no distress and comfortable.   Accompanied by her husband. EYES: no pallor or icterus OROPHARYNX: no thrush or ulceration; good dentition  NECK: supple, no masses felt LYMPH:  no palpable lymphadenopathy in the cervical, axillary or inguinal regions LUNGS: clear to auscultation and  No wheeze or crackles HEART/CVS: regular rate & rhythm and no murmurs; No lower extremity edema ABDOMEN: abdomen soft, non-tender and normal bowel sounds Musculoskeletal:no cyanosis of digits and no clubbing  PSYCH: alert & oriented x 3 with fluent speech NEURO: no focal motor/sensory deficits SKIN:  no rashes or significant lesions;  LABORATORY DATA:  I have reviewed the data as listed Lab Results  Component Value Date   WBC 17.0 (H) 05/01/2017   HGB 10.4 (L) 05/01/2017   HCT 30.9 (L) 05/01/2017   MCV 93.6 05/01/2017   PLT 302 05/01/2017   Recent Labs    04/16/17 0930 04/23/17 1402 05/01/17 0856  NA 133* 137 135  K 4.1 4.3 4.2  CL 100* 103 102  CO2 '24 28 25  ' GLUCOSE 124* 107* 106*  BUN '14 14 18  ' CREATININE 0.56 0.55 0.62  CALCIUM 8.9 8.8* 9.0  GFRNONAA >60  >60 >60  GFRAA >60 >60 >60  PROT 7.4 7.1 7.1  ALBUMIN 4.0 3.8 4.0  AST '20 16 19  ' ALT 11* 13* 10*  ALKPHOS 104 117 96  BILITOT 0.3 0.5 0.4    RADIOGRAPHIC STUDIES: I have personally reviewed the radiological images as listed and agreed with the findings in the report. Dg Chest 2 View  Result Date: 04/23/2017 CLINICAL DATA:  37 year old female with a 2 week history of cough. History of breast cancer, most recent chemotherapy 1 week previously. EXAM: CHEST  2 VIEW COMPARISON:  Prior chest x-ray 05/31/2014 FINDINGS: Left IJ approach single-lumen power injectable port catheter. The catheter tip overlies the superior cavoatrial junction. The lungs are clear and negative for focal airspace consolidation, pulmonary edema or suspicious pulmonary nodule. No pleural effusion or pneumothorax. Cardiac  and mediastinal contours are within normal limits. No acute fracture or lytic or blastic osseous lesions. The visualized upper abdominal bowel gas pattern is unremarkable. IMPRESSION: 1. Well-positioned left IJ approach single-lumen power injectable port catheter. 2. Negative chest x-ray. Electronically Signed   By: Jacqulynn Cadet M.D.   On: 04/23/2017 14:00    ASSESSMENT & PLAN:   Malignant neoplasm of upper-inner quadrant of right breast in female, estrogen receptor negative ([place]) # Stage I ER/PR/HER-2/neu negative right breast cancer-currently on neoadjuvant chemotherapy- ddAC- Taxol. S/p dd AC cycle#2-tolerating chemotherapy well for possible URI [see discussion below]  #Proceed with cycle #4 of dose dense Adriamycin and Cytoxan today. Labs today reviewed;  acceptable for treatment today-except for elevated white count [17] secondary to growth factor. We will plan to breast ultrasound after cycle #4.  # ? URI-improving.  Continue symptomatic treatment.    #Patient will need also need bilateral salpingo-oophorectomy down the line.  #  3 weeks/MD/carbo-Taxol.US breast prior.   All questions were  answered. The patient knows to call the clinic with any problems, questions or concerns.    Cammie Sickle, MD 05/01/2017 3:56 PM

## 2017-05-08 ENCOUNTER — Inpatient Hospital Stay
Admission: RE | Admit: 2017-05-08 | Discharge: 2017-05-08 | Disposition: A | Discharge status: Home / Self Care | Payer: Self-pay | Source: Ambulatory Visit | Attending: *Deleted | Admitting: *Deleted

## 2017-05-08 ENCOUNTER — Other Ambulatory Visit: Payer: Self-pay | Admitting: *Deleted

## 2017-05-08 DIAGNOSIS — Z9289 Personal history of other medical treatment: Secondary | ICD-10-CM

## 2017-05-12 ENCOUNTER — Inpatient Hospital Stay

## 2017-05-12 ENCOUNTER — Inpatient Hospital Stay (HOSPITAL_BASED_OUTPATIENT_CLINIC_OR_DEPARTMENT_OTHER): Admitting: Nurse Practitioner

## 2017-05-12 ENCOUNTER — Ambulatory Visit
Admission: RE | Admit: 2017-05-12 | Discharge: 2017-05-12 | Disposition: A | Discharge status: Home / Self Care | Source: Ambulatory Visit | Attending: Nurse Practitioner | Admitting: Nurse Practitioner

## 2017-05-12 ENCOUNTER — Other Ambulatory Visit: Payer: Self-pay

## 2017-05-12 ENCOUNTER — Other Ambulatory Visit: Payer: Self-pay | Admitting: *Deleted

## 2017-05-12 ENCOUNTER — Telehealth: Payer: Self-pay | Admitting: *Deleted

## 2017-05-12 VITALS — BP 100/66 | HR 114 | Temp 100.5°F | Resp 22 | Ht 67.0 in | Wt 139.0 lb

## 2017-05-12 DIAGNOSIS — Z171 Estrogen receptor negative status [ER-]: Principal | ICD-10-CM

## 2017-05-12 DIAGNOSIS — C50211 Malignant neoplasm of upper-inner quadrant of right female breast: Secondary | ICD-10-CM

## 2017-05-12 DIAGNOSIS — J069 Acute upper respiratory infection, unspecified: Secondary | ICD-10-CM

## 2017-05-12 DIAGNOSIS — Z5189 Encounter for other specified aftercare: Secondary | ICD-10-CM

## 2017-05-12 DIAGNOSIS — R509 Fever, unspecified: Secondary | ICD-10-CM | POA: Diagnosis not present

## 2017-05-12 DIAGNOSIS — Z5111 Encounter for antineoplastic chemotherapy: Secondary | ICD-10-CM | POA: Diagnosis not present

## 2017-05-12 DIAGNOSIS — Z95828 Presence of other vascular implants and grafts: Secondary | ICD-10-CM

## 2017-05-12 LAB — COMPREHENSIVE METABOLIC PANEL
ALBUMIN: 4.2 g/dL (ref 3.5–5.0)
ALK PHOS: 118 U/L (ref 38–126)
ALT: 12 U/L — ABNORMAL LOW (ref 14–54)
ANION GAP: 10 (ref 5–15)
AST: 18 U/L (ref 15–41)
BUN: 15 mg/dL (ref 6–20)
CALCIUM: 9.4 mg/dL (ref 8.9–10.3)
CHLORIDE: 99 mmol/L — AB (ref 101–111)
CO2: 27 mmol/L (ref 22–32)
Creatinine, Ser: 0.79 mg/dL (ref 0.44–1.00)
GFR calc Af Amer: >60 mL/min (ref >60)
GFR calc non Af Amer: >60 mL/min (ref >60)
GLUCOSE: 112 mg/dL — AB (ref 65–99)
Potassium: 4 mmol/L (ref 3.5–5.1)
Sodium: 136 mmol/L (ref 135–145)
Total Bilirubin: 0.4 mg/dL (ref 0.3–1.2)
Total Protein: 7.8 g/dL (ref 6.5–8.1)

## 2017-05-12 LAB — INFLUENZA PANEL BY PCR (TYPE A & B)
INFLAPCR: NEGATIVE
Influenza B By PCR: NEGATIVE

## 2017-05-12 LAB — CBC WITH DIFFERENTIAL/PLATELET
BASOS PCT: 1 %
Basophils Absolute: 0.2 10*3/uL — ABNORMAL HIGH (ref 0–0.1)
EOS ABS: 0 10*3/uL (ref 0–0.7)
EOS PCT: 0 %
HEMATOCRIT: 31.1 % — AB (ref 35.0–47.0)
HEMOGLOBIN: 10.4 g/dL — AB (ref 12.0–16.0)
LYMPHS PCT: 5 %
Lymphs Abs: 0.9 10*3/uL — ABNORMAL LOW (ref 1.0–3.6)
MCH: 31.6 pg (ref 26.0–34.0)
MCHC: 33.4 g/dL (ref 32.0–36.0)
MCV: 94.6 fL (ref 80.0–100.0)
MONOS PCT: 12 %
Monocytes Absolute: 2.2 10*3/uL — ABNORMAL HIGH (ref 0.2–0.9)
NEUTROS ABS: 14.7 10*3/uL — AB (ref 1.4–6.5)
NEUTROS PCT: 82 %
Platelets: 290 10*3/uL (ref 150–440)
RBC: 3.29 MIL/uL — ABNORMAL LOW (ref 3.80–5.20)
RDW: 16.7 % — ABNORMAL HIGH (ref 11.5–14.5)
WBC: 18 10*3/uL — ABNORMAL HIGH (ref 3.6–11.0)

## 2017-05-12 LAB — LACTIC ACID, PLASMA: LACTIC ACID, VENOUS: 1.4 mmol/L (ref 0.5–1.9)

## 2017-05-12 MED ORDER — HEPARIN SOD (PORK) LOCK FLUSH 100 UNIT/ML IV SOLN
500.0000 [IU] | Freq: Once | INTRAVENOUS | Status: AC
  Administered 2017-05-12: 500 [IU]
  Filled 2017-05-12: qty 5

## 2017-05-12 MED ORDER — SODIUM CHLORIDE 0.9 % IV SOLN
Freq: Once | INTRAVENOUS | Status: AC
  Administered 2017-05-12: 15:00:00 via INTRAVENOUS
  Filled 2017-05-12: qty 1000

## 2017-05-12 MED ORDER — ACETAMINOPHEN 500 MG PO TABS
1000.0000 mg | ORAL_TABLET | Freq: Once | ORAL | Status: AC
  Administered 2017-05-12: 1000 mg via ORAL
  Filled 2017-05-12: qty 2

## 2017-05-12 MED ORDER — HYDROCODONE-HOMATROPINE 5-1.5 MG/5ML PO SYRP: 5.0000 mL | ORAL_SOLUTION | Freq: Four times a day (QID) | ORAL | 0 refills | Status: DC | PRN

## 2017-05-12 MED ORDER — ALBUTEROL SULFATE (2.5 MG/3ML) 0.083% IN NEBU
2.5000 mg | INHALATION_SOLUTION | Freq: Once | RESPIRATORY_TRACT | Status: AC
  Administered 2017-05-12: 2.5 mg via RESPIRATORY_TRACT
  Filled 2017-05-12: qty 3

## 2017-05-12 MED ORDER — LEVOFLOXACIN 750 MG PO TABS: 750.0000 mg | ORAL_TABLET | Freq: Every day | ORAL | 0 refills | Status: DC

## 2017-05-12 MED ORDER — CROMOLYN SODIUM 5.2 MG/ACT NA AERS: 1.0000 | INHALATION_SPRAY | Freq: Four times a day (QID) | NASAL | 0 refills | Status: AC | PRN

## 2017-05-12 MED ORDER — SODIUM CHLORIDE 0.9% FLUSH
10.0000 mL | Freq: Once | INTRAVENOUS | Status: AC
  Administered 2017-05-12: 10 mL via INTRAVENOUS
  Filled 2017-05-12: qty 10

## 2017-05-12 NOTE — Patient Instructions (Signed)
Fever, Adult A fever is an increase in the body's temperature. It is usually defined as a temperature of 100F (38C) or higher. Brief mild or moderate fevers generally have no long-term effects, and they often do not require treatment. Moderate or high fevers may make you feel uncomfortable and can sometimes be a sign of a serious illness or disease. The sweating that may occur with repeated or prolonged fever may also cause dehydration. Fever is confirmed by taking a temperature with a thermometer. A measured temperature can vary with:  Age.  Time of day.  Location of the thermometer: ? Mouth (oral). ? Rectum (rectal). ? Ear (tympanic). ? Underarm (axillary). ? Forehead (temporal).  Follow these instructions at home: Pay attention to any changes in your symptoms. Take these actions to help with your condition:  Take over-the counter and prescription medicines only as told by your health care provider. Follow the dosing instructions carefully.  If you were prescribed an antibiotic medicine, take it as told by your health care provider. Do not stop taking the antibiotic even if you start to feel better.  Rest as needed.  Drink enough fluid to keep your urine clear or pale yellow. This helps to prevent dehydration.  Sponge yourself or bathe with room-temperature water to help reduce your body temperature as needed. Do not use ice water.  Do not overbundle yourself in blankets or heavy clothes.  Contact a health care provider if:  You vomit.  You cannot eat or drink without vomiting.  You have diarrhea.  You have pain when you urinate.  Your symptoms do not improve with treatment.  You develop new symptoms.  You develop excessive weakness. Get help right away if:  You have shortness of breath or have trouble breathing.  You are dizzy or you faint.  You are disoriented or confused.  You develop signs of dehydration, such as a dry mouth, decreased urination, or  paleness.  You develop severe pain in your abdomen.  You have persistent vomiting or diarrhea.  You develop a skin rash.  Your symptoms suddenly get worse. This information is not intended to replace advice given to you by your health care provider. Make sure you discuss any questions you have with your health care provider. Document Released: 10/09/2000 Document Revised: 09/21/2015 Document Reviewed: 06/09/2014 Elsevier Interactive Patient Education  2018 [employer]. Levofloxacin tablets What is this medicine? LEVOFLOXACIN (lee voe FLOX a sin) is a quinolone antibiotic. It is used to treat certain kinds of bacterial infections. It will not work for colds, flu, or other viral infections. This medicine may be used for other purposes; ask your health care provider or pharmacist if you have questions. COMMON BRAND NAME(S): Levaquin, Levaquin Leva-Pak What should I tell my health care provider before I take this medicine? They need to know if you have any of these conditions: -bone problems -diabetes -history of low levels of potassium in the blood -irregular heartbeat -joint problems -kidney disease -liver disease -myasthenia gravis -seizures -tendon problems -tingling of the fingers or toes, or other nerve disorder -an unusual or allergic reaction to levofloxacin, other quinolone antibiotics, foods, dyes, or preservatives -pregnant or trying to get pregnant -breast-feeding How should I use this medicine? Take this medicine by mouth with a full glass of water. Follow the directions on the prescription label. This medicine can be taken with or without food. Take your medicine at regular intervals. Do not take your medicine more often than directed. Do not skip doses or  stop your medicine early even if you feel better. Do not stop taking except on your doctor's advice. A special MedGuide will be given to you by the pharmacist with each prescription and refill. Be sure to read this  information carefully each time. Talk to your pediatrician regarding the use of this medicine in children. While this drug may be prescribed for children as young as 6 months for selected conditions, precautions do apply. Overdosage: If you think you have taken too much of this medicine contact a poison control center or emergency room at once. NOTE: This medicine is only for you. Do not share this medicine with others. What if I miss a dose? If you miss a dose, take it as soon as you remember. If it is almost time for your next dose, take only that dose. Do not take double or extra doses. What may interact with this medicine? Do not take this medicine with any of the following medications: -bepridil -certain medicines for depression, anxiety, or psychotic disturbances like pimozide, thioridazine, and ziprasidone -certain medicines for irregular heart beat like dofetilide and dronedarone -cisapride -halofantrine This medicine may also interact with the following medications: -antacids -birth control pills -certain medicines for diabetes, like glipizide, glyburide, or insulin -didanosine buffered tablets or powder -multivitamins -NSAIDS, medicines for pain and inflammation, like ibuprofen or naproxen -steroid medicines like prednisone or cortisone -sucralfate -theophylline -warfarin This list may not describe all possible interactions. Give your health care provider a list of all the medicines, herbs, non-prescription drugs, or dietary supplements you use. Also tell them if you smoke, drink alcohol, or use illegal drugs. Some items may interact with your medicine. What should I watch for while using this medicine? Tell your doctor or healthcare professional if your symptoms do not start to get better or if they get worse. Do not treat diarrhea with over the counter products. Contact your doctor if you have diarrhea that lasts more than 2 days or if it is severe and watery. Check with your  doctor or health care professional if you get an attack of severe diarrhea, nausea and vomiting, or if you sweat a lot. The loss of too much body fluid can make it dangerous for you to take this medicine. You may get drowsy or dizzy. Do not drive, use machinery, or do anything that needs mental alertness until you know how this medicine affects you. Do not sit or stand up quickly, especially if you are an older patient. This reduces the risk of dizzy or fainting spells. This medicine can make you more sensitive to the sun. Keep out of the sun. If you cannot avoid being in the sun, wear protective clothing and use a sunscreen. Do not use sun lamps or tanning beds/booths. Contact your doctor if you get a sunburn. If you are a diabetic monitor your blood glucose carefully. If you get an unusual reading stop taking this medicine and call your doctor right away. Avoid antacids, calcium, iron, and zinc products for 2 hours before and 2 hours after taking a dose of this medicine. What side effects may I notice from receiving this medicine? Side effects that you should report to your doctor or health care professional as soon as possible: -allergic reactions like skin rash or hives, swelling of the face, lips, or tongue -anxious -breathing problems -confusion -depressed mood -diarrhea -dizziness -fast, irregular heartbeat -hallucination, loss of contact with reality -joint, muscle, or tendon pain or swelling -muscle weakness -pain, tingling, numbness in the  hands or feet -seizures -signs and symptoms of high blood sugar such as dizziness; dry mouth; dry skin; fruity breath; nausea; stomach pain; increased hunger or thirst; increased urination -signs and symptoms of liver injury like dark yellow or brown urine; general ill feeling or flu-like symptoms; light-colored stools; loss of appetite; nausea; right upper belly pain; unusually weak or tired; yellowing of the eyes or skin -signs and symptoms of low  blood sugar such as feeling anxious; confusion; dizziness; increased hunger; unusually weak or tired; sweating; shakiness; cold; irritable; headache; blurred vision; fast heartbeat; loss of consciousness -suicidal thoughts or other mood changes -sunburn -unusually weak or tired Side effects that usually do not require medical attention (report to your doctor or health care professional if they continue or are bothersome): -constipation -dry mouth -headache -nausea, vomiting -trouble sleeping This list may not describe all possible side effects. Call your doctor for medical advice about side effects. You may report side effects to FDA at 1-800-FDA-1088. Where should I keep my medicine? Keep out of the reach of children. Store at room temperature between 15 and 30 degrees C (59 and 86 degrees F). Keep in a tightly closed container. Throw away any unused medicine after the expiration date. NOTE: This sheet is a summary. It may not cover all possible information. If you have questions about this medicine, talk to your doctor, pharmacist, or health care provider.  2018 Elsevier/Gold Standard (2015-10-24 12:38:27) Homatropine; Hydrocodone oral syrup What is this medicine? HYDROCODONE (hye droe KOE done) is used to help relieve cough. This medicine may be used for other purposes; ask your health care provider or pharmacist if you have questions. COMMON BRAND NAME(S): Hycodan, Hydromet, Hydropane, Mycodone What should I tell my health care provider before I take this medicine? They need to know if you have any of these conditions: -brain tumor -drug abuse or addiction -head injury -heart disease -if you frequently drink alcohol-containing drinks -kidney disease -liver disease -lung disease, asthma, or breathing problems -mental problems -an allergic reaction to hydrocodone, other opioid analgesics, other medicines, foods, dyes, or preservatives -pregnant or trying to get  pregnant -breast-feeding How should I use this medicine? Take this medicine by mouth. Follow the directions on the prescription label. Use a specially marked spoon or container to measure each dose. Ask your pharmacist if you do not have one. Household spoons are not accurate. You can take it with or without food. If it upsets your stomach, take it with food. Take your medicine at regular intervals. Do not take it more often than directed. A special MedGuide will be given to you by the pharmacist with each prescription and refill. Be sure to read this information carefully each time. Talk to your pediatrician regarding the use of this medicine in children. This medicine is not approved for use in children. Overdosage: If you think you have taken too much of this medicine contact a poison control center or emergency room at once. NOTE: This medicine is only for you. Do not share this medicine with others. What if I miss a dose? If you miss a dose, take it as soon as you can. If it is almost time for your next dose, take only that dose. Do not take double or extra doses. What may interact with this medicine? Do not take this medicine with any of the following medications: -alcohol -antihistamines for allergy, cough and cold -certain medicines for anxiety or sleep -certain medicines for depression like amitriptyline, fluoxetine, sertraline -certain medicines for  seizures like carbamazepine, phenobarbital, phenytoin, primidone -general anesthetics like halothane, isoflurane, methoxyflurane, propofol -local anesthetics like lidocaine, pramoxine, tetracaine -MAOIs like Carbex, Eldepryl, Marplan, Nardil, and Parnate -other narcotic medicines (opiates) for pain or cough -phenothiazines like chlorpromazine, mesoridazine, prochlorperazine, thioridazine This medicine may also interact with the following medications: -antiviral medicines for HIV and AIDS -atropine -certain antibiotics like  clarithromycin, erythromycin -certain medicines for bladder problems like oxybutynin, tolterodine -certain medicines for fungal infections like ketoconazole and itraconazole -certain medicines for Parkinson's disease like benztropine, trihexyphenidyl -certain medicines for stomach problems like dicyclomine, hyoscyamine -certain medicines for travel sickness like scopolamine -ipratropium -rifampin This list may not describe all possible interactions. Give your health care provider a list of all the medicines, herbs, non-prescription drugs, or dietary supplements you use. Also tell them if you smoke, drink alcohol, or use illegal drugs. Some items may interact with your medicine. What should I watch for while using this medicine? Use exactly as directed by your doctor or health care professional. Do not take more than the recommended dose. You may develop tolerance to this medicine if you take it for a long time. Tolerance means that you will get less cough relief with time. Tell your doctor or health care professional if your symptoms do not improve or if they get worse. If you have been taking this medicine for a long time, do not suddenly stop taking it because you may develop a severe reaction. Your body becomes used to the medicine. This does NOT mean you are addicted. Addiction is a behavior related to getting and using a drug for a nonmedical reason. If your doctor wants you to stop the medicine, the dose will be slowly lowered over time to avoid any side effects. There are different types of narcotic medicines (opiates). If you take more than one type at the same time or if you are taking another medicine that also causes drowsiness, you may have more side effects. Give your health care provider a list of all medicines you use. Your doctor will tell you how much medicine to take. Do not take more medicine than directed. Call emergency for help if you have problems breathing or unusual  sleepiness. You may get drowsy or dizzy. Do not drive, use machinery, or do anything that needs mental alertness until you know how this medicine affects you. Do not stand or sit up quickly, especially if you are an older patient. This reduces the risk of dizzy or fainting spells. Alcohol may interfere with the effect of this medicine. Avoid alcoholic drinks. This medicine will cause constipation. Try to have a bowel movement at least every 2 to 3 days. If you do not have a bowel movement for 3 days, call your doctor or health care professional. Your mouth may get dry. Chewing sugarless gum or sucking hard candy, and drinking plenty of water may help. Contact your doctor if the problem does not go away or is severe. What side effects may I notice from receiving this medicine? Side effects that you should report to your doctor or health care professional as soon as possible: -allergic reactions like skin rash, itching or hives, swelling of the face, lips, or tongue -breathing problems -confusion -signs and symptoms of low blood pressure like dizziness; feeling faint or lightheaded, falls; unusually weak or tired -trouble passing urine or change in the amount of urine Side effects that usually do not require medical attention (report to your doctor or health care professional if they continue or are  bothersome): -constipation -dry mouth -nausea, vomiting -tiredness This list may not describe all possible side effects. Call your doctor for medical advice about side effects. You may report side effects to FDA at 1-800-FDA-1088. Where should I keep my medicine? Keep out of the reach of children. This medicine can be abused. Keep your medicine in a safe place to protect it from theft. Do not share this medicine with anyone. Selling or giving away this medicine is dangerous and against the law. This medicine may cause accidental overdose and death if taken by other adults, children, or pets. Mix any  unused medicine with a substance like cat littler or coffee grounds. Then throw the medicine away in a sealed container like a sealed bag or a coffee can with a lid. Do not use the medicine after the expiration date. Store at room temperature between 15 and 30 degrees C (59 and 86 degrees F). Protect from light. NOTE: This sheet is a summary. It may not cover all possible information. If you have questions about this medicine, talk to your doctor, pharmacist, or health care provider.  2018 Elsevier/Gold Standard (2016-05-09 23:16:08)

## 2017-05-12 NOTE — Telephone Encounter (Signed)
Patient agrees to appointment at [facility] for lab then see NP

## 2017-05-12 NOTE — Progress Notes (Signed)
Symptom Management Consult note [facility]  [phone] [phone]  Patient Care Team: Patient, No Pcp Per as PCP - General (General Practice) Magrinat, Virgie Dad, MD as Consulting Physician (Oncology) Armandina Stammer, MD (Obstetrics and Gynecology) Dillingham, Loel Lofty, DO as Attending Physician (Plastic Surgery) Coralie Keens, MD as Consulting Physician (General Surgery) Cammie Sickle, MD as Consulting Physician (Internal Medicine)   Name of the patient: Holly Parker  [id number]  [birth year]   Date of visit: 05/12/17  Diagnosis- Stage I ER/PR/HER-2/neu negative right breast cancer  Chief complaint/ Reason for visit- Cough, Fever  Heme/Onc history: Most recently seen by primary oncologist, Dr. Rogue Bussing, on 05/01/17.  She has a history of BRCA1 mutation, triple negative stage I breast cancer, currently on neoadjuvant chemotherapy, dose dense Adriamycin and Cytoxan with Neulasta support s/p cycle 4. MUGA scan 02/2017 67%, UPBEAT clinical trial.   Interval history-patient presents to symptom management clinic today for complaints of fever up to 101 at home.  She noticed 'cold-like' symptoms approximately 1 week ago and fever developed in past 2 days. Associated symptoms: Body aches, chills, fatigue, runny nose, cough.  Denies abdominal pain, diarrhea, or rash.  Symptoms are worse at night and are impairing sleep because of coughing.  Reduced oral intake.  Urine output is stable.  She has not taken Tylenol she was concerned of masking the fever.  Has taken Robitussin and Tessalon without improvement in symptoms.  Has small children at home but denies sick contacts.  Immunocompromised and currently receiving chemotherapy. Was recently treated for sinus infection with amoxicillin.  States that symptoms resolved with antibiotics.  ECOG FS:1 - Symptomatic but completely ambulatory  Review of systems- Review of Systems    Constitutional: Positive for chills, fever and malaise/fatigue.  HENT: Positive for congestion and sinus pain. Negative for ear pain, hearing loss and nosebleeds.   Eyes:       Itching eyes  Respiratory: Positive for cough, sputum production and shortness of breath. Negative for hemoptysis and wheezing.   Cardiovascular: Negative for chest pain, palpitations, orthopnea and claudication.  Gastrointestinal: Positive for nausea.  Genitourinary: Negative.   Musculoskeletal: Positive for myalgias.  Skin: Negative for rash.  Neurological: Negative.  Negative for weakness.  Endo/Heme/Allergies: Negative.   Psychiatric/Behavioral: Negative.      Current treatment- s/p cycle 4 Adriamycin & Cytoxan with Neulasta OnPro (05/01/17)  No Known Allergies   Past Medical History:  Diagnosis Date  . Asthma    Excercise induced  . Fibromyalgia   . Malignant neoplasm of upper-inner quadrant of right breast in female, estrogen receptor negative [facility])      Past Surgical History:  Procedure Laterality Date  . IR FLUORO GUIDE PORT INSERTION LEFT  03/17/2017  . KNEE SURGERY Left    Torn meniscus   . RHINOPLASTY      Social History   Socioeconomic History  . Marital status: Married    Spouse name: Not on file  . Number of children: Not on file  . Years of education: Not on file  . Highest education level: Not on file  Social Needs  . Financial resource strain: Not on file  . Food insecurity - worry: Not on file  . Food insecurity - inability: Not on file  . Transportation needs - medical: Not on file  . Transportation needs - non-medical: Not on file  Occupational History  . Not on file  Tobacco Use  . Smoking status: Former Smoker  Types: Cigarettes    Last attempt to quit: 09/16/2011    Years since quitting: 5.6  . Smokeless tobacco: Never Used  Substance and Sexual Activity  . Alcohol use: No  . Drug use: No  . Sexual activity: Yes  Other Topics Concern  . Not on file   Social History Narrative  . Not on file    Family History  Problem Relation Age of Onset  . Cancer Mother        Ovarian cancer (2003), breast cancer(2017) & squamous cell   . Leukemia Mother   . Cancer Sister        breast cancer  . Melanoma Father   . Lung cancer Paternal Grandmother      Current Outpatient Medications:  .  benzonatate (TESSALON PERLES) 100 MG capsule, Take 2 capsules (200 mg total) by mouth 3 (three) times daily as needed for cough., Disp: 30 capsule, Rfl: 0 .  DM-Doxylamine-Acetaminophen (ROBITUSSIN COLD+FLU NIGHTTIME PO), Take 15 mLs by mouth at bedtime., Disp: , Rfl:  .  DM-Phenylephrine-Acetaminophen (ROBITUSSIN COLD+FLU DAYTIME PO), Take 15 mLs by mouth every 6 (six) hours., Disp: , Rfl:  .  lidocaine-prilocaine (EMLA) cream, Apply 1 application as needed topically. Apply generously over the Mediport 45 minutes prior to chemotherapy., Disp: 30 g, Rfl: 0 .  loratadine (CLARITIN) 10 MG tablet, Take 10 mg daily by mouth., Disp: , Rfl:  .  sertraline (ZOLOFT) 100 MG tablet, Take 100 mg by mouth., Disp: , Rfl:  .  aspirin-acetaminophen-caffeine (EXCEDRIN MIGRAINE) 161-096-04 MG per tablet, Take 1 tablet by mouth as needed for headache., Disp: , Rfl:  .  ondansetron (ZOFRAN) 8 MG tablet, One pill every 8 hours as needed for nausea/vomitting. (Patient not taking: Reported on 05/12/2017), Disp: 40 tablet, Rfl: 1 .  prochlorperazine (COMPAZINE) 10 MG tablet, Take 1 tablet (10 mg total) every 6 (six) hours as needed by mouth for nausea or vomiting. (Patient not taking: Reported on 05/12/2017), Disp: 40 tablet, Rfl: 1 .  Pseudoephedrine-Naproxen Na (ALEVE-D SINUS & HEADACHE PO), Take 1 each by mouth as needed., Disp: , Rfl:   Physical exam:  Vitals:   05/12/17 1358  BP: 100/66  Pulse: (!) 114  Temp: (!) 100.5 F (38.1 C)  TempSrc: Tympanic  Weight: 139 lb (63 kg)  Height: '5\' 7"'  (1.702 m)   Physical Exam  Constitutional: She is oriented to person, place, and time.   Uncomfortable appearing  HENT:  Head: Normocephalic and atraumatic.  Right Ear: External ear normal. No mastoid tenderness. Tympanic membrane is not injected, not erythematous and not bulging.  Left Ear: External ear normal. No mastoid tenderness. Tympanic membrane is not injected, not erythematous and not bulging.  Nose: Rhinorrhea present. Right sinus exhibits frontal sinus tenderness. Left sinus exhibits frontal sinus tenderness.  Mouth/Throat: Oropharyngeal exudate (clear) present.  Eyes: Conjunctivae are normal. Right eye exhibits no discharge. Left eye exhibits no discharge.  Neck: Normal range of motion. Neck supple.  Cardiovascular: Regular rhythm. Tachycardia present.  Pulmonary/Chest:  Wet, thick, cough- throughout exam  Abdominal: Soft. Bowel sounds are normal.  Vomited during exam d/t coughing  Musculoskeletal: She exhibits no edema or deformity.  Lymphadenopathy:    She has no cervical adenopathy.  Neurological: She is alert and oriented to person, place, and time.  Skin: Skin is warm and dry.  Psychiatric: Affect normal.     CMP Latest Ref Rng & Units 05/12/2017  Glucose 65 - 99 mg/dL 112(H)  BUN 6 - 20 mg/dL  15  Creatinine 0.44 - 1.00 mg/dL 0.79  Sodium 135 - 145 mmol/L 136  Potassium 3.5 - 5.1 mmol/L 4.0  Chloride 101 - 111 mmol/L 99(L)  CO2 22 - 32 mmol/L 27  Calcium 8.9 - 10.3 mg/dL 9.4  Total Protein 6.5 - 8.1 g/dL 7.8  Total Bilirubin 0.3 - 1.2 mg/dL 0.4  Alkaline Phos 38 - 126 U/L 118  AST 15 - 41 U/L 18  ALT 14 - 54 U/L 12(L)   CBC Latest Ref Rng & Units 05/12/2017  WBC 3.6 - 11.0 K/uL 18.0(H)  Hemoglobin 12.0 - 16.0 g/dL 10.4(L)  Hematocrit 35.0 - 47.0 % 31.1(L)  Platelets 150 - 440 K/uL 290   Lactic Acid, Venous    Component Value Date/Time   LATICACIDVEN 1.4 05/12/2017 1335   05/12/17- 1511 Influenza A by PCR- negative Influenza B by PCR- Negative    Dg Chest 2 View  Result Date: 05/12/2017 CLINICAL DATA:  Cough for 4 days, fever for 2  days, history breast cancer, most recent chemotherapy 2 weeks ago EXAM: CHEST  2 VIEW COMPARISON:  04/23/2017 FINDINGS: LEFT jugular Port-A-Cath with tip projecting over cavoatrial junction. Normal heart size, mediastinal contours, and pulmonary vascularity. Lungs hyperinflated but clear. No pleural effusion or pneumothorax. Bones unremarkable. IMPRESSION: No acute abnormalities. Electronically Signed   By: Lavonia Dana M.D.   On: 05/12/2017 16:32   Dg Chest 2 View  Result Date: 04/23/2017 CLINICAL DATA:  37 year old female with a 2 week history of cough. History of breast cancer, most recent chemotherapy 1 week previously. EXAM: CHEST  2 VIEW COMPARISON:  Prior chest x-ray 05/31/2014 FINDINGS: Left IJ approach single-lumen power injectable port catheter. The catheter tip overlies the superior cavoatrial junction. The lungs are clear and negative for focal airspace consolidation, pulmonary edema or suspicious pulmonary nodule. No pleural effusion or pneumothorax. Cardiac and mediastinal contours are within normal limits. No acute fracture or lytic or blastic osseous lesions. The visualized upper abdominal bowel gas pattern is unremarkable. IMPRESSION: 1. Well-positioned left IJ approach single-lumen power injectable port catheter. 2. Negative chest x-ray. Electronically Signed   By: Jacqulynn Cadet M.D.   On: 04/23/2017 14:00   Mm Outside Films Mammo  Result Date: 05/08/2017 This examination belongs to an outside facility and is stored here for comparison purposes only.  Contact the originating outside institution for any associated report or interpretation.    Assessment and plan- Patient is a 37 y.o. female who presents to Symptom Management Clinic for fever and cough.    1. Cough and Fever- Fever today 100.5. No neutropenia. Symptoms appeared after last chemo and have gradually worsened. Recent antibiotic use for sinus infection. Negative flu. RVP pending. WBC 18, ANC 14.7- consistent w/ recent  neulasta. Lactic 1.4, blood cultures pending. Chest x-ray negative for pneumonia. 1L NaCl and albuterol nebulizer today in clinic. Will send levaquin x5 days and hycodan for cough. Discussed use of otc medications for symptoms. The maximum daily dose of acetaminophen was discussed with the patient. She was encouraged not to exceed 3,000 mg of acetaminophen during a 24 hour period and was asked to keep in mind that acetaminophen can also be found in many over-the-counter cold medications as well as narcotics that may be given for pain. The patient expresses understanding of these issues and questions were answered. Encouraged intake of water to maintain hydration. Patient advised to notify the clinic if there is no improvement in symptoms or if symptoms worsen in next 3-4 days.  Follow-up as scheduled with Dr. Rogue Bussing on 05/21/17.    Visit Diagnosis 1. Fever of unknown origin   2. Upper respiratory tract infection, unspecified type     Patient expressed understanding and was in agreement with this plan. She also understands that She can call clinic at any time with any questions, concerns, or complaints.   A total of (30) minutes of face-to-face time was spent with this patient with greater than 50% of that time in counseling and care-coordination.   Beckey Rutter, DNP, AGNP-C [place] at [facility] [id number] [phone] (office) 05/12/17 4:33 PM

## 2017-05-12 NOTE — Progress Notes (Signed)
1430 - Patient administered 1,000 mg of Tylenol po. Patient vomited shortly thereafter. NP made aware.

## 2017-05-12 NOTE — Telephone Encounter (Signed)
Patient called to report that the day after she completed her antibiotics, she started getting sick again with cold like symptoms. Asking what she is to do. Temp last night 100.8, this morning it is 99.9. Please advise

## 2017-05-12 NOTE — Telephone Encounter (Signed)
Per MD- please have patient see symptom mgmt np

## 2017-05-13 LAB — RESPIRATORY PANEL BY PCR
Adenovirus: NOT DETECTED
BORDETELLA PERTUSSIS-RVPCR: NOT DETECTED
CHLAMYDOPHILA PNEUMONIAE-RVPPCR: NOT DETECTED
CORONAVIRUS 229E-RVPPCR: NOT DETECTED
CORONAVIRUS HKU1-RVPPCR: NOT DETECTED
Coronavirus NL63: NOT DETECTED
Coronavirus OC43: NOT DETECTED
INFLUENZA B-RVPPCR: NOT DETECTED
Influenza A: NOT DETECTED
METAPNEUMOVIRUS-RVPPCR: NOT DETECTED
Mycoplasma pneumoniae: NOT DETECTED
Parainfluenza Virus 1: NOT DETECTED
Parainfluenza Virus 2: NOT DETECTED
Parainfluenza Virus 3: NOT DETECTED
Parainfluenza Virus 4: NOT DETECTED
RESPIRATORY SYNCYTIAL VIRUS-RVPPCR: NOT DETECTED
RHINOVIRUS / ENTEROVIRUS - RVPPCR: NOT DETECTED

## 2017-05-16 ENCOUNTER — Encounter

## 2017-05-16 ENCOUNTER — Other Ambulatory Visit

## 2017-05-17 LAB — CULTURE, BLOOD (ROUTINE X 2)
CULTURE: NO GROWTH
CULTURE: NO GROWTH
SPECIAL REQUESTS: ADEQUATE
Special Requests: ADEQUATE

## 2017-05-19 ENCOUNTER — Other Ambulatory Visit: Payer: Self-pay | Admitting: Surgery

## 2017-05-20 ENCOUNTER — Ambulatory Visit
Admission: RE | Admit: 2017-05-20 | Discharge: 2017-05-20 | Disposition: A | Discharge status: Home / Self Care | Source: Ambulatory Visit | Attending: Internal Medicine | Admitting: Internal Medicine

## 2017-05-20 ENCOUNTER — Encounter (HOSPITAL_COMMUNITY): Payer: Self-pay

## 2017-05-20 DIAGNOSIS — Z171 Estrogen receptor negative status [ER-]: Secondary | ICD-10-CM | POA: Diagnosis not present

## 2017-05-20 DIAGNOSIS — C50211 Malignant neoplasm of upper-inner quadrant of right female breast: Secondary | ICD-10-CM | POA: Insufficient documentation

## 2017-05-20 HISTORY — DX: Personal history of antineoplastic chemotherapy: Z92.21

## 2017-05-21 ENCOUNTER — Inpatient Hospital Stay

## 2017-05-21 ENCOUNTER — Encounter: Payer: Self-pay | Admitting: Internal Medicine

## 2017-05-21 ENCOUNTER — Inpatient Hospital Stay (HOSPITAL_BASED_OUTPATIENT_CLINIC_OR_DEPARTMENT_OTHER): Admitting: Internal Medicine

## 2017-05-21 VITALS — BP 109/74 | HR 77 | Temp 97.4°F | Resp 16 | Wt 141.8 lb

## 2017-05-21 DIAGNOSIS — C50211 Malignant neoplasm of upper-inner quadrant of right female breast: Secondary | ICD-10-CM

## 2017-05-21 DIAGNOSIS — Z171 Estrogen receptor negative status [ER-]: Principal | ICD-10-CM

## 2017-05-21 DIAGNOSIS — D649 Anemia, unspecified: Secondary | ICD-10-CM | POA: Diagnosis not present

## 2017-05-21 DIAGNOSIS — Z5111 Encounter for antineoplastic chemotherapy: Secondary | ICD-10-CM | POA: Diagnosis not present

## 2017-05-21 LAB — COMPREHENSIVE METABOLIC PANEL
ALT: 12 U/L — ABNORMAL LOW (ref 14–54)
ANION GAP: 6 (ref 5–15)
AST: 21 U/L (ref 15–41)
Albumin: 4 g/dL (ref 3.5–5.0)
Alkaline Phosphatase: 70 U/L (ref 38–126)
BILIRUBIN TOTAL: 0.4 mg/dL (ref 0.3–1.2)
BUN: 15 mg/dL (ref 6–20)
CHLORIDE: 102 mmol/L (ref 101–111)
CO2: 25 mmol/L (ref 22–32)
Calcium: 8.9 mg/dL (ref 8.9–10.3)
Creatinine, Ser: 0.68 mg/dL (ref 0.44–1.00)
Glucose, Bld: 107 mg/dL — ABNORMAL HIGH (ref 65–99)
POTASSIUM: 4.2 mmol/L (ref 3.5–5.1)
Sodium: 133 mmol/L — ABNORMAL LOW (ref 135–145)
TOTAL PROTEIN: 7.2 g/dL (ref 6.5–8.1)

## 2017-05-21 LAB — CBC WITH DIFFERENTIAL/PLATELET
BASOS ABS: 0 10*3/uL (ref 0–0.1)
Basophils Relative: 0 %
Eosinophils Absolute: 0.1 10*3/uL (ref 0–0.7)
Eosinophils Relative: 1 %
HEMATOCRIT: 29.3 % — AB (ref 35.0–47.0)
Hemoglobin: 10.1 g/dL — ABNORMAL LOW (ref 12.0–16.0)
LYMPHS ABS: 1.3 10*3/uL (ref 1.0–3.6)
LYMPHS PCT: 17 %
MCH: 32.3 pg (ref 26.0–34.0)
MCHC: 34.4 g/dL (ref 32.0–36.0)
MCV: 93.7 fL (ref 80.0–100.0)
Monocytes Absolute: 0.8 10*3/uL (ref 0.2–0.9)
Monocytes Relative: 10 %
NEUTROS ABS: 5.6 10*3/uL (ref 1.4–6.5)
NEUTROS PCT: 72 %
PLATELETS: 375 10*3/uL (ref 150–440)
RBC: 3.13 MIL/uL — AB (ref 3.80–5.20)
RDW: 16.9 % — ABNORMAL HIGH (ref 11.5–14.5)
WBC: 7.8 10*3/uL (ref 3.6–11.0)

## 2017-05-21 MED ORDER — CARBOPLATIN CHEMO INJECTION 600 MG/60ML
492.0000 mg | Freq: Once | INTRAVENOUS | Status: AC
  Administered 2017-05-21: 490 mg via INTRAVENOUS
  Filled 2017-05-21: qty 49

## 2017-05-21 MED ORDER — SODIUM CHLORIDE 0.9 % IV SOLN
Freq: Once | INTRAVENOUS | Status: AC
  Administered 2017-05-21: 11:00:00 via INTRAVENOUS
  Filled 2017-05-21: qty 1000

## 2017-05-21 MED ORDER — FAMOTIDINE IN NACL 20-0.9 MG/50ML-% IV SOLN
20.0000 mg | Freq: Once | INTRAVENOUS | Status: AC
  Administered 2017-05-21: 20 mg via INTRAVENOUS
  Filled 2017-05-21: qty 50

## 2017-05-21 MED ORDER — HEPARIN SOD (PORK) LOCK FLUSH 100 UNIT/ML IV SOLN
500.0000 [IU] | Freq: Once | INTRAVENOUS | Status: AC | PRN
  Administered 2017-05-21: 500 [IU]

## 2017-05-21 MED ORDER — HEPARIN SOD (PORK) LOCK FLUSH 100 UNIT/ML IV SOLN
INTRAVENOUS | Status: AC
  Filled 2017-05-21: qty 5

## 2017-05-21 MED ORDER — SODIUM CHLORIDE 0.9 % IV SOLN
80.0000 mg/m2 | Freq: Once | INTRAVENOUS | Status: AC
  Administered 2017-05-21: 138 mg via INTRAVENOUS
  Filled 2017-05-21: qty 23

## 2017-05-21 MED ORDER — DIPHENHYDRAMINE HCL 50 MG/ML IJ SOLN
50.0000 mg | Freq: Once | INTRAMUSCULAR | Status: AC
  Administered 2017-05-21: 50 mg via INTRAVENOUS
  Filled 2017-05-21: qty 1

## 2017-05-21 MED ORDER — SODIUM CHLORIDE 0.9 % IV SOLN
20.0000 mg | Freq: Once | INTRAVENOUS | Status: AC
  Administered 2017-05-21: 20 mg via INTRAVENOUS
  Filled 2017-05-21: qty 2

## 2017-05-21 MED ORDER — PALONOSETRON HCL INJECTION 0.25 MG/5ML
0.2500 mg | Freq: Once | INTRAVENOUS | Status: AC
  Administered 2017-05-21: 0.25 mg via INTRAVENOUS
  Filled 2017-05-21: qty 5

## 2017-05-21 NOTE — Assessment & Plan Note (Addendum)
#  Stage I ER/PR/HER-2/neu negative right breast cancer-currently on neoadjuvant chemotherapy- ddAC- s/p 4 cycles; ammo/US- improved ~80m nodule.  # Proceed with cGeronimo Boot4] q3w; taxol weekly; fatigue- CBC CMP are reviewed; adequate today; no contraindications to chemotherapy. Proceed with treatment today- except anemia.   # s/p appt- Dr.Blackman;[facility] [place]-   #Patient will need also need bilateral salpingo-oophorectomy down the line.  # follow up 3 weeks-carbo-taxol; weekly taxol x2.

## 2017-05-21 NOTE — Progress Notes (Signed)
[facility] CONSULT NOTE  Patient Care Team: Patient, No Pcp Per as PCP - General (General Practice) Magrinat, Virgie Dad, MD as Consulting Physician (Oncology) Armandina Stammer, MD (Obstetrics and Gynecology) Dillingham, Loel Lofty, DO as Attending Physician (Plastic Surgery) Coralie Keens, MD as Consulting Physician (General Surgery) Cammie Sickle, MD as Consulting Physician (Internal Medicine)  CHIEF COMPLAINTS/PURPOSE OF CONSULTATION:  Breast cancer  #  Oncology History   # OCT 2018- RIGHT BREAST UIQ -[place]; TRIPLE NEGATIVE; ki-67-high [39m-1'O clock-Bx- proven; [place]]; another- 12'O clock-432m Not biopsied. [s/p Dr.Magrinaut; [place]]; cT1c(m) cN0;  G-3; ki-67- 80%  # 11/218- ddACesolved;  x4 cyles- US/ammo- improved 32m23mothe leison-r-Taxol+carbo  # BRCA-1 Gene mutated [mom-ovarian cancer-Stage IIIB/Brca; older sister- breast ca/brca-mutated]  # MUGA scan [nov 2018]- 67%; UPBEAT clinical trial     Malignant neoplasm of upper-inner quadrant of right breast in female, estrogen receptor negative ([facility]  HISTORY OF PRESENTING ILLNESS:  [name] 67o.  female with a history of BRCA1 mutation; triple negative stage I breast cancer-currently on neoadjuvant chemotherapy status post dose dense-Adriamycin and Cytoxan status post cycle #4 is here for follow-up.  The interim patient had episode of bronchitis status post antibiotics resolved.  Patient denies any unusual shortness of breath or cough.  No nausea no vomiting no headaches.  ROS: A complete 10 point review of system is done which is negative except mentioned above in history of present illness  MEDICAL HISTORY:  Past Medical History:  Diagnosis Date  . Asthma    Excercise induced  . Fibromyalgia   . Malignant neoplasm of upper-inner quadrant of right breast in female, estrogen receptor negative ([id number]   right breast  . Personal history of chemotherapy    Right breast- 4 treatments     SURGICAL HISTORY: Past Surgical History:  Procedure Laterality Date  . IR FLUORO GUIDE PORT INSERTION LEFT  03/17/2017  . KNEE SURGERY Left    Torn meniscus   . 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 Marital status: Married    Spouse name: Not on file  . Number of children: Not on file  . Years of education: Not on file  . Highest education level: Not on file  Social Needs  . Financial resource strain: Not on file  . Food insecurity - worry: Not on file  . Food insecurity - inability: Not on file  . Transportation needs - medical: Not on file  . Transportation needs - non-medical: Not on file  Occupational History  . Not on file  Tobacco Use  . Smoking status: Former Smoker    Types: Cigarettes    Last attempt to quit: 09/16/2011    Years since quitting: 5.6  . Smokeless tobacco: Never Used  Substance and Sexual Activity  . Alcohol use: No  . Drug use: No  . Sexual activity: Yes  Other Topics Concern  . Not on file  Social History Narrative  . Not on file    FAMILY HISTORY: Family History  Problem Relation Age of Onset  . Cancer Mother        Ovarian cancer (2003), breast cancer(2017) & squamous cell   . Leukemia Mother   . Breast cancer Mother 63 60 Cancer Sister        breast cancer  . Breast cancer Sister 35 35 Melanoma Father   .  Lung cancer Paternal Grandmother     ALLERGIES:  has No Known Allergies.  MEDICATIONS:  Current Outpatient Medications  Medication Sig Dispense Refill  . DM-Phenylephrine-Acetaminophen (ROBITUSSIN COLD+FLU DAYTIME PO) Take 15 mLs by mouth every 6 (six) hours.    Marland Kitchen aspirin-acetaminophen-caffeine (EXCEDRIN MIGRAINE) 250-250-65 MG per tablet Take 1 tablet by mouth as needed for headache.    . benzonatate (TESSALON PERLES) 100 MG capsule Take 2 capsules (200 mg total) by mouth 3 (three) times daily as needed for cough.  (Patient not taking: Reported on 05/21/2017) 30 capsule 0  . cromolyn (NASALCROM) 5.2 MG/ACT nasal spray Place 1 spray into both nostrils 4 (four) times daily as needed for allergies or rhinitis. 26 mL 0  . HYDROcodone-homatropine (HYCODAN) 5-1.5 MG/5ML syrup Take 5 mLs by mouth every 6 (six) hours as needed for cough. (Patient not taking: Reported on 05/21/2017) 120 mL 0  . lidocaine-prilocaine (EMLA) cream Apply 1 application as needed topically. Apply generously over the Mediport 45 minutes prior to chemotherapy. 30 g 0  . loratadine (CLARITIN) 10 MG tablet Take 10 mg daily by mouth.    . ondansetron (ZOFRAN) 8 MG tablet One pill every 8 hours as needed for nausea/vomitting. (Patient not taking: Reported on 05/12/2017) 40 tablet 1  . prochlorperazine (COMPAZINE) 10 MG tablet Take 1 tablet (10 mg total) every 6 (six) hours as needed by mouth for nausea or vomiting. (Patient not taking: Reported on 05/12/2017) 40 tablet 1  . Pseudoephedrine-Naproxen Na (ALEVE-D SINUS & HEADACHE PO) Take 1 each by mouth as needed.    . sertraline (ZOLOFT) 100 MG tablet Take 100 mg by mouth.     No current facility-administered medications for this visit.       Marland Kitchen  PHYSICAL EXAMINATION: ECOG PERFORMANCE STATUS: 0 - Asymptomatic  Vitals:   05/21/17 1042  BP: 109/74  Pulse: 77  Resp: 16  Temp: (!) 97.4 F (36.3 C)   Filed Weights   05/21/17 1042  Weight: 141 lb 12.8 oz (64.3 kg)    GENERAL: Well-nourished well-developed; Alert, no distress and comfortable.   Accompanied by her husband. EYES: no pallor or icterus OROPHARYNX: no thrush or ulceration; good dentition  NECK: supple, no masses felt LYMPH:  no palpable lymphadenopathy in the cervical, axillary or inguinal regions LUNGS: clear to auscultation and  No wheeze or crackles HEART/CVS: regular rate & rhythm and no murmurs; No lower extremity edema ABDOMEN: abdomen soft, non-tender and normal bowel sounds Musculoskeletal:no cyanosis of digits and no  clubbing  PSYCH: alert & oriented x 3 with fluent speech NEURO: no focal motor/sensory deficits SKIN:  no rashes or significant lesions;  LABORATORY DATA:  I have reviewed the data as listed Lab Results  Component Value Date   WBC 7.8 05/21/2017   HGB 10.1 (L) 05/21/2017   HCT 29.3 (L) 05/21/2017   MCV 93.7 05/21/2017   PLT 375 05/21/2017   Recent Labs    05/01/17 0856 05/12/17 1335 05/21/17 1015  NA 135 136 133*  K 4.2 4.0 4.2  CL 102 99* 102  CO2 _0 GLUCOSE 106* 112* 107*  BUN _1 CREATININE 0.62 0.79 0.68  CALCIUM 9.0 9.4 8.9  GFRNONAA >60 >60 >60  GFRAA >60 >60 >60  PROT 7.1 7.8 7.2  ALBUMIN 4.0 4.2 4.0  AST _2 ALT 10* 12* 12*  ALKPHOS 96 118 70  BILITOT 0.4 0.4 0.4    RADIOGRAPHIC STUDIES: I have personally reviewed the  radiological images as listed and agreed with the findings in the report. Dg Chest 2 View  Result Date: 05/12/2017 CLINICAL DATA:  Cough for 4 days, fever for 2 days, history breast cancer, most recent chemotherapy 2 weeks ago EXAM: CHEST  2 VIEW COMPARISON:  04/23/2017 FINDINGS: LEFT jugular Port-A-Cath with tip projecting over cavoatrial junction. Normal heart size, mediastinal contours, and pulmonary vascularity. Lungs hyperinflated but clear. No pleural effusion or pneumothorax. Bones unremarkable. IMPRESSION: No acute abnormalities. Electronically Signed   By: Lavonia Dana M.D.   On: 05/12/2017 16:32   US Breast Ltd Uni Right Inc Axilla  Result Date: 05/20/2017 CLINICAL DATA:  Post neoadjuvant chemotherapy for known right breast cancer, diagnosed in November 2018. The patient has family history of breast cancer in mother and sister, and is BRCA 1 gene mutation positive. EXAM: 2D DIGITAL DIAGNOSTIC RIGHT MAMMOGRAM WITH CAD AND ADJUNCT TOMO ULTRASOUND RIGHT BREAST COMPARISON:  Previous exam(s). ACR Breast Density Category c: The breast tissue is heterogeneously dense, which may obscure small masses. FINDINGS: The known malignancy  in the right 1 o'clock breast, anterior to middle depth, has decreased in size mammographically and measures 7 mm. The pleomorphic calcifications are seen associated with this mass with a total extent of 14 mm. The second right subareolar 6 mm nodule seen on the most recent diagnostic mammogram is not identified on today's exam. Mammographic images were processed with CAD. On physical exam, no suspicious masses are palpated. Targeted ultrasound is performed, showing right breast 1 o'clock 2 cm from the nipple mass, which demonstrates decreased size and conspicuity. It measures 0.9 x 0.6 x 0.4 mm. There are associated microcalcifications, and post biopsy marker. No sonographic abnormality seen at 12 o'clock subareolar breast. No evidence of right axillary lymphadenopathy. IMPRESSION: Interval decrease in the size of known right breast 1 o'clock malignancy, which now measures 0.9 x 0.6 x 0.4 cm. There are associated pleomorphic calcifications which span 1.4 cm. RECOMMENDATION: Continue with plan of care for known right breast cancer. I have discussed the findings and recommendations with the patient. Results were also provided in writing at the conclusion of the visit. If applicable, a reminder letter will be sent to the patient regarding the next appointment. BI-RADS CATEGORY  6: Known biopsy-proven malignancy. Electronically Signed   By: Fidela Salisbury M.D.   On: 05/20/2017 14:39   Mm Diag Breast Tomo Uni Right  Result Date: 05/20/2017 CLINICAL DATA:  Post neoadjuvant chemotherapy for known right breast cancer, diagnosed in November 2018. The patient has family history of breast cancer in mother and sister, and is BRCA 1 gene mutation positive. EXAM: 2D DIGITAL DIAGNOSTIC RIGHT MAMMOGRAM WITH CAD AND ADJUNCT TOMO ULTRASOUND RIGHT BREAST COMPARISON:  Previous exam(s). ACR Breast Density Category c: The breast tissue is heterogeneously dense, which may obscure small masses. FINDINGS: The known malignancy in  the right 1 o'clock breast, anterior to middle depth, has decreased in size mammographically and measures 7 mm. The pleomorphic calcifications are seen associated with this mass with a total extent of 14 mm. The second right subareolar 6 mm nodule seen on the most recent diagnostic mammogram is not identified on today's exam. Mammographic images were processed with CAD. On physical exam, no suspicious masses are palpated. Targeted ultrasound is performed, showing right breast 1 o'clock 2 cm from the nipple mass, which demonstrates decreased size and conspicuity. It measures 0.9 x 0.6 x 0.4 mm. There are associated microcalcifications, and post biopsy marker. No sonographic abnormality seen at 12 o'clock  subareolar breast. No evidence of right axillary lymphadenopathy. IMPRESSION: Interval decrease in the size of known right breast 1 o'clock malignancy, which now measures 0.9 x 0.6 x 0.4 cm. There are associated pleomorphic calcifications which span 1.4 cm. RECOMMENDATION: Continue with plan of care for known right breast cancer. I have discussed the findings and recommendations with the patient. Results were also provided in writing at the conclusion of the visit. If applicable, a reminder letter will be sent to the patient regarding the next appointment. BI-RADS CATEGORY  6: Known biopsy-proven malignancy. Electronically Signed   By: Fidela Salisbury M.D.   On: 05/20/2017 14:39   Mm Outside Films Mammo  Result Date: 05/08/2017 This examination belongs to an outside facility and is stored here for comparison purposes only.  Contact the originating outside institution for any associated report or interpretation.   ASSESSMENT & PLAN:   Malignant neoplasm of upper-inner quadrant of right breast in female, estrogen receptor negative ([place]) # Stage I ER/PR/HER-2/neu negative right breast cancer-currently on neoadjuvant chemotherapy- ddAC- s/p 4 cycles; ammo/US- improved ~104m nodule.  # Proceed with cGeronimo Boot4] q3w; taxol weekly; fatigue- CBC CMP are reviewed; adequate today; no contraindications to chemotherapy. Proceed with treatment today- except anemia.   # s/p appt- Dr.Blackman;[facility]-   #Patient will need also need bilateral salpingo-oophorectomy down the line.  # follow up 3 weeks-carbo-taxol; weekly taxol x2..   All questions were answered. The patient knows to call the clinic with any problems, questions or concerns.    GCammie Sickle MD 05/27/2017 6:53 PM

## 2017-05-22 ENCOUNTER — Ambulatory Visit: Admitting: Internal Medicine

## 2017-05-22 ENCOUNTER — Ambulatory Visit

## 2017-05-22 ENCOUNTER — Other Ambulatory Visit

## 2017-05-28 ENCOUNTER — Inpatient Hospital Stay

## 2017-05-28 ENCOUNTER — Other Ambulatory Visit: Payer: Self-pay | Admitting: Internal Medicine

## 2017-05-28 VITALS — BP 112/74 | HR 86 | Temp 97.8°F | Resp 16

## 2017-05-28 DIAGNOSIS — Z171 Estrogen receptor negative status [ER-]: Principal | ICD-10-CM

## 2017-05-28 DIAGNOSIS — Z006 Encounter for examination for normal comparison and control in clinical research program: Secondary | ICD-10-CM

## 2017-05-28 DIAGNOSIS — C50211 Malignant neoplasm of upper-inner quadrant of right female breast: Secondary | ICD-10-CM

## 2017-05-28 DIAGNOSIS — Z5111 Encounter for antineoplastic chemotherapy: Secondary | ICD-10-CM | POA: Diagnosis not present

## 2017-05-28 LAB — COMPREHENSIVE METABOLIC PANEL
ALT: 29 U/L (ref 14–54)
AST: 40 U/L (ref 15–41)
Albumin: 4.1 g/dL (ref 3.5–5.0)
Alkaline Phosphatase: 55 U/L (ref 38–126)
Anion gap: 10 (ref 5–15)
BILIRUBIN TOTAL: 0.6 mg/dL (ref 0.3–1.2)
BUN: 18 mg/dL (ref 6–20)
CALCIUM: 9.1 mg/dL (ref 8.9–10.3)
CO2: 23 mmol/L (ref 22–32)
CREATININE: 0.54 mg/dL (ref 0.44–1.00)
Chloride: 102 mmol/L (ref 101–111)
GFR calc Af Amer: >60 mL/min (ref >60)
GFR calc non Af Amer: >60 mL/min (ref >60)
Glucose, Bld: 101 mg/dL — ABNORMAL HIGH (ref 65–99)
Potassium: 3.9 mmol/L (ref 3.5–5.1)
Sodium: 135 mmol/L (ref 135–145)
TOTAL PROTEIN: 7.3 g/dL (ref 6.5–8.1)

## 2017-05-28 LAB — CBC WITH DIFFERENTIAL/PLATELET
BASOS ABS: 0.1 10*3/uL (ref 0–0.1)
Basophils Relative: 3 %
Eosinophils Absolute: 0.1 10*3/uL (ref 0–0.7)
Eosinophils Relative: 2 %
HEMATOCRIT: 26.9 % — AB (ref 35.0–47.0)
Hemoglobin: 9.3 g/dL — ABNORMAL LOW (ref 12.0–16.0)
LYMPHS ABS: 0.7 10*3/uL — AB (ref 1.0–3.6)
Lymphocytes Relative: 21 %
MCH: 32.5 pg (ref 26.0–34.0)
MCHC: 34.5 g/dL (ref 32.0–36.0)
MCV: 94.1 fL (ref 80.0–100.0)
MONO ABS: 0.3 10*3/uL (ref 0.2–0.9)
Monocytes Relative: 9 %
NEUTROS ABS: 2.1 10*3/uL (ref 1.4–6.5)
Neutrophils Relative %: 65 %
Platelets: 321 10*3/uL (ref 150–440)
RBC: 2.86 MIL/uL — AB (ref 3.80–5.20)
RDW: 16.6 % — ABNORMAL HIGH (ref 11.5–14.5)
WBC: 3.3 10*3/uL — AB (ref 3.6–11.0)

## 2017-05-28 MED ORDER — FAMOTIDINE IN NACL 20-0.9 MG/50ML-% IV SOLN
20.0000 mg | Freq: Once | INTRAVENOUS | Status: AC
  Administered 2017-05-28: 20 mg via INTRAVENOUS
  Filled 2017-05-28: qty 50

## 2017-05-28 MED ORDER — DEXAMETHASONE SODIUM PHOSPHATE 100 MG/10ML IJ SOLN
20.0000 mg | Freq: Once | INTRAMUSCULAR | Status: AC
  Administered 2017-05-28: 20 mg via INTRAVENOUS
  Filled 2017-05-28: qty 2

## 2017-05-28 MED ORDER — DIPHENHYDRAMINE HCL 50 MG/ML IJ SOLN
50.0000 mg | Freq: Once | INTRAMUSCULAR | Status: AC
  Administered 2017-05-28: 50 mg via INTRAVENOUS
  Filled 2017-05-28: qty 1

## 2017-05-28 MED ORDER — HEPARIN SOD (PORK) LOCK FLUSH 100 UNIT/ML IV SOLN
500.0000 [IU] | Freq: Once | INTRAVENOUS | Status: AC
  Administered 2017-05-28: 500 [IU] via INTRAVENOUS

## 2017-05-28 MED ORDER — PACLITAXEL CHEMO INJECTION 300 MG/50ML
80.0000 mg/m2 | Freq: Once | INTRAVENOUS | Status: AC
  Administered 2017-05-28: 138 mg via INTRAVENOUS
  Filled 2017-05-28: qty 23

## 2017-05-28 MED ORDER — SODIUM CHLORIDE 0.9 % IV SOLN
Freq: Once | INTRAVENOUS | Status: AC
  Administered 2017-05-28: 10:00:00 via INTRAVENOUS
  Filled 2017-05-28: qty 1000

## 2017-05-29 ENCOUNTER — Other Ambulatory Visit

## 2017-05-29 ENCOUNTER — Ambulatory Visit

## 2017-06-04 ENCOUNTER — Inpatient Hospital Stay: Attending: Internal Medicine

## 2017-06-04 ENCOUNTER — Other Ambulatory Visit: Payer: Self-pay | Admitting: Internal Medicine

## 2017-06-04 ENCOUNTER — Inpatient Hospital Stay

## 2017-06-04 VITALS — BP 102/65 | HR 76 | Temp 98.2°F | Resp 18 | Wt 141.4 lb

## 2017-06-04 DIAGNOSIS — C50211 Malignant neoplasm of upper-inner quadrant of right female breast: Secondary | ICD-10-CM

## 2017-06-04 DIAGNOSIS — D649 Anemia, unspecified: Secondary | ICD-10-CM | POA: Diagnosis not present

## 2017-06-04 DIAGNOSIS — Z171 Estrogen receptor negative status [ER-]: Secondary | ICD-10-CM

## 2017-06-04 DIAGNOSIS — Z5111 Encounter for antineoplastic chemotherapy: Secondary | ICD-10-CM | POA: Diagnosis present

## 2017-06-04 DIAGNOSIS — Z170 Estrogen receptor positive status [ER+]: Secondary | ICD-10-CM | POA: Insufficient documentation

## 2017-06-04 DIAGNOSIS — Z006 Encounter for examination for normal comparison and control in clinical research program: Secondary | ICD-10-CM | POA: Diagnosis present

## 2017-06-04 LAB — COMPREHENSIVE METABOLIC PANEL
ALBUMIN: 4 g/dL (ref 3.5–5.0)
ALK PHOS: 54 U/L (ref 38–126)
ALT: 37 U/L (ref 14–54)
AST: 36 U/L (ref 15–41)
Anion gap: 8 (ref 5–15)
BILIRUBIN TOTAL: 0.3 mg/dL (ref 0.3–1.2)
BUN: 16 mg/dL (ref 6–20)
CALCIUM: 9.1 mg/dL (ref 8.9–10.3)
CO2: 25 mmol/L (ref 22–32)
CREATININE: 0.56 mg/dL (ref 0.44–1.00)
Chloride: 103 mmol/L (ref 101–111)
GFR calc Af Amer: >60 mL/min (ref >60)
GFR calc non Af Amer: >60 mL/min (ref >60)
GLUCOSE: 110 mg/dL — AB (ref 65–99)
POTASSIUM: 4 mmol/L (ref 3.5–5.1)
Sodium: 136 mmol/L (ref 135–145)
TOTAL PROTEIN: 7.1 g/dL (ref 6.5–8.1)

## 2017-06-04 LAB — CBC WITH DIFFERENTIAL/PLATELET
BASOS ABS: 0 10*3/uL (ref 0–0.1)
BASOS PCT: 1 %
Eosinophils Absolute: 0.1 10*3/uL (ref 0–0.7)
Eosinophils Relative: 4 %
HEMATOCRIT: 26.4 % — AB (ref 35.0–47.0)
HEMOGLOBIN: 9.1 g/dL — AB (ref 12.0–16.0)
LYMPHS PCT: 32 %
Lymphs Abs: 0.9 10*3/uL — ABNORMAL LOW (ref 1.0–3.6)
MCH: 33.4 pg (ref 26.0–34.0)
MCHC: 34.5 g/dL (ref 32.0–36.0)
MCV: 96.7 fL (ref 80.0–100.0)
Monocytes Absolute: 0.4 10*3/uL (ref 0.2–0.9)
Monocytes Relative: 15 %
NEUTROS ABS: 1.4 10*3/uL (ref 1.4–6.5)
NEUTROS PCT: 48 %
Platelets: 183 10*3/uL (ref 150–440)
RBC: 2.73 MIL/uL — AB (ref 3.80–5.20)
RDW: 17.8 % — ABNORMAL HIGH (ref 11.5–14.5)
WBC: 3 10*3/uL — ABNORMAL LOW (ref 3.6–11.0)

## 2017-06-04 MED ORDER — SODIUM CHLORIDE 0.9 % IV SOLN
20.0000 mg | Freq: Once | INTRAVENOUS | Status: AC
  Administered 2017-06-04: 20 mg via INTRAVENOUS
  Filled 2017-06-04: qty 2

## 2017-06-04 MED ORDER — FAMOTIDINE IN NACL 20-0.9 MG/50ML-% IV SOLN
20.0000 mg | Freq: Once | INTRAVENOUS | Status: AC
  Administered 2017-06-04: 20 mg via INTRAVENOUS
  Filled 2017-06-04: qty 50

## 2017-06-04 MED ORDER — DIPHENHYDRAMINE HCL 50 MG/ML IJ SOLN
50.0000 mg | Freq: Once | INTRAMUSCULAR | Status: AC
  Administered 2017-06-04: 50 mg via INTRAVENOUS
  Filled 2017-06-04: qty 1

## 2017-06-04 MED ORDER — SODIUM CHLORIDE 0.9 % IV SOLN
80.0000 mg/m2 | Freq: Once | INTRAVENOUS | Status: AC
  Administered 2017-06-04: 138 mg via INTRAVENOUS
  Filled 2017-06-04: qty 23

## 2017-06-04 MED ORDER — HEPARIN SOD (PORK) LOCK FLUSH 100 UNIT/ML IV SOLN
500.0000 [IU] | Freq: Once | INTRAVENOUS | Status: AC | PRN
  Administered 2017-06-04: 500 [IU]
  Filled 2017-06-04: qty 5

## 2017-06-04 MED ORDER — SODIUM CHLORIDE 0.9 % IV SOLN
Freq: Once | INTRAVENOUS | Status: AC
  Administered 2017-06-04: 11:00:00 via INTRAVENOUS
  Filled 2017-06-04: qty 1000

## 2017-06-04 MED ORDER — SODIUM CHLORIDE 0.9% FLUSH
10.0000 mL | INTRAVENOUS | Status: DC | PRN
  Administered 2017-06-04: 10 mL
  Filled 2017-06-04: qty 10

## 2017-06-04 NOTE — Progress Notes (Signed)
Okay to proceed with treatment with ANC- 1.4.

## 2017-06-04 NOTE — Progress Notes (Signed)
ANC: 1400. MD, Dr. Rogue Bussing, notified. Per MD order: proceed with scheduled Taxol treatment today.

## 2017-06-11 ENCOUNTER — Inpatient Hospital Stay (HOSPITAL_BASED_OUTPATIENT_CLINIC_OR_DEPARTMENT_OTHER): Admitting: Internal Medicine

## 2017-06-11 ENCOUNTER — Inpatient Hospital Stay

## 2017-06-11 ENCOUNTER — Encounter: Payer: Self-pay | Admitting: Internal Medicine

## 2017-06-11 VITALS — BP 107/68 | HR 78 | Temp 97.9°F | Resp 16 | Wt 140.0 lb

## 2017-06-11 DIAGNOSIS — Z5111 Encounter for antineoplastic chemotherapy: Secondary | ICD-10-CM | POA: Diagnosis not present

## 2017-06-11 DIAGNOSIS — Z171 Estrogen receptor negative status [ER-]: Secondary | ICD-10-CM | POA: Diagnosis not present

## 2017-06-11 DIAGNOSIS — C50211 Malignant neoplasm of upper-inner quadrant of right female breast: Secondary | ICD-10-CM

## 2017-06-11 DIAGNOSIS — D649 Anemia, unspecified: Secondary | ICD-10-CM

## 2017-06-11 LAB — COMPREHENSIVE METABOLIC PANEL
ALT: 36 U/L (ref 14–54)
AST: 31 U/L (ref 15–41)
Albumin: 4.2 g/dL (ref 3.5–5.0)
Alkaline Phosphatase: 62 U/L (ref 38–126)
Anion gap: 6 (ref 5–15)
BILIRUBIN TOTAL: 0.6 mg/dL (ref 0.3–1.2)
BUN: 12 mg/dL (ref 6–20)
CO2: 27 mmol/L (ref 22–32)
CREATININE: 0.63 mg/dL (ref 0.44–1.00)
Calcium: 9.5 mg/dL (ref 8.9–10.3)
Chloride: 104 mmol/L (ref 101–111)
GFR calc Af Amer: >60 mL/min (ref >60)
Glucose, Bld: 93 mg/dL (ref 65–99)
Potassium: 4.2 mmol/L (ref 3.5–5.1)
Sodium: 137 mmol/L (ref 135–145)
TOTAL PROTEIN: 7.2 g/dL (ref 6.5–8.1)

## 2017-06-11 LAB — CBC WITH DIFFERENTIAL/PLATELET
Basophils Absolute: 0 10*3/uL (ref 0–0.1)
Basophils Relative: 1 %
EOS PCT: 3 %
Eosinophils Absolute: 0.1 10*3/uL (ref 0–0.7)
HCT: 29.8 % — ABNORMAL LOW (ref 35.0–47.0)
Hemoglobin: 10.3 g/dL — ABNORMAL LOW (ref 12.0–16.0)
Lymphocytes Relative: 27 %
Lymphs Abs: 0.9 10*3/uL — ABNORMAL LOW (ref 1.0–3.6)
MCH: 34.1 pg — AB (ref 26.0–34.0)
MCHC: 34.7 g/dL (ref 32.0–36.0)
MCV: 98.1 fL (ref 80.0–100.0)
Monocytes Absolute: 0.3 10*3/uL (ref 0.2–0.9)
Monocytes Relative: 11 %
Neutro Abs: 1.9 10*3/uL (ref 1.4–6.5)
Neutrophils Relative %: 58 %
PLATELETS: 206 10*3/uL (ref 150–440)
RBC: 3.03 MIL/uL — AB (ref 3.80–5.20)
RDW: 18.1 % — ABNORMAL HIGH (ref 11.5–14.5)
WBC: 3.2 10*3/uL — AB (ref 3.6–11.0)

## 2017-06-11 MED ORDER — SODIUM CHLORIDE 0.9 % IV SOLN
492.0000 mg | Freq: Once | INTRAVENOUS | Status: AC
  Administered 2017-06-11: 490 mg via INTRAVENOUS
  Filled 2017-06-11: qty 49

## 2017-06-11 MED ORDER — SODIUM CHLORIDE 0.9 % IV SOLN
Freq: Once | INTRAVENOUS | Status: AC
  Administered 2017-06-11: 10:00:00 via INTRAVENOUS
  Filled 2017-06-11: qty 1000

## 2017-06-11 MED ORDER — SODIUM CHLORIDE 0.9 % IV SOLN
80.0000 mg/m2 | Freq: Once | INTRAVENOUS | Status: AC
  Administered 2017-06-11: 138 mg via INTRAVENOUS
  Filled 2017-06-11: qty 23

## 2017-06-11 MED ORDER — HEPARIN SOD (PORK) LOCK FLUSH 100 UNIT/ML IV SOLN
500.0000 [IU] | Freq: Once | INTRAVENOUS | Status: AC
  Administered 2017-06-11: 500 [IU] via INTRAVENOUS
  Filled 2017-06-11: qty 5

## 2017-06-11 MED ORDER — SODIUM CHLORIDE 0.9% FLUSH
10.0000 mL | Freq: Once | INTRAVENOUS | Status: AC
  Administered 2017-06-11: 10 mL via INTRAVENOUS
  Filled 2017-06-11: qty 10

## 2017-06-11 MED ORDER — PALONOSETRON HCL INJECTION 0.25 MG/5ML
0.2500 mg | Freq: Once | INTRAVENOUS | Status: AC
  Administered 2017-06-11: 0.25 mg via INTRAVENOUS
  Filled 2017-06-11: qty 5

## 2017-06-11 MED ORDER — FAMOTIDINE IN NACL 20-0.9 MG/50ML-% IV SOLN
20.0000 mg | Freq: Once | INTRAVENOUS | Status: AC
  Administered 2017-06-11: 20 mg via INTRAVENOUS
  Filled 2017-06-11: qty 50

## 2017-06-11 MED ORDER — DIPHENHYDRAMINE HCL 50 MG/ML IJ SOLN
50.0000 mg | Freq: Once | INTRAMUSCULAR | Status: AC
  Administered 2017-06-11: 50 mg via INTRAVENOUS
  Filled 2017-06-11: qty 1

## 2017-06-11 MED ORDER — SODIUM CHLORIDE 0.9 % IV SOLN
20.0000 mg | Freq: Once | INTRAVENOUS | Status: AC
  Administered 2017-06-11: 20 mg via INTRAVENOUS
  Filled 2017-06-11: qty 2

## 2017-06-11 NOTE — Assessment & Plan Note (Addendum)
#  Stage I ER/PR/HER-2/neu negative right breast cancer-currently on neoadjuvant chemotherapy- ddAC- s/p 4 cycles; ammo/US- improved ~51m nodule. On [place]q3/taxol weekly s/p cycle #1.   # Proceed with cGeronimo Boot4] q3w #2; taxol weekly; fatigue- CBC CMP are reviewed; adequate today; no contraindications to chemotherapy. Proceed with treatment today- except anemia.   # s/p appt- Dr.Blackman;[facility]- reminded likely plan of surgery- end of April or may starting.   #Patient will need also need bilateral salpingo-oophorectomy down the line.  # follow up 3 weeks-/MDcarbo-taxol; weekly taxol x2.

## 2017-06-11 NOTE — Progress Notes (Signed)
[facility] NOTE  Patient Care Team: Patient, No Pcp Per as PCP - General (General Practice) Magrinat, Virgie Dad, MD as Consulting Physician (Oncology) Armandina Stammer, MD (Obstetrics and Gynecology) Dillingham, Loel Lofty, DO as Attending Physician (Plastic Surgery) Coralie Keens, MD as Consulting Physician (General Surgery) Cammie Sickle, MD as Consulting Physician (Internal Medicine)  CHIEF COMPLAINTS/PURPOSE OF CONSULTATION:  Breast cancer  #  Oncology History   # OCT 2018- RIGHT BREAST UIQ -[place]; TRIPLE NEGATIVE; ki-67-high [84m-1'O clock-Bx- proven; [place]]; another- 12'O clock-464m Not biopsied. [s/p Dr.Magrinaut; [place]]; cT1c(m) cN0;  G-3; ki-67- 80%  # 11/218- ddACesolved;  x4 cyles- US/ammo- improved 45m68mothe leison-r-Taxol+carbo  # BRCA-1 Gene mutated [mom-ovarian cancer-Stage IIIB/Brca; older sister- breast ca/brca-mutated]  # MUGA scan [nov 2018]- 67%; UPBEAT clinical trial     Malignant neoplasm of upper-inner quadrant of right breast in female, estrogen receptor negative ([place]  HISTORY OF PRESENTING ILLNESS:  Holly Parker 2o.  female with a history of BRCA1 mutation; triple negative stage I breast cancer-currently on neoadjuvant chemotherapy status -currently on carboplatin every 3 weeks/Taxol weekly is here for follow-up.  Patient denies any unusual shortness of breath or cough.  No nausea no vomiting no headaches.  Notes to have mild nausea with chemotherapy.  Otherwise not significant.  Did not have take antiemetics.  ROS: A complete 10 point review of system is done which is negative except mentioned above in history of present illness  MEDICAL HISTORY:  Past Medical History:  Diagnosis Date  . Asthma    Excercise induced  . Fibromyalgia   . Malignant neoplasm of upper-inner quadrant of right breast in female, estrogen receptor negative ([id number]   right breast  . Personal history of chemotherapy    Right  breast- 4 treatments    SURGICAL HISTORY: Past Surgical History:  Procedure Laterality Date  . IR FLUORO GUIDE PORT INSERTION LEFT  03/17/2017  . KNEE SURGERY Left    Torn meniscus   . 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 Marital status: Married    Spouse name: Not on file  . Number of children: Not on file  . Years of education: Not on file  . Highest education level: Not on file  Social Needs  . Financial resource strain: Not on file  . Food insecurity - worry: Not on file  . Food insecurity - inability: Not on file  . Transportation needs - medical: Not on file  . Transportation needs - non-medical: Not on file  Occupational History  . Not on file  Tobacco Use  . Smoking status: Former Smoker    Types: Cigarettes    Last attempt to quit: 09/16/2011    Years since quitting: 5.7  . Smokeless tobacco: Never Used  Substance and Sexual Activity  . Alcohol use: No  . Drug use: No  . Sexual activity: Yes  Other Topics Concern  . Not on file  Social History Narrative  . Not on file    FAMILY HISTORY: Family History  Problem Relation Age of Onset  . Cancer Mother        Ovarian cancer (2003), breast cancer(2017) & squamous cell   . Leukemia Mother   . Breast cancer Mother 63 81 Cancer Sister        breast cancer  . Breast cancer Sister 35 25  Melanoma Father   . Lung cancer Paternal Grandmother     ALLERGIES:  has No Known Allergies.  MEDICATIONS:  Current Outpatient Medications  Medication Sig Dispense Refill  . aspirin-acetaminophen-caffeine (EXCEDRIN MIGRAINE) 250-250-65 MG per tablet Take 1 tablet by mouth as needed for headache.    . cromolyn (NASALCROM) 5.2 MG/ACT nasal spray Place 1 spray into both nostrils 4 (four) times daily as needed for allergies or rhinitis. 26 mL 0  . lidocaine-prilocaine (EMLA) cream Apply 1 application as needed topically.  Apply generously over the Mediport 45 minutes prior to chemotherapy. 30 g 0  . loratadine (CLARITIN) 10 MG tablet Take 10 mg daily by mouth.    . ondansetron (ZOFRAN) 8 MG tablet One pill every 8 hours as needed for nausea/vomitting. 40 tablet 1  . prochlorperazine (COMPAZINE) 10 MG tablet Take 1 tablet (10 mg total) every 6 (six) hours as needed by mouth for nausea or vomiting. 40 tablet 1  . Pseudoephedrine-Naproxen Na (ALEVE-D SINUS & HEADACHE PO) Take 1 each by mouth as needed.    . sertraline (ZOLOFT) 100 MG tablet Take 100 mg by mouth.    Marland Kitchen DM-Phenylephrine-Acetaminophen (ROBITUSSIN COLD+FLU DAYTIME PO) Take 15 mLs by mouth every 6 (six) hours.     No current facility-administered medications for this visit.    Facility-Administered Medications Ordered in Other Visits  Medication Dose Route Frequency Provider Last Rate Last Dose  . CARBOplatin (PARAPLATIN) 490 mg in sodium chloride 0.9 % 250 mL chemo infusion  490 mg Intravenous Once [name] R, MD      . heparin lock flush 100 unit/mL  500 Units Intravenous Once [name] R, MD      . PACLitaxel (TAXOL) 138 mg in sodium chloride 0.9 % 250 mL chemo infusion (</= 75m/m2)  80 mg/m2 (Treatment Plan Recorded) Intravenous Once BCammie Sickle MD          .  PHYSICAL EXAMINATION: ECOG PERFORMANCE STATUS: 0 - Asymptomatic  Vitals:   06/11/17 0850  BP: 107/68  Pulse: 78  Resp: 16  Temp: 97.9 F (36.6 C)   Filed Weights   06/11/17 0850  Weight: 140 lb (63.5 kg)    GENERAL: Well-nourished well-developed; Alert, no distress and comfortable.   Accompanied by her mother.  EYES: no pallor or icterus OROPHARYNX: no thrush or ulceration; good dentition  NECK: supple, no masses felt LYMPH:  no palpable lymphadenopathy in the cervical, axillary or inguinal regions LUNGS: clear to auscultation and  No wheeze or crackles HEART/CVS: regular rate & rhythm and no murmurs; No lower extremity edema ABDOMEN:  abdomen soft, non-tender and normal bowel sounds Musculoskeletal:no cyanosis of digits and no clubbing  PSYCH: alert & oriented x 3 with fluent speech NEURO: no focal motor/sensory deficits SKIN:  no rashes or significant lesions;  LABORATORY DATA:  I have reviewed the data as listed Lab Results  Component Value Date   WBC 3.2 (L) 06/11/2017   HGB 10.3 (L) 06/11/2017   HCT 29.8 (L) 06/11/2017   MCV 98.1 06/11/2017   PLT 206 06/11/2017   Recent Labs    05/28/17 0857 06/04/17 0953 06/11/17 0827  NA 135 136 137  K 3.9 4.0 4.2  CL 102 103 104  CO2 '23 25 27  ' GLUCOSE 101* 110* 93  BUN '18 16 12  ' CREATININE 0.54 0.56 0.63  CALCIUM 9.1 9.1 9.5  GFRNONAA >60 >60 >60  GFRAA >60 >60 >60  PROT 7.3 7.1 7.2  ALBUMIN 4.1 4.0 4.2  AST 40 36 31  ALT 29 37 36  ALKPHOS 55 54 62  BILITOT 0.6 0.3 0.6    RADIOGRAPHIC STUDIES: I have personally reviewed the radiological images as listed and agreed with the findings in the report. Dg Chest 2 View  Result Date: 05/12/2017 CLINICAL DATA:  Cough for 4 days, fever for 2 days, history breast cancer, most recent chemotherapy 2 weeks ago EXAM: CHEST  2 VIEW COMPARISON:  04/23/2017 FINDINGS: LEFT jugular Port-A-Cath with tip projecting over cavoatrial junction. Normal heart size, mediastinal contours, and pulmonary vascularity. Lungs hyperinflated but clear. No pleural effusion or pneumothorax. Bones unremarkable. IMPRESSION: No acute abnormalities. Electronically Signed   By: Lavonia Dana M.D.   On: 05/12/2017 16:32   US Breast Ltd Uni Right Inc Axilla  Result Date: 05/20/2017 CLINICAL DATA:  Post neoadjuvant chemotherapy for known right breast cancer, diagnosed in November 2018. The patient has family history of breast cancer in mother and sister, and is BRCA 1 gene mutation positive. EXAM: 2D DIGITAL DIAGNOSTIC RIGHT MAMMOGRAM WITH CAD AND ADJUNCT TOMO ULTRASOUND RIGHT BREAST COMPARISON:  Previous exam(s). ACR Breast Density Category c: The breast  tissue is heterogeneously dense, which may obscure small masses. FINDINGS: The known malignancy in the right 1 o'clock breast, anterior to middle depth, has decreased in size mammographically and measures 7 mm. The pleomorphic calcifications are seen associated with this mass with a total extent of 14 mm. The second right subareolar 6 mm nodule seen on the most recent diagnostic mammogram is not identified on today's exam. Mammographic images were processed with CAD. On physical exam, no suspicious masses are palpated. Targeted ultrasound is performed, showing right breast 1 o'clock 2 cm from the nipple mass, which demonstrates decreased size and conspicuity. It measures 0.9 x 0.6 x 0.4 mm. There are associated microcalcifications, and post biopsy marker. No sonographic abnormality seen at 12 o'clock subareolar breast. No evidence of right axillary lymphadenopathy. IMPRESSION: Interval decrease in the size of known right breast 1 o'clock malignancy, which now measures 0.9 x 0.6 x 0.4 cm. There are associated pleomorphic calcifications which span 1.4 cm. RECOMMENDATION: Continue with plan of care for known right breast cancer. I have discussed the findings and recommendations with the patient. Results were also provided in writing at the conclusion of the visit. If applicable, a reminder letter will be sent to the patient regarding the next appointment. BI-RADS CATEGORY  6: Known biopsy-proven malignancy. Electronically Signed   By: Fidela Salisbury M.D.   On: 05/20/2017 14:39   Mm Diag Breast Tomo Uni Right  Result Date: 05/20/2017 CLINICAL DATA:  Post neoadjuvant chemotherapy for known right breast cancer, diagnosed in November 2018. The patient has family history of breast cancer in mother and sister, and is BRCA 1 gene mutation positive. EXAM: 2D DIGITAL DIAGNOSTIC RIGHT MAMMOGRAM WITH CAD AND ADJUNCT TOMO ULTRASOUND RIGHT BREAST COMPARISON:  Previous exam(s). ACR Breast Density Category c: The breast  tissue is heterogeneously dense, which may obscure small masses. FINDINGS: The known malignancy in the right 1 o'clock breast, anterior to middle depth, has decreased in size mammographically and measures 7 mm. The pleomorphic calcifications are seen associated with this mass with a total extent of 14 mm. The second right subareolar 6 mm nodule seen on the most recent diagnostic mammogram is not identified on today's exam. Mammographic images were processed with CAD. On physical exam, no suspicious masses are palpated. Targeted ultrasound is performed, showing right breast 1 o'clock 2 cm from the nipple mass,  which demonstrates decreased size and conspicuity. It measures 0.9 x 0.6 x 0.4 mm. There are associated microcalcifications, and post biopsy marker. No sonographic abnormality seen at 12 o'clock subareolar breast. No evidence of right axillary lymphadenopathy. IMPRESSION: Interval decrease in the size of known right breast 1 o'clock malignancy, which now measures 0.9 x 0.6 x 0.4 cm. There are associated pleomorphic calcifications which span 1.4 cm. RECOMMENDATION: Continue with plan of care for known right breast cancer. I have discussed the findings and recommendations with the patient. Results were also provided in writing at the conclusion of the visit. If applicable, a reminder letter will be sent to the patient regarding the next appointment. BI-RADS CATEGORY  6: Known biopsy-proven malignancy. Electronically Signed   By: Fidela Salisbury M.D.   On: 05/20/2017 14:39    ASSESSMENT & PLAN:   Malignant neoplasm of upper-inner quadrant of right breast in female, estrogen receptor negative ([place]) # Stage I ER/PR/HER-2/neu negative right breast cancer-currently on neoadjuvant chemotherapy- ddAC- s/p 4 cycles; ammo/US- improved ~35m nodule. On cBotswanaq3/taxol weekly s/p cycle #1.   # Proceed with cGeronimo Boot4] q3w #2; taxol weekly; fatigue- CBC CMP are reviewed; adequate today; no contraindications to  chemotherapy. Proceed with treatment today- except anemia.   # s/p appt- Dr.Blackman;[facility]- reminded likely plan of surgery- end of April or may starting.   #Patient will need also need bilateral salpingo-oophorectomy down the line.  # follow up 3 weeks-/MDcarbo-taxol; weekly taxol x2.   All questions were answered. The patient knows to call the clinic with any problems, questions or concerns.    GCammie Sickle MD 06/11/2017 10:23 AM

## 2017-06-17 ENCOUNTER — Ambulatory Visit (HOSPITAL_COMMUNITY)
Admission: RE | Admit: 2017-06-17 | Discharge: 2017-06-17 | Disposition: A | Discharge status: Home / Self Care | Source: Ambulatory Visit | Attending: Internal Medicine | Admitting: Internal Medicine

## 2017-06-17 DIAGNOSIS — Z006 Encounter for examination for normal comparison and control in clinical research program: Secondary | ICD-10-CM

## 2017-06-18 ENCOUNTER — Encounter: Payer: Self-pay | Admitting: *Deleted

## 2017-06-18 ENCOUNTER — Inpatient Hospital Stay

## 2017-06-18 VITALS — BP 114/73 | HR 76 | Temp 98.1°F | Resp 20

## 2017-06-18 DIAGNOSIS — C50211 Malignant neoplasm of upper-inner quadrant of right female breast: Secondary | ICD-10-CM

## 2017-06-18 DIAGNOSIS — Z5111 Encounter for antineoplastic chemotherapy: Secondary | ICD-10-CM | POA: Diagnosis not present

## 2017-06-18 DIAGNOSIS — Z171 Estrogen receptor negative status [ER-]: Principal | ICD-10-CM

## 2017-06-18 LAB — COMPREHENSIVE METABOLIC PANEL
ALT: 33 U/L (ref 14–54)
AST: 29 U/L (ref 15–41)
Albumin: 4.3 g/dL (ref 3.5–5.0)
Alkaline Phosphatase: 58 U/L (ref 38–126)
Anion gap: 7 (ref 5–15)
BILIRUBIN TOTAL: 0.5 mg/dL (ref 0.3–1.2)
BUN: 23 mg/dL — AB (ref 6–20)
CO2: 24 mmol/L (ref 22–32)
CREATININE: 0.52 mg/dL (ref 0.44–1.00)
Calcium: 9.1 mg/dL (ref 8.9–10.3)
Chloride: 101 mmol/L (ref 101–111)
Glucose, Bld: 92 mg/dL (ref 65–99)
POTASSIUM: 3.7 mmol/L (ref 3.5–5.1)
Sodium: 132 mmol/L — ABNORMAL LOW (ref 135–145)
TOTAL PROTEIN: 7.3 g/dL (ref 6.5–8.1)

## 2017-06-18 LAB — CBC WITH DIFFERENTIAL/PLATELET
BASOS ABS: 0 10*3/uL (ref 0–0.1)
Basophils Relative: 1 %
EOS ABS: 0.1 10*3/uL (ref 0–0.7)
Eosinophils Relative: 2 %
HCT: 28.3 % — ABNORMAL LOW (ref 35.0–47.0)
HEMOGLOBIN: 9.6 g/dL — AB (ref 12.0–16.0)
LYMPHS ABS: 0.9 10*3/uL — AB (ref 1.0–3.6)
LYMPHS PCT: 27 %
MCH: 33.5 pg (ref 26.0–34.0)
MCHC: 33.8 g/dL (ref 32.0–36.0)
MCV: 99.1 fL (ref 80.0–100.0)
Monocytes Absolute: 0.2 10*3/uL (ref 0.2–0.9)
Monocytes Relative: 6 %
NEUTROS PCT: 64 %
Neutro Abs: 2.1 10*3/uL (ref 1.4–6.5)
PLATELETS: 179 10*3/uL (ref 150–440)
RBC: 2.85 MIL/uL — AB (ref 3.80–5.20)
RDW: 15.7 % — ABNORMAL HIGH (ref 11.5–14.5)
WBC: 3.3 10*3/uL — AB (ref 3.6–11.0)

## 2017-06-18 MED ORDER — DIPHENHYDRAMINE HCL 50 MG/ML IJ SOLN
50.0000 mg | Freq: Once | INTRAMUSCULAR | Status: AC
Start: 2017-06-18 — End: 2017-06-18
  Administered 2017-06-18: 50 mg via INTRAVENOUS
  Filled 2017-06-18: qty 1

## 2017-06-18 MED ORDER — SODIUM CHLORIDE 0.9 % IV SOLN
20.0000 mg | Freq: Once | INTRAVENOUS | Status: AC
  Administered 2017-06-18: 20 mg via INTRAVENOUS
  Filled 2017-06-18: qty 2

## 2017-06-18 MED ORDER — SODIUM CHLORIDE 0.9 % IV SOLN
Freq: Once | INTRAVENOUS | Status: AC
  Administered 2017-06-18: 14:00:00 via INTRAVENOUS
  Filled 2017-06-18: qty 100

## 2017-06-18 MED ORDER — FAMOTIDINE IN NACL 20-0.9 MG/50ML-% IV SOLN: 20.0000 mg | Freq: Once | INTRAVENOUS | Status: DC

## 2017-06-18 MED ORDER — SODIUM CHLORIDE 0.9 % IV SOLN
Freq: Once | INTRAVENOUS | Status: AC
  Administered 2017-06-18: 14:00:00 via INTRAVENOUS
  Filled 2017-06-18: qty 1000

## 2017-06-18 MED ORDER — HEPARIN SOD (PORK) LOCK FLUSH 100 UNIT/ML IV SOLN
500.0000 [IU] | Freq: Once | INTRAVENOUS | Status: AC | PRN
  Administered 2017-06-18: 500 [IU]
  Filled 2017-06-18: qty 5

## 2017-06-18 MED ORDER — PACLITAXEL CHEMO INJECTION 300 MG/50ML
80.0000 mg/m2 | Freq: Once | INTRAVENOUS | Status: AC
  Administered 2017-06-18: 138 mg via INTRAVENOUS
  Filled 2017-06-18: qty 23

## 2017-06-18 MED ORDER — SODIUM CHLORIDE 0.9% FLUSH
10.0000 mL | INTRAVENOUS | Status: DC | PRN
  Filled 2017-06-18: qty 10

## 2017-06-18 NOTE — Progress Notes (Signed)
Patient Holly Parker returns to clinic this afternoon for her Month 3 study visit on the [place] UPBEAT study. Patient's husband is with her today and she reports having had her 13-month Cardiac MRI at [facility] yesterday as scheduled. [name] verified that she has been fasting for more then 3 hours prior to coming in to the clinic. She had regular labs and study labs collected prior to other study procedures because she is also scheduled to have her Taxol infusion this afternoon. Height & weight along with Blood pressure and pulse x 2 were obtained per protocol specifications. Patient then completed the baseline 6-minute walk, Disabilities measures and SPPB while in clinic. Her ECOG performance status remains at 0; she is currently fully [place] with no restrictions. Weight without shoes is 140.4 lbs (63.7kg). Height also measured without shoes at 65.7 inches(167cm). B/P measured in right arm after having patient rest on bench with back supported and legs uncrossed. Initial B/P reading was 103/67and HR = 82 bpm. Second B/P obtained after one minute interval was 99/66 and HR = 78. Body Mass index was calculated at 22.3 per NIH BMI calculator. Patient was administered her 3 month neurocognitive booklet and also completed the 3 month questionnaire booklet while in clinic today. She was given a $25 gift card for completing this study time-point. Patient informed that her next study visit will be conducted at about 12 months - which is near the end of November, and will schedule that visit when she completes her radiation therapy and attempt to correlate it with other scheduled visits if possible. [name], BSN, MHA, OCN 06/18/2017 2:58 PM

## 2017-06-25 ENCOUNTER — Inpatient Hospital Stay

## 2017-06-25 VITALS — BP 107/69 | HR 74 | Temp 98.3°F | Resp 18 | Wt 141.8 lb

## 2017-06-25 DIAGNOSIS — C50211 Malignant neoplasm of upper-inner quadrant of right female breast: Secondary | ICD-10-CM

## 2017-06-25 DIAGNOSIS — Z171 Estrogen receptor negative status [ER-]: Principal | ICD-10-CM

## 2017-06-25 DIAGNOSIS — Z5111 Encounter for antineoplastic chemotherapy: Secondary | ICD-10-CM | POA: Diagnosis not present

## 2017-06-25 LAB — CBC WITH DIFFERENTIAL/PLATELET
BASOS ABS: 0 10*3/uL (ref 0–0.1)
Basophils Relative: 1 %
EOS ABS: 0.1 10*3/uL (ref 0–0.7)
Eosinophils Relative: 2 %
HCT: 26.4 % — ABNORMAL LOW (ref 35.0–47.0)
Hemoglobin: 9.3 g/dL — ABNORMAL LOW (ref 12.0–16.0)
LYMPHS ABS: 0.8 10*3/uL — AB (ref 1.0–3.6)
Lymphocytes Relative: 29 %
MCH: 34.9 pg — AB (ref 26.0–34.0)
MCHC: 35.1 g/dL (ref 32.0–36.0)
MCV: 99.4 fL (ref 80.0–100.0)
Monocytes Absolute: 0.4 10*3/uL (ref 0.2–0.9)
Monocytes Relative: 14 %
Neutro Abs: 1.5 10*3/uL (ref 1.4–6.5)
Neutrophils Relative %: 54 %
Platelets: 220 10*3/uL (ref 150–440)
RBC: 2.65 MIL/uL — AB (ref 3.80–5.20)
RDW: 15.2 % — ABNORMAL HIGH (ref 11.5–14.5)
WBC: 2.8 10*3/uL — AB (ref 3.6–11.0)

## 2017-06-25 LAB — COMPREHENSIVE METABOLIC PANEL
ALT: 39 U/L (ref 14–54)
AST: 31 U/L (ref 15–41)
Albumin: 4.1 g/dL (ref 3.5–5.0)
Alkaline Phosphatase: 62 U/L (ref 38–126)
Anion gap: 7 (ref 5–15)
BILIRUBIN TOTAL: 0.3 mg/dL (ref 0.3–1.2)
BUN: 15 mg/dL (ref 6–20)
CO2: 27 mmol/L (ref 22–32)
CREATININE: 0.5 mg/dL (ref 0.44–1.00)
Calcium: 9.6 mg/dL (ref 8.9–10.3)
Chloride: 105 mmol/L (ref 101–111)
GFR calc Af Amer: >60 mL/min (ref >60)
Glucose, Bld: 105 mg/dL — ABNORMAL HIGH (ref 65–99)
Potassium: 3.9 mmol/L (ref 3.5–5.1)
Sodium: 139 mmol/L (ref 135–145)
TOTAL PROTEIN: 7.3 g/dL (ref 6.5–8.1)

## 2017-06-25 MED ORDER — SODIUM CHLORIDE 0.9 % IV SOLN
20.0000 mg | Freq: Once | INTRAVENOUS | Status: AC
  Administered 2017-06-25: 20 mg via INTRAVENOUS
  Filled 2017-06-25: qty 2

## 2017-06-25 MED ORDER — SODIUM CHLORIDE 0.9 % IV SOLN
80.0000 mg/m2 | Freq: Once | INTRAVENOUS | Status: AC
  Administered 2017-06-25: 138 mg via INTRAVENOUS
  Filled 2017-06-25: qty 23

## 2017-06-25 MED ORDER — SODIUM CHLORIDE 0.9% FLUSH
10.0000 mL | INTRAVENOUS | Status: DC | PRN
  Administered 2017-06-25: 10 mL
  Filled 2017-06-25: qty 10

## 2017-06-25 MED ORDER — SODIUM CHLORIDE 0.9 % IV SOLN
Freq: Once | INTRAVENOUS | Status: AC
  Administered 2017-06-25: 14:00:00 via INTRAVENOUS
  Filled 2017-06-25: qty 1000

## 2017-06-25 MED ORDER — HEPARIN SOD (PORK) LOCK FLUSH 100 UNIT/ML IV SOLN
500.0000 [IU] | Freq: Once | INTRAVENOUS | Status: AC | PRN
  Administered 2017-06-25: 500 [IU]
  Filled 2017-06-25: qty 5

## 2017-06-25 MED ORDER — DIPHENHYDRAMINE HCL 50 MG/ML IJ SOLN
50.0000 mg | Freq: Once | INTRAMUSCULAR | Status: AC
  Administered 2017-06-25: 50 mg via INTRAVENOUS
  Filled 2017-06-25: qty 1

## 2017-06-25 MED ORDER — SODIUM CHLORIDE 0.9 % IV SOLN
Freq: Once | INTRAVENOUS | Status: AC
  Administered 2017-06-25: 15:00:00 via INTRAVENOUS
  Filled 2017-06-25: qty 100

## 2017-06-25 MED ORDER — FAMOTIDINE IN NACL 20-0.9 MG/50ML-% IV SOLN: 20.0000 mg | Freq: Once | INTRAVENOUS | Status: DC

## 2017-07-01 ENCOUNTER — Encounter: Payer: Self-pay | Admitting: *Deleted

## 2017-07-01 NOTE — Progress Notes (Signed)
  Oncology Nurse Navigator Documentation  Navigator Location: [facility] (07/01/17 0800)   )Navigator Encounter Type: Letter/Fax/Email (07/01/17 0800)                       Treatment Phase: Active Tx (07/01/17 0800)                            Time Spent with Patient: 15 (07/01/17 0800)   Thinking of you card mailed to patient.

## 2017-07-02 ENCOUNTER — Ambulatory Visit: Admitting: Internal Medicine

## 2017-07-02 ENCOUNTER — Other Ambulatory Visit

## 2017-07-02 ENCOUNTER — Ambulatory Visit

## 2017-07-03 ENCOUNTER — Telehealth: Payer: Self-pay | Admitting: Internal Medicine

## 2017-07-03 ENCOUNTER — Inpatient Hospital Stay (HOSPITAL_BASED_OUTPATIENT_CLINIC_OR_DEPARTMENT_OTHER): Admitting: Internal Medicine

## 2017-07-03 ENCOUNTER — Encounter: Payer: Self-pay | Admitting: Internal Medicine

## 2017-07-03 ENCOUNTER — Other Ambulatory Visit: Payer: Self-pay

## 2017-07-03 ENCOUNTER — Inpatient Hospital Stay: Attending: Internal Medicine

## 2017-07-03 ENCOUNTER — Inpatient Hospital Stay

## 2017-07-03 VITALS — BP 122/82 | HR 93 | Temp 97.9°F | Resp 20 | Ht 67.0 in | Wt 141.0 lb

## 2017-07-03 DIAGNOSIS — Z5111 Encounter for antineoplastic chemotherapy: Secondary | ICD-10-CM | POA: Diagnosis present

## 2017-07-03 DIAGNOSIS — C50211 Malignant neoplasm of upper-inner quadrant of right female breast: Secondary | ICD-10-CM

## 2017-07-03 DIAGNOSIS — R51 Headache: Secondary | ICD-10-CM | POA: Diagnosis not present

## 2017-07-03 DIAGNOSIS — Z171 Estrogen receptor negative status [ER-]: Secondary | ICD-10-CM

## 2017-07-03 DIAGNOSIS — R0981 Nasal congestion: Secondary | ICD-10-CM | POA: Diagnosis not present

## 2017-07-03 DIAGNOSIS — D649 Anemia, unspecified: Secondary | ICD-10-CM

## 2017-07-03 LAB — CBC WITH DIFFERENTIAL/PLATELET
BASOS ABS: 0 10*3/uL (ref 0–0.1)
Basophils Relative: 1 %
Eosinophils Absolute: 0 10*3/uL (ref 0–0.7)
Eosinophils Relative: 1 %
HEMATOCRIT: 30 % — AB (ref 35.0–47.0)
Hemoglobin: 10.6 g/dL — ABNORMAL LOW (ref 12.0–16.0)
LYMPHS ABS: 1 10*3/uL (ref 1.0–3.6)
LYMPHS PCT: 20 %
MCH: 35.5 pg — AB (ref 26.0–34.0)
MCHC: 35.3 g/dL (ref 32.0–36.0)
MCV: 100.5 fL — AB (ref 80.0–100.0)
Monocytes Absolute: 0.5 10*3/uL (ref 0.2–0.9)
Monocytes Relative: 10 %
NEUTROS ABS: 3.3 10*3/uL (ref 1.4–6.5)
Neutrophils Relative %: 68 %
Platelets: 164 10*3/uL (ref 150–440)
RBC: 2.99 MIL/uL — AB (ref 3.80–5.20)
RDW: 15.3 % — ABNORMAL HIGH (ref 11.5–14.5)
WBC: 4.9 10*3/uL (ref 3.6–11.0)

## 2017-07-03 LAB — COMPREHENSIVE METABOLIC PANEL
ALK PHOS: 66 U/L (ref 38–126)
ALT: 27 U/L (ref 14–54)
AST: 31 U/L (ref 15–41)
Albumin: 4 g/dL (ref 3.5–5.0)
Anion gap: 8 (ref 5–15)
BILIRUBIN TOTAL: 0.5 mg/dL (ref 0.3–1.2)
BUN: 13 mg/dL (ref 6–20)
CALCIUM: 9.4 mg/dL (ref 8.9–10.3)
CO2: 25 mmol/L (ref 22–32)
CREATININE: 0.62 mg/dL (ref 0.44–1.00)
Chloride: 103 mmol/L (ref 101–111)
GFR calc non Af Amer: >60 mL/min (ref >60)
Glucose, Bld: 105 mg/dL — ABNORMAL HIGH (ref 65–99)
Potassium: 3.9 mmol/L (ref 3.5–5.1)
Sodium: 136 mmol/L (ref 135–145)
TOTAL PROTEIN: 7.4 g/dL (ref 6.5–8.1)

## 2017-07-03 MED ORDER — DIPHENHYDRAMINE HCL 50 MG/ML IJ SOLN
50.0000 mg | Freq: Once | INTRAMUSCULAR | Status: AC
  Administered 2017-07-03: 50 mg via INTRAVENOUS
  Filled 2017-07-03: qty 1

## 2017-07-03 MED ORDER — SODIUM CHLORIDE 0.9 % IV SOLN
Freq: Once | INTRAVENOUS | Status: AC
  Administered 2017-07-03: 09:00:00 via INTRAVENOUS
  Filled 2017-07-03: qty 1000

## 2017-07-03 MED ORDER — FAMOTIDINE IN NACL 20-0.9 MG/50ML-% IV SOLN: 20.0000 mg | Freq: Once | INTRAVENOUS | Status: DC

## 2017-07-03 MED ORDER — HEPARIN SOD (PORK) LOCK FLUSH 100 UNIT/ML IV SOLN
500.0000 [IU] | Freq: Once | INTRAVENOUS | Status: DC
  Filled 2017-07-03: qty 5

## 2017-07-03 MED ORDER — SODIUM CHLORIDE 0.9 % IV SOLN
492.0000 mg | Freq: Once | INTRAVENOUS | Status: AC
  Administered 2017-07-03: 490 mg via INTRAVENOUS
  Filled 2017-07-03: qty 49

## 2017-07-03 MED ORDER — FAMOTIDINE 200 MG/20ML IV SOLN
Freq: Once | INTRAVENOUS | Status: AC
  Administered 2017-07-03: 10:00:00 via INTRAVENOUS
  Filled 2017-07-03: qty 100

## 2017-07-03 MED ORDER — HEPARIN SOD (PORK) LOCK FLUSH 100 UNIT/ML IV SOLN
500.0000 [IU] | Freq: Once | INTRAVENOUS | Status: AC | PRN
  Administered 2017-07-03: 500 [IU]

## 2017-07-03 MED ORDER — SODIUM CHLORIDE 0.9 % IV SOLN
20.0000 mg | Freq: Once | INTRAVENOUS | Status: AC
  Administered 2017-07-03: 20 mg via INTRAVENOUS
  Filled 2017-07-03: qty 2

## 2017-07-03 MED ORDER — PALONOSETRON HCL INJECTION 0.25 MG/5ML
0.2500 mg | Freq: Once | INTRAVENOUS | Status: AC
  Administered 2017-07-03: 0.25 mg via INTRAVENOUS
  Filled 2017-07-03: qty 5

## 2017-07-03 MED ORDER — SODIUM CHLORIDE 0.9% FLUSH
10.0000 mL | INTRAVENOUS | Status: DC | PRN
  Administered 2017-07-03: 10 mL via INTRAVENOUS
  Filled 2017-07-03: qty 10

## 2017-07-03 MED ORDER — SODIUM CHLORIDE 0.9 % IV SOLN
80.0000 mg/m2 | Freq: Once | INTRAVENOUS | Status: AC
  Administered 2017-07-03: 138 mg via INTRAVENOUS
  Filled 2017-07-03: qty 23

## 2017-07-03 NOTE — Assessment & Plan Note (Addendum)
#  Stage I ER/PR/HER-2/neu negative right breast cancer-currently on neoadjuvant chemotherapy- ddAC- s/p 4 cycles; ammo/US- improved ~37m nodule. On cBotswanaq3/taxol weekly s/p cycle #2.   # Proceed with cGeronimo Boot4] q3w #3; taxol weekly; fatigue- CBC CMP are reviewed; adequate today; no contraindications to chemotherapy. Proceed with treatment today- except anemia.   # Headaches/ ? Allergies- recommend sudafed.   # s/p appt- Dr.Blackman;[facility]- reminded likely plan of surgery- end of April or may starting. If logistically not possible- then will recommend Dr.Byrnett.   #Patient will need also need bilateral salpingo-oophorectomy down the line.  # follow up 3 weeks-/MDcarbo-taxol; weekly taxol x2.

## 2017-07-03 NOTE — Progress Notes (Signed)
[facility] NOTE  Patient Care Team: Patient, No Pcp Per as PCP - General (General Practice) Magrinat, Virgie Dad, MD as Consulting Physician (Oncology) Armandina Stammer, MD (Obstetrics and Gynecology) Dillingham, Loel Lofty, DO as Attending Physician (Plastic Surgery) Coralie Keens, MD as Consulting Physician (General Surgery) Cammie Sickle, MD as Consulting Physician (Internal Medicine)  CHIEF COMPLAINTS/PURPOSE OF CONSULTATION:  Breast cancer  #  Oncology History   # OCT 2018- RIGHT BREAST UIQ -[place]; TRIPLE NEGATIVE; ki-67-high [54m-1'O clock-Bx- proven; [place]]; another- 12'O clock-4438m Not biopsied. [s/p Dr.Magrinaut; [place]]; cT1c(m) cN0;  G-3; ki-67- 80%  # 11/218- ddACesolved;  x4 cyles- US/ammo- improved 38m83mothe leison-r-Taxol+carbo  # BRCA-1 Gene mutated [mom-ovarian cancer-Stage IIIB/Brca; older sister- breast ca/brca-mutated]  # MUGA scan [nov 2018]- 67%; UPBEAT clinical trial     Malignant neoplasm of upper-inner quadrant of right breast in female, estrogen receptor negative ([place]  HISTORY OF PRESENTING ILLNESS:  [name] 47o.  female with a history of BRCA1 mutation; triple negative stage I breast cancer-currently on neoadjuvant chemotherapy status -currently on carboplatin every 3 weeks/Taxol weekly is here for follow-up.  She is currently status post 2 cycles.   Patient had episodes of intermittent headache over the last few weeks.  She does have sinus congestion.  She is on Claritin.  Denies any nausea vomiting.  Patient denies any unusual shortness of breath or cough.  No fevers chills.  No tingling or numbness.  ROS: A complete 10 point review of system is done which is negative except mentioned above in history of present illness  MEDICAL HISTORY:  Past Medical History:  Diagnosis Date  . Asthma    Excercise induced  . Fibromyalgia   . Malignant neoplasm of upper-inner quadrant of right breast in female,  estrogen receptor negative ([id number]   right breast  . Personal history of chemotherapy    Right breast- 4 treatments    SURGICAL HISTORY: Past Surgical History:  Procedure Laterality Date  . IR FLUORO GUIDE PORT INSERTION LEFT  03/17/2017  . KNEE SURGERY Left    Torn meniscus   . 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 Marital status: Married    Spouse name: Not on file  . Number of children: Not on file  . Years of education: Not on file  . Highest education level: Not on file  Social Needs  . Financial resource strain: Not on file  . Food insecurity - worry: Not on file  . Food insecurity - inability: Not on file  . Transportation needs - medical: Not on file  . Transportation needs - non-medical: Not on file  Occupational History  . Not on file  Tobacco Use  . Smoking status: Former Smoker    Types: Cigarettes    Last attempt to quit: 09/16/2011    Years since quitting: 5.8  . Smokeless tobacco: Never Used  Substance and Sexual Activity  . Alcohol use: No  . Drug use: No  . Sexual activity: Yes  Other Topics Concern  . Not on file  Social History Narrative  . Not on file    FAMILY HISTORY: Family History  Problem Relation Age of Onset  . Cancer Mother        Ovarian cancer (2003), breast cancer(2017) & squamous cell   . Leukemia Mother   . Breast cancer Mother 63 42  Cancer Sister        breast cancer  . Breast cancer Sister 40  . Melanoma Father   . Lung cancer Paternal Grandmother     ALLERGIES:  has No Known Allergies.  MEDICATIONS:  Current Outpatient Medications  Medication Sig Dispense Refill  . sertraline (ZOLOFT) 100 MG tablet Take 100 mg by mouth.    Marland Kitchen aspirin-acetaminophen-caffeine (EXCEDRIN MIGRAINE) 250-250-65 MG per tablet Take 1 tablet by mouth as needed for headache.    . cromolyn (NASALCROM) 5.2 MG/ACT nasal spray Place 1 spray  into both nostrils 4 (four) times daily as needed for allergies or rhinitis. (Patient not taking: Reported on 07/03/2017) 26 mL 0  . DM-Phenylephrine-Acetaminophen (ROBITUSSIN COLD+FLU DAYTIME PO) Take 15 mLs by mouth every 6 (six) hours.    . lidocaine-prilocaine (EMLA) cream Apply 1 application as needed topically. Apply generously over the Mediport 45 minutes prior to chemotherapy. (Patient not taking: Reported on 07/03/2017) 30 g 0  . loratadine (CLARITIN) 10 MG tablet Take 10 mg daily by mouth.    . ondansetron (ZOFRAN) 8 MG tablet One pill every 8 hours as needed for nausea/vomitting. (Patient not taking: Reported on 07/03/2017) 40 tablet 1  . prochlorperazine (COMPAZINE) 10 MG tablet Take 1 tablet (10 mg total) every 6 (six) hours as needed by mouth for nausea or vomiting. (Patient not taking: Reported on 07/03/2017) 40 tablet 1  . Pseudoephedrine-Naproxen Na (ALEVE-D SINUS & HEADACHE PO) Take 1 each by mouth as needed.     No current facility-administered medications for this visit.    Facility-Administered Medications Ordered in Other Visits  Medication Dose Route Frequency Provider Last Rate Last Dose  . CARBOplatin (PARAPLATIN) 490 mg in sodium chloride 0.9 % 250 mL chemo infusion  490 mg Intravenous Once [name] R, MD      . heparin lock flush 100 unit/mL  500 Units Intravenous Once [name] R, MD      . heparin lock flush 100 unit/mL  500 Units Intracatheter Once PRN Cammie Sickle, MD      . PACLitaxel (TAXOL) 138 mg in sodium chloride 0.9 % 250 mL chemo infusion (</= 12m/m2)  80 mg/m2 (Treatment Plan Recorded) Intravenous Once BCharlaine DaltonR, MD      . sodium chloride 0.9 % 100 mL with famotidine (PEPCID) infusion   Intravenous Once BCharlaine DaltonR, MD      . sodium chloride flush (NS) 0.9 % injection 10 mL  10 mL Intravenous PRN BCammie Sickle MD   10 mL at 07/03/17 0846      .  PHYSICAL EXAMINATION: ECOG PERFORMANCE STATUS: 0 -  Asymptomatic  Vitals:   07/03/17 0845  BP: 122/82  Pulse: 93  Resp: 20  Temp: 97.9 F (36.6 C)   Filed Weights   07/03/17 0900  Weight: 141 lb (64 kg)    GENERAL: Well-nourished well-developed; Alert, no distress and comfortable.   She is alone. EYES: no pallor or icterus OROPHARYNX: no thrush or ulceration; good dentition  NECK: supple, no masses felt LYMPH:  no palpable lymphadenopathy in the cervical, axillary or inguinal regions LUNGS: clear to auscultation and  No wheeze or crackles HEART/CVS: regular rate & rhythm and no murmurs; No lower extremity edema ABDOMEN: abdomen soft, non-tender and normal bowel sounds Musculoskeletal:no cyanosis of digits and no clubbing  PSYCH: alert & oriented x 3 with fluent speech NEURO: no focal motor/sensory deficits SKIN:  no rashes or significant lesions;  LABORATORY DATA:  I have  reviewed the data as listed Lab Results  Component Value Date   WBC 4.9 07/03/2017   HGB 10.6 (L) 07/03/2017   HCT 30.0 (L) 07/03/2017   MCV 100.5 (H) 07/03/2017   PLT 164 07/03/2017   Recent Labs    06/18/17 1307 06/25/17 1323 07/03/17 0846  NA 132* 139 136  K 3.7 3.9 3.9  CL 101 105 103  CO2 '24 27 25  ' GLUCOSE 92 105* 105*  BUN 23* 15 13  CREATININE 0.52 0.50 0.62  CALCIUM 9.1 9.6 9.4  GFRNONAA >60 >60 >60  GFRAA >60 >60 >60  PROT 7.3 7.3 7.4  ALBUMIN 4.3 4.1 4.0  AST '29 31 31  ' ALT 33 39 27  ALKPHOS 58 62 66  BILITOT 0.5 0.3 0.5    RADIOGRAPHIC STUDIES: I have personally reviewed the radiological images as listed and agreed with the findings in the report. No results found.  ASSESSMENT & PLAN:   Malignant neoplasm of upper-inner quadrant of right breast in female, estrogen receptor negative ([place]) # Stage I ER/PR/HER-2/neu negative right breast cancer-currently on neoadjuvant chemotherapy- ddAC- s/p 4 cycles; ammo/US- improved ~50m nodule. On cBotsw[place]q3/taxol weekly s/p cycle #2.   # Proceed with cGeronimo Boot4] q3w #3; taxol weekly;  fatigue- CBC CMP are reviewed; adequate today; no contraindications to chemotherapy. Proceed with treatment today- except anemia.   # Headaches/ ? Allergies- recommend sudafed.   # s/p appt- Dr.Blackman;[facility]- reminded likely plan of surgery- end of April or may starting. If logistically not possible- then will recommend Dr.Byrnett.   #Patient will need also need bilateral salpingo-oophorectomy down the line.  # follow up 3 weeks-/MDcarbo-taxol; weekly taxol x2.   All questions were answered. The patient knows to call the clinic with any problems, questions or concerns.    GCammie Sickle MD 07/03/2017 9:49 AM

## 2017-07-03 NOTE — Telephone Encounter (Signed)
Labs entered per md order 

## 2017-07-03 NOTE — Telephone Encounter (Signed)
Please order CBC weekly x2 with Taxol; CBC CMP with carbotaxol in 3 weeks.Thx!

## 2017-07-03 NOTE — Addendum Note (Signed)
Addended by: Sabino Gasser on: 07/03/2017 10:03 AM   Modules accepted: Orders

## 2017-07-09 ENCOUNTER — Inpatient Hospital Stay

## 2017-07-09 VITALS — BP 108/71 | HR 98 | Temp 97.9°F | Resp 18 | Wt 141.1 lb

## 2017-07-09 DIAGNOSIS — C50211 Malignant neoplasm of upper-inner quadrant of right female breast: Secondary | ICD-10-CM

## 2017-07-09 DIAGNOSIS — Z5111 Encounter for antineoplastic chemotherapy: Secondary | ICD-10-CM | POA: Diagnosis not present

## 2017-07-09 DIAGNOSIS — Z171 Estrogen receptor negative status [ER-]: Principal | ICD-10-CM

## 2017-07-09 LAB — COMPREHENSIVE METABOLIC PANEL
ALBUMIN: 3.9 g/dL (ref 3.5–5.0)
ALK PHOS: 51 U/L (ref 38–126)
ALT: 19 U/L (ref 14–54)
AST: 22 U/L (ref 15–41)
Anion gap: 9 (ref 5–15)
BUN: 17 mg/dL (ref 6–20)
CALCIUM: 8.9 mg/dL (ref 8.9–10.3)
CO2: 25 mmol/L (ref 22–32)
CREATININE: 0.48 mg/dL (ref 0.44–1.00)
Chloride: 101 mmol/L (ref 101–111)
GFR calc Af Amer: >60 mL/min (ref >60)
GFR calc non Af Amer: >60 mL/min (ref >60)
GLUCOSE: 105 mg/dL — AB (ref 65–99)
Potassium: 4 mmol/L (ref 3.5–5.1)
Sodium: 135 mmol/L (ref 135–145)
Total Bilirubin: 0.2 mg/dL — ABNORMAL LOW (ref 0.3–1.2)
Total Protein: 7.1 g/dL (ref 6.5–8.1)

## 2017-07-09 LAB — CBC WITH DIFFERENTIAL/PLATELET
BASOS PCT: 1 %
Basophils Absolute: 0 10*3/uL (ref 0–0.1)
EOS ABS: 0 10*3/uL (ref 0–0.7)
Eosinophils Relative: 1 %
HCT: 27.8 % — ABNORMAL LOW (ref 35.0–47.0)
HEMOGLOBIN: 9.7 g/dL — AB (ref 12.0–16.0)
LYMPHS ABS: 0.9 10*3/uL — AB (ref 1.0–3.6)
Lymphocytes Relative: 25 %
MCH: 35.1 pg — AB (ref 26.0–34.0)
MCHC: 35 g/dL (ref 32.0–36.0)
MCV: 100.3 fL — ABNORMAL HIGH (ref 80.0–100.0)
Monocytes Absolute: 0.1 10*3/uL — ABNORMAL LOW (ref 0.2–0.9)
Monocytes Relative: 4 %
NEUTROS PCT: 69 %
Neutro Abs: 2.5 10*3/uL (ref 1.4–6.5)
Platelets: 156 10*3/uL (ref 150–440)
RBC: 2.77 MIL/uL — AB (ref 3.80–5.20)
RDW: 15 % — ABNORMAL HIGH (ref 11.5–14.5)
WBC: 3.6 10*3/uL (ref 3.6–11.0)

## 2017-07-09 MED ORDER — SODIUM CHLORIDE 0.9% FLUSH
10.0000 mL | INTRAVENOUS | Status: DC | PRN
  Filled 2017-07-09: qty 10

## 2017-07-09 MED ORDER — SODIUM CHLORIDE 0.9 % IV SOLN
80.0000 mg/m2 | Freq: Once | INTRAVENOUS | Status: AC
  Administered 2017-07-09: 138 mg via INTRAVENOUS
  Filled 2017-07-09: qty 23

## 2017-07-09 MED ORDER — DIPHENHYDRAMINE HCL 50 MG/ML IJ SOLN
50.0000 mg | Freq: Once | INTRAMUSCULAR | Status: AC
  Administered 2017-07-09: 50 mg via INTRAVENOUS
  Filled 2017-07-09: qty 1

## 2017-07-09 MED ORDER — SODIUM CHLORIDE 0.9 % IV SOLN
Freq: Once | INTRAVENOUS | Status: AC
  Administered 2017-07-09: 14:00:00 via INTRAVENOUS
  Filled 2017-07-09: qty 1000

## 2017-07-09 MED ORDER — FAMOTIDINE IN NACL 20-0.9 MG/50ML-% IV SOLN: 20.0000 mg | Freq: Once | INTRAVENOUS | Status: DC

## 2017-07-09 MED ORDER — HEPARIN SOD (PORK) LOCK FLUSH 100 UNIT/ML IV SOLN
500.0000 [IU] | Freq: Once | INTRAVENOUS | Status: AC | PRN
  Administered 2017-07-09: 500 [IU]
  Filled 2017-07-09: qty 5

## 2017-07-09 MED ORDER — SODIUM CHLORIDE 0.9 % IV SOLN
20.0000 mg | Freq: Once | INTRAVENOUS | Status: AC
  Administered 2017-07-09: 20 mg via INTRAVENOUS
  Filled 2017-07-09: qty 2

## 2017-07-09 MED ORDER — SODIUM CHLORIDE 0.9 % IV SOLN
Freq: Once | INTRAVENOUS | Status: AC
  Administered 2017-07-09: 14:00:00 via INTRAVENOUS
  Filled 2017-07-09: qty 100

## 2017-07-16 ENCOUNTER — Inpatient Hospital Stay

## 2017-07-16 ENCOUNTER — Encounter: Payer: Self-pay | Admitting: Internal Medicine

## 2017-07-16 VITALS — BP 122/80 | HR 99 | Temp 97.5°F | Resp 18

## 2017-07-16 DIAGNOSIS — Z171 Estrogen receptor negative status [ER-]: Principal | ICD-10-CM

## 2017-07-16 DIAGNOSIS — C50211 Malignant neoplasm of upper-inner quadrant of right female breast: Secondary | ICD-10-CM

## 2017-07-16 DIAGNOSIS — Z5111 Encounter for antineoplastic chemotherapy: Secondary | ICD-10-CM | POA: Diagnosis not present

## 2017-07-16 LAB — CBC WITH DIFFERENTIAL/PLATELET
BASOS PCT: 0 %
Basophils Absolute: 0 10*3/uL (ref 0–0.1)
EOS PCT: 2 %
Eosinophils Absolute: 0 10*3/uL (ref 0–0.7)
HCT: 28.6 % — ABNORMAL LOW (ref 35.0–47.0)
Hemoglobin: 10 g/dL — ABNORMAL LOW (ref 12.0–16.0)
LYMPHS PCT: 30 %
Lymphs Abs: 0.8 10*3/uL — ABNORMAL LOW (ref 1.0–3.6)
MCH: 34.8 pg — ABNORMAL HIGH (ref 26.0–34.0)
MCHC: 35.1 g/dL (ref 32.0–36.0)
MCV: 99.1 fL (ref 80.0–100.0)
MONO ABS: 0.4 10*3/uL (ref 0.2–0.9)
Monocytes Relative: 16 %
Neutro Abs: 1.4 10*3/uL (ref 1.4–6.5)
Neutrophils Relative %: 52 %
PLATELETS: 230 10*3/uL (ref 150–440)
RBC: 2.89 MIL/uL — ABNORMAL LOW (ref 3.80–5.20)
RDW: 14.3 % (ref 11.5–14.5)
WBC: 2.7 10*3/uL — ABNORMAL LOW (ref 3.6–11.0)

## 2017-07-16 LAB — COMPREHENSIVE METABOLIC PANEL
ALBUMIN: 4 g/dL (ref 3.5–5.0)
ALK PHOS: 67 U/L (ref 38–126)
ALT: 30 U/L (ref 14–54)
AST: 34 U/L (ref 15–41)
Anion gap: 9 (ref 5–15)
BILIRUBIN TOTAL: 0.5 mg/dL (ref 0.3–1.2)
BUN: 15 mg/dL (ref 6–20)
CALCIUM: 9.4 mg/dL (ref 8.9–10.3)
CO2: 25 mmol/L (ref 22–32)
CREATININE: 0.55 mg/dL (ref 0.44–1.00)
Chloride: 102 mmol/L (ref 101–111)
GFR calc Af Amer: >60 mL/min (ref >60)
GFR calc non Af Amer: >60 mL/min (ref >60)
GLUCOSE: 141 mg/dL — AB (ref 65–99)
Potassium: 3.5 mmol/L (ref 3.5–5.1)
Sodium: 136 mmol/L (ref 135–145)
TOTAL PROTEIN: 7.6 g/dL (ref 6.5–8.1)

## 2017-07-16 MED ORDER — HEPARIN SOD (PORK) LOCK FLUSH 100 UNIT/ML IV SOLN
500.0000 [IU] | Freq: Once | INTRAVENOUS | Status: AC
  Administered 2017-07-16: 500 [IU] via INTRAVENOUS

## 2017-07-16 MED ORDER — FAMOTIDINE IN NACL 20-0.9 MG/50ML-% IV SOLN: 20.0000 mg | Freq: Once | INTRAVENOUS | Status: DC

## 2017-07-16 MED ORDER — SODIUM CHLORIDE 0.9 % IV SOLN
Freq: Once | INTRAVENOUS | Status: AC
  Administered 2017-07-16: 14:00:00 via INTRAVENOUS
  Filled 2017-07-16: qty 1000

## 2017-07-16 MED ORDER — PEGFILGRASTIM 6 MG/0.6ML ~~LOC~~ PSKT
6.0000 mg | PREFILLED_SYRINGE | Freq: Once | SUBCUTANEOUS | Status: DC
  Filled 2017-07-16: qty 0.6

## 2017-07-16 MED ORDER — PACLITAXEL CHEMO INJECTION 300 MG/50ML
80.0000 mg/m2 | Freq: Once | INTRAVENOUS | Status: AC
  Administered 2017-07-16: 138 mg via INTRAVENOUS
  Filled 2017-07-16: qty 23

## 2017-07-16 MED ORDER — HEPARIN SOD (PORK) LOCK FLUSH 100 UNIT/ML IV SOLN
INTRAVENOUS | Status: AC
  Filled 2017-07-16: qty 5

## 2017-07-16 MED ORDER — SODIUM CHLORIDE 0.9 % IV SOLN
Freq: Once | INTRAVENOUS | Status: AC
  Administered 2017-07-16: 14:00:00 via INTRAVENOUS
  Filled 2017-07-16: qty 100

## 2017-07-16 MED ORDER — DEXAMETHASONE SODIUM PHOSPHATE 100 MG/10ML IJ SOLN
20.0000 mg | Freq: Once | INTRAMUSCULAR | Status: AC
  Administered 2017-07-16: 20 mg via INTRAVENOUS
  Filled 2017-07-16: qty 2

## 2017-07-16 MED ORDER — DIPHENHYDRAMINE HCL 50 MG/ML IJ SOLN
50.0000 mg | Freq: Once | INTRAMUSCULAR | Status: AC
  Administered 2017-07-16: 50 mg via INTRAVENOUS
  Filled 2017-07-16: qty 1

## 2017-07-23 ENCOUNTER — Inpatient Hospital Stay (HOSPITAL_BASED_OUTPATIENT_CLINIC_OR_DEPARTMENT_OTHER): Admitting: Internal Medicine

## 2017-07-23 ENCOUNTER — Other Ambulatory Visit: Payer: Self-pay

## 2017-07-23 ENCOUNTER — Inpatient Hospital Stay

## 2017-07-23 ENCOUNTER — Encounter: Payer: Self-pay | Admitting: Internal Medicine

## 2017-07-23 VITALS — BP 123/74 | HR 79 | Temp 97.2°F | Resp 18 | Ht 67.0 in | Wt 143.0 lb

## 2017-07-23 DIAGNOSIS — Z171 Estrogen receptor negative status [ER-]: Secondary | ICD-10-CM | POA: Diagnosis not present

## 2017-07-23 DIAGNOSIS — C50211 Malignant neoplasm of upper-inner quadrant of right female breast: Secondary | ICD-10-CM

## 2017-07-23 DIAGNOSIS — R51 Headache: Secondary | ICD-10-CM | POA: Diagnosis not present

## 2017-07-23 DIAGNOSIS — Z5111 Encounter for antineoplastic chemotherapy: Secondary | ICD-10-CM | POA: Diagnosis not present

## 2017-07-23 LAB — CBC WITH DIFFERENTIAL/PLATELET
Basophils Absolute: 0 10*3/uL (ref 0–0.1)
Basophils Relative: 1 %
Eosinophils Absolute: 0 10*3/uL (ref 0–0.7)
Eosinophils Relative: 1 %
HEMATOCRIT: 29.9 % — AB (ref 35.0–47.0)
HEMOGLOBIN: 10.4 g/dL — AB (ref 12.0–16.0)
LYMPHS ABS: 1 10*3/uL (ref 1.0–3.6)
LYMPHS PCT: 34 %
MCH: 35.2 pg — ABNORMAL HIGH (ref 26.0–34.0)
MCHC: 34.6 g/dL (ref 32.0–36.0)
MCV: 101.6 fL — AB (ref 80.0–100.0)
MONO ABS: 0.3 10*3/uL (ref 0.2–0.9)
MONOS PCT: 10 %
NEUTROS ABS: 1.7 10*3/uL (ref 1.4–6.5)
NEUTROS PCT: 54 %
Platelets: 143 10*3/uL — ABNORMAL LOW (ref 150–440)
RBC: 2.94 MIL/uL — ABNORMAL LOW (ref 3.80–5.20)
RDW: 15.4 % — AB (ref 11.5–14.5)
WBC: 3 10*3/uL — ABNORMAL LOW (ref 3.6–11.0)

## 2017-07-23 LAB — COMPREHENSIVE METABOLIC PANEL
ALBUMIN: 4 g/dL (ref 3.5–5.0)
ALT: 24 U/L (ref 14–54)
ANION GAP: 9 (ref 5–15)
AST: 28 U/L (ref 15–41)
Alkaline Phosphatase: 56 U/L (ref 38–126)
BUN: 16 mg/dL (ref 6–20)
CHLORIDE: 103 mmol/L (ref 101–111)
CO2: 24 mmol/L (ref 22–32)
Calcium: 9.1 mg/dL (ref 8.9–10.3)
Creatinine, Ser: 0.58 mg/dL (ref 0.44–1.00)
GFR calc non Af Amer: >60 mL/min (ref >60)
GLUCOSE: 117 mg/dL — AB (ref 65–99)
POTASSIUM: 4 mmol/L (ref 3.5–5.1)
SODIUM: 136 mmol/L (ref 135–145)
Total Bilirubin: 0.4 mg/dL (ref 0.3–1.2)
Total Protein: 7.2 g/dL (ref 6.5–8.1)

## 2017-07-23 MED ORDER — SODIUM CHLORIDE 0.9 % IV SOLN
INTRAVENOUS | Status: DC
  Administered 2017-07-23: 11:00:00 via INTRAVENOUS
  Filled 2017-07-23 (×2): qty 100

## 2017-07-23 MED ORDER — HEPARIN SOD (PORK) LOCK FLUSH 100 UNIT/ML IV SOLN
500.0000 [IU] | Freq: Once | INTRAVENOUS | Status: AC | PRN
  Administered 2017-07-23: 500 [IU]
  Filled 2017-07-23: qty 5

## 2017-07-23 MED ORDER — SODIUM CHLORIDE 0.9 % IV SOLN
488.0000 mg | Freq: Once | INTRAVENOUS | Status: AC
  Administered 2017-07-23: 490 mg via INTRAVENOUS
  Filled 2017-07-23: qty 49

## 2017-07-23 MED ORDER — FAMOTIDINE IN NACL 20-0.9 MG/50ML-% IV SOLN: 20.0000 mg | Freq: Once | INTRAVENOUS | Status: DC

## 2017-07-23 MED ORDER — DIPHENHYDRAMINE HCL 50 MG/ML IJ SOLN
50.0000 mg | Freq: Once | INTRAMUSCULAR | Status: AC
  Administered 2017-07-23: 50 mg via INTRAVENOUS
  Filled 2017-07-23: qty 1

## 2017-07-23 MED ORDER — SODIUM CHLORIDE 0.9 % IV SOLN
Freq: Once | INTRAVENOUS | Status: AC
  Administered 2017-07-23: 11:00:00 via INTRAVENOUS
  Filled 2017-07-23: qty 1000

## 2017-07-23 MED ORDER — SODIUM CHLORIDE 0.9 % IV SOLN
20.0000 mg | Freq: Once | INTRAVENOUS | Status: AC
  Administered 2017-07-23: 20 mg via INTRAVENOUS
  Filled 2017-07-23: qty 2

## 2017-07-23 MED ORDER — PALONOSETRON HCL INJECTION 0.25 MG/5ML
0.2500 mg | Freq: Once | INTRAVENOUS | Status: AC
  Administered 2017-07-23: 0.25 mg via INTRAVENOUS
  Filled 2017-07-23: qty 5

## 2017-07-23 MED ORDER — SODIUM CHLORIDE 0.9 % IV SOLN
80.0000 mg/m2 | Freq: Once | INTRAVENOUS | Status: AC
  Administered 2017-07-23: 138 mg via INTRAVENOUS
  Filled 2017-07-23: qty 23

## 2017-07-23 NOTE — Assessment & Plan Note (Addendum)
#  Stage I ER/PR/HER-2/neu negative right breast cancer-currently on neoadjuvant chemotherapy- ddAC- s/p 4 cycles; ammo/US- improved ~59m nodule. On cBotswanaq3/taxol weekly s/p cycle #3.   # Proceed with cycle #4 cGeronimo Boot4] q3w #3; taxol weekly; fatigue- CBC CMP are reviewed; adequate today; no contraindications to chemotherapy. Proceed with treatment today-mild anemia hemoglobin 10 stable.  # Headaches/ ? Allergies- stable.   No concerns for any metastatic disease to the brain.  Continue Claritin.  # s/p appt- Dr.Blackman;[facility]- given logistical issues; will refer to Dr.Byrnett. I will reach out to him.   #Patient will need also need bilateral salpingo-oophorectomy down the line.  # follow up 3 weeks-/MDcarbo-taxol; weekly taxol x2. Follow up  MD in 4 weeks/labs- ONLY; no chemo; refrral to Dr.Byrnett.   Addendum: I spoke to Dr. BBary Castilla who kindly agrees to evaluate the patient-discussed the surgical options.  Patient is interested in bilateral mastectomy [given BRCA positive]; and also immediate reconstruction; patient has previously met with Dr. DMarla Roe

## 2017-07-23 NOTE — Progress Notes (Signed)
[facility] NOTE  Patient Care Team: Patient, No Pcp Per as PCP - General (General Practice) Magrinat, Virgie Dad, MD as Consulting Physician (Oncology) Armandina Stammer, MD (Obstetrics and Gynecology) Dillingham, Loel Lofty, DO as Attending Physician (Plastic Surgery) Coralie Keens, MD as Consulting Physician (General Surgery) Cammie Sickle, MD as Consulting Physician (Internal Medicine)  CHIEF COMPLAINTS/PURPOSE OF CONSULTATION:  Breast cancer  #  Oncology History   # OCT 2018- RIGHT BREAST UIQ -[place]; TRIPLE NEGATIVE; ki-67-high [34m-1'O clock-Bx- proven; [place]]; another- 12'O clock-458m Not biopsied. [s/p Dr.Magrinaut; [place]]; cT1c(m) cN0;  G-3; ki-67- 80%  # 11/218- ddACesolved;  x4 cyles- US/ammo- improved 56m36mothe leison-r-Taxol+carbo  # BRCA-1 Gene mutated [mom-ovarian cancer-Stage IIIB/Brca; older sister- breast ca/brca-mutated]  # MUGA scan [nov 2018]- 67%; UPBEAT clinical trial     Malignant neoplasm of upper-inner quadrant of right breast in female, estrogen receptor negative ([facility]  HISTORY OF PRESENTING ILLNESS:  Holly Parker 57o.  female with a history of BRCA1 mutation; triple negative stage I breast cancer-currently on neoadjuvant chemotherapy status -currently on carboplatin every 3 weeks/Taxol weekly is here for follow-up.  She is currently status post 3 cycles.   Patient denies any unusual shortness of breath or a cough or fever.  She has intermittent sinus congestion/intermittent headaches-not completely resolved she is on Claritin.  Denies any nausea vomiting.  Denies the headache waking her up at night.   ROS: A complete 10 point review of system is done which is negative except mentioned above in history of present illness  MEDICAL HISTORY:  Past Medical History:  Diagnosis Date  . Asthma    Excercise induced  . Fibromyalgia   . Malignant neoplasm of upper-inner quadrant of right breast in female,  estrogen receptor negative ([id number]   right breast  . Personal history of chemotherapy    Right breast- 4 treatments    SURGICAL HISTORY: Past Surgical History:  Procedure Laterality Date  . IR FLUORO GUIDE PORT INSERTION LEFT  03/17/2017  . KNEE SURGERY Left    Torn meniscus   . 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 Marital status: Married    Spouse name: Not on file  . Number of children: Not on file  . Years of education: Not on file  . Highest education level: Not on file  Occupational History  . Not on file  Social Needs  . Financial resource strain: Not on file  . Food insecurity:    Worry: Not on file    Inability: Not on file  . Transportation needs:    Medical: Not on file    Non-medical: Not on file  Tobacco Use  . Smoking status: Former Smoker    Types: Cigarettes    Last attempt to quit: 09/16/2011    Years since quitting: 5.8  . Smokeless tobacco: Never Used  Substance and Sexual Activity  . Alcohol use: No  . Drug use: No  . Sexual activity: Yes  Lifestyle  . Physical activity:    Days per week: Not on file    Minutes per session: Not on file  . Stress: Not on file  Relationships  . Social connections:    Talks on phone: Not on file    Gets together: Not on file    Attends religious service: Not on file    Active member of  club or organization: Not on file    Attends meetings of clubs or organizations: Not on file    Relationship status: Not on file  . Intimate partner violence:    Fear of current or ex partner: Not on file    Emotionally abused: Not on file    Physically abused: Not on file    Forced sexual activity: Not on file  Other Topics Concern  . Not on file  Social History Narrative  . Not on file    FAMILY HISTORY: Family History  Problem Relation Age of Onset  . Cancer Mother        Ovarian cancer (2003), breast  cancer(2017) & squamous cell   . Leukemia Mother   . Breast cancer Mother 42  . Cancer Sister        breast cancer  . Breast cancer Sister 42  . Melanoma Father   . Lung cancer Paternal Grandmother     ALLERGIES:  has No Known Allergies.  MEDICATIONS:  Current Outpatient Medications  Medication Sig Dispense Refill  . acetaminophen (TYLENOL) 500 MG tablet Take 500 mg by mouth every 6 (six) hours as needed for moderate pain.    Marland Kitchen loratadine (CLARITIN) 10 MG tablet Take 10 mg daily by mouth.    . sertraline (ZOLOFT) 100 MG tablet Take 100 mg by mouth.    Marland Kitchen aspirin-acetaminophen-caffeine (EXCEDRIN MIGRAINE) 250-250-65 MG per tablet Take 1 tablet by mouth as needed for headache.    . cromolyn (NASALCROM) 5.2 MG/ACT nasal spray Place 1 spray into both nostrils 4 (four) times daily as needed for allergies or rhinitis. (Patient not taking: Reported on 07/03/2017) 26 mL 0  . DM-Phenylephrine-Acetaminophen (ROBITUSSIN COLD+FLU DAYTIME PO) Take 15 mLs by mouth every 6 (six) hours.    . lidocaine-prilocaine (EMLA) cream Apply 1 application as needed topically. Apply generously over the Mediport 45 minutes prior to chemotherapy. (Patient not taking: Reported on 07/23/2017) 30 g 0  . ondansetron (ZOFRAN) 8 MG tablet One pill every 8 hours as needed for nausea/vomitting. (Patient not taking: Reported on 07/03/2017) 40 tablet 1  . prochlorperazine (COMPAZINE) 10 MG tablet Take 1 tablet (10 mg total) every 6 (six) hours as needed by mouth for nausea or vomiting. (Patient not taking: Reported on 07/03/2017) 40 tablet 1  . Pseudoephedrine-Naproxen Na (ALEVE-D SINUS & HEADACHE PO) Take 1 each by mouth as needed.     No current facility-administered medications for this visit.       Marland Kitchen  PHYSICAL EXAMINATION: ECOG PERFORMANCE STATUS: 0 - Asymptomatic  Vitals:   07/23/17 1002  BP: 123/74  Pulse: 79  Resp: 18  Temp: (!) 97.2 F (36.2 C)   Filed Weights   07/23/17 1002  Weight: 143 lb (64.9 kg)     GENERAL: Well-nourished well-developed; Alert, no distress and comfortable.   She is alone. EYES: no pallor or icterus OROPHARYNX: no thrush or ulceration; good dentition  NECK: supple, no masses felt LYMPH:  no palpable lymphadenopathy in the cervical, axillary or inguinal regions LUNGS: clear to auscultation and  No wheeze or crackles HEART/CVS: regular rate & rhythm and no murmurs; No lower extremity edema ABDOMEN: abdomen soft, non-tender and normal bowel sounds Musculoskeletal:no cyanosis of digits and no clubbing  PSYCH: alert & oriented x 3 with fluent speech NEURO: no focal motor/sensory deficits SKIN:  no rashes or significant lesions;  LABORATORY DATA:  I have reviewed the data as listed Lab Results  Component Value Date   WBC 3.0 (  L) 07/23/2017   HGB 10.4 (L) 07/23/2017   HCT 29.9 (L) 07/23/2017   MCV 101.6 (H) 07/23/2017   PLT 143 (L) 07/23/2017   Recent Labs    07/09/17 1324 07/16/17 1320 07/23/17 0952  NA 135 136 136  K 4.0 3.5 4.0  CL 101 102 103  CO2 _0 GLUCOSE 105* 141* 117*  BUN _1 CREATININE 0.48 0.55 0.58  CALCIUM 8.9 9.4 9.1  GFRNONAA >60 >60 >60  GFRAA >60 >60 >60  PROT 7.1 7.6 7.2  ALBUMIN 3.9 4.0 4.0  AST 22 34 28  ALT _2 ALKPHOS 51 67 56  BILITOT 0.2* 0.5 0.4    RADIOGRAPHIC STUDIES: I have personally reviewed the radiological images as listed and agreed with the findings in the report. No results found.  ASSESSMENT & PLAN:   Malignant neoplasm of upper-inner quadrant of right breast in female, estrogen receptor negative ([place]) # Stage I ER/PR/HER-2/neu negative right breast cancer-currently on neoadjuvant chemotherapy- ddAC- s/p 4 cycles; ammo/US- improved ~52m nodule. On cBotswanaq3/taxol weekly s/p cycle #3.   # Proceed with cycle #4 cGeronimo Boot4] q3w #3; taxol weekly; fatigue- CBC CMP are reviewed; adequate today; no contraindications to chemotherapy. Proceed with treatment today-mild anemia hemoglobin 10  stable.  # Headaches/ ? Allergies- stable.   No concerns for any metastatic disease to the brain.  Continue Claritin.  # s/p appt- Dr.Blackman;[facility]- given logistical issues; will refer to Dr.Byrnett. I will reach out to him.   #Patient will need also need bilateral salpingo-oophorectomy down the line.  # follow up 3 weeks-/MDcarbo-taxol; weekly taxol x2. Follow up  MD in 4 weeks/labs- ONLY; no chemo; refrral to Dr.Byrnett.   Addendum: I spoke to Dr. BBary Castilla who kindly agrees to evaluate the patient-discussed the surgical options.  Patient is interested in bilateral mastectomy [given BRCA positive]; and also immediate reconstruction; patient has previously met with Dr. DMarla Roe   All questions were answered. The patient knows to call the clinic with any problems, questions or concerns.    GCammie Sickle MD 07/27/2017 9:01 AM

## 2017-07-29 ENCOUNTER — Ambulatory Visit: Payer: Self-pay | Admitting: General Surgery

## 2017-07-30 ENCOUNTER — Inpatient Hospital Stay

## 2017-07-30 ENCOUNTER — Other Ambulatory Visit: Payer: Self-pay | Admitting: Internal Medicine

## 2017-07-30 ENCOUNTER — Inpatient Hospital Stay: Attending: Internal Medicine

## 2017-07-30 VITALS — BP 101/78 | HR 82 | Temp 98.1°F | Resp 18 | Wt 142.2 lb

## 2017-07-30 DIAGNOSIS — Z171 Estrogen receptor negative status [ER-]: Secondary | ICD-10-CM

## 2017-07-30 DIAGNOSIS — C50211 Malignant neoplasm of upper-inner quadrant of right female breast: Secondary | ICD-10-CM | POA: Insufficient documentation

## 2017-07-30 DIAGNOSIS — Z5111 Encounter for antineoplastic chemotherapy: Secondary | ICD-10-CM | POA: Diagnosis not present

## 2017-07-30 LAB — COMPREHENSIVE METABOLIC PANEL
ALBUMIN: 4 g/dL (ref 3.5–5.0)
ALT: 28 U/L (ref 14–54)
AST: 31 U/L (ref 15–41)
Alkaline Phosphatase: 57 U/L (ref 38–126)
Anion gap: 9 (ref 5–15)
BILIRUBIN TOTAL: 0.7 mg/dL (ref 0.3–1.2)
BUN: 16 mg/dL (ref 6–20)
CO2: 24 mmol/L (ref 22–32)
CREATININE: 0.51 mg/dL (ref 0.44–1.00)
Calcium: 9.2 mg/dL (ref 8.9–10.3)
Chloride: 102 mmol/L (ref 101–111)
GFR calc Af Amer: >60 mL/min (ref >60)
GFR calc non Af Amer: >60 mL/min (ref >60)
GLUCOSE: 119 mg/dL — AB (ref 65–99)
POTASSIUM: 4 mmol/L (ref 3.5–5.1)
Sodium: 135 mmol/L (ref 135–145)
TOTAL PROTEIN: 7.1 g/dL (ref 6.5–8.1)

## 2017-07-30 LAB — CBC WITH DIFFERENTIAL/PLATELET
BASOS ABS: 0 10*3/uL (ref 0–0.1)
BASOS PCT: 1 %
EOS ABS: 0 10*3/uL (ref 0–0.7)
Eosinophils Relative: 1 %
HCT: 28.8 % — ABNORMAL LOW (ref 35.0–47.0)
HEMOGLOBIN: 10.1 g/dL — AB (ref 12.0–16.0)
Lymphocytes Relative: 26 %
Lymphs Abs: 0.9 10*3/uL — ABNORMAL LOW (ref 1.0–3.6)
MCH: 35 pg — ABNORMAL HIGH (ref 26.0–34.0)
MCHC: 35 g/dL (ref 32.0–36.0)
MCV: 100.1 fL — ABNORMAL HIGH (ref 80.0–100.0)
Monocytes Absolute: 0.2 10*3/uL (ref 0.2–0.9)
Monocytes Relative: 7 %
NEUTROS PCT: 65 %
Neutro Abs: 2.1 10*3/uL (ref 1.4–6.5)
Platelets: 124 10*3/uL — ABNORMAL LOW (ref 150–440)
RBC: 2.87 MIL/uL — ABNORMAL LOW (ref 3.80–5.20)
RDW: 15.6 % — AB (ref 11.5–14.5)
WBC: 3.3 10*3/uL — ABNORMAL LOW (ref 3.6–11.0)

## 2017-07-30 MED ORDER — SODIUM CHLORIDE 0.9 % IV SOLN
20.0000 mg | Freq: Once | INTRAVENOUS | Status: AC
  Administered 2017-07-30: 20 mg via INTRAVENOUS
  Filled 2017-07-30: qty 2

## 2017-07-30 MED ORDER — HEPARIN SOD (PORK) LOCK FLUSH 100 UNIT/ML IV SOLN
500.0000 [IU] | Freq: Once | INTRAVENOUS | Status: AC | PRN
  Administered 2017-07-30: 500 [IU]
  Filled 2017-07-30: qty 5

## 2017-07-30 MED ORDER — FAMOTIDINE IN NACL 20-0.9 MG/50ML-% IV SOLN: 20.0000 mg | Freq: Once | INTRAVENOUS | Status: DC

## 2017-07-30 MED ORDER — SODIUM CHLORIDE 0.9 % IV SOLN
Freq: Once | INTRAVENOUS | Status: AC
  Administered 2017-07-30: 11:00:00 via INTRAVENOUS
  Filled 2017-07-30: qty 100

## 2017-07-30 MED ORDER — SODIUM CHLORIDE 0.9 % IV SOLN
Freq: Once | INTRAVENOUS | Status: AC
  Administered 2017-07-30: 11:00:00 via INTRAVENOUS
  Filled 2017-07-30: qty 1000

## 2017-07-30 MED ORDER — SODIUM CHLORIDE 0.9 % IV SOLN
80.0000 mg/m2 | Freq: Once | INTRAVENOUS | Status: AC
  Administered 2017-07-30: 138 mg via INTRAVENOUS
  Filled 2017-07-30: qty 23

## 2017-07-30 MED ORDER — DIPHENHYDRAMINE HCL 50 MG/ML IJ SOLN
50.0000 mg | Freq: Once | INTRAMUSCULAR | Status: AC
  Administered 2017-07-30: 50 mg via INTRAVENOUS
  Filled 2017-07-30: qty 1

## 2017-08-06 ENCOUNTER — Inpatient Hospital Stay

## 2017-08-06 VITALS — BP 113/78 | HR 79 | Temp 98.1°F | Resp 18 | Wt 143.0 lb

## 2017-08-06 DIAGNOSIS — Z171 Estrogen receptor negative status [ER-]: Principal | ICD-10-CM

## 2017-08-06 DIAGNOSIS — Z5111 Encounter for antineoplastic chemotherapy: Secondary | ICD-10-CM | POA: Diagnosis not present

## 2017-08-06 DIAGNOSIS — C50211 Malignant neoplasm of upper-inner quadrant of right female breast: Secondary | ICD-10-CM

## 2017-08-06 LAB — COMPREHENSIVE METABOLIC PANEL
ALBUMIN: 4 g/dL (ref 3.5–5.0)
ALT: 21 U/L (ref 14–54)
ANION GAP: 7 (ref 5–15)
AST: 20 U/L (ref 15–41)
Alkaline Phosphatase: 56 U/L (ref 38–126)
BILIRUBIN TOTAL: 0.7 mg/dL (ref 0.3–1.2)
BUN: 15 mg/dL (ref 6–20)
CO2: 24 mmol/L (ref 22–32)
Calcium: 9.2 mg/dL (ref 8.9–10.3)
Chloride: 102 mmol/L (ref 101–111)
Creatinine, Ser: 0.57 mg/dL (ref 0.44–1.00)
GFR calc Af Amer: >60 mL/min (ref >60)
GFR calc non Af Amer: >60 mL/min (ref >60)
GLUCOSE: 95 mg/dL (ref 65–99)
POTASSIUM: 4.1 mmol/L (ref 3.5–5.1)
SODIUM: 133 mmol/L — AB (ref 135–145)
TOTAL PROTEIN: 7.2 g/dL (ref 6.5–8.1)

## 2017-08-06 LAB — CBC WITH DIFFERENTIAL/PLATELET
Basophils Absolute: 0 10*3/uL (ref 0–0.1)
Basophils Relative: 1 %
Eosinophils Absolute: 0.1 10*3/uL (ref 0–0.7)
Eosinophils Relative: 3 %
HEMATOCRIT: 27.3 % — AB (ref 35.0–47.0)
HEMOGLOBIN: 9.6 g/dL — AB (ref 12.0–16.0)
LYMPHS PCT: 38 %
Lymphs Abs: 0.9 10*3/uL — ABNORMAL LOW (ref 1.0–3.6)
MCH: 35.4 pg — ABNORMAL HIGH (ref 26.0–34.0)
MCHC: 35.2 g/dL (ref 32.0–36.0)
MCV: 100.3 fL — AB (ref 80.0–100.0)
MONO ABS: 0.4 10*3/uL (ref 0.2–0.9)
Monocytes Relative: 19 %
NEUTROS ABS: 0.9 10*3/uL — AB (ref 1.4–6.5)
Neutrophils Relative %: 39 %
Platelets: 249 10*3/uL (ref 150–440)
RBC: 2.72 MIL/uL — AB (ref 3.80–5.20)
RDW: 15.9 % — AB (ref 11.5–14.5)
WBC: 2.4 10*3/uL — ABNORMAL LOW (ref 3.6–11.0)

## 2017-08-06 MED ORDER — HEPARIN SOD (PORK) LOCK FLUSH 100 UNIT/ML IV SOLN
500.0000 [IU] | Freq: Once | INTRAVENOUS | Status: AC
  Administered 2017-08-06: 500 [IU] via INTRAVENOUS

## 2017-08-06 MED ORDER — SODIUM CHLORIDE 0.9 % IV SOLN
80.0000 mg/m2 | Freq: Once | INTRAVENOUS | Status: AC
  Administered 2017-08-06: 138 mg via INTRAVENOUS
  Filled 2017-08-06: qty 23

## 2017-08-06 MED ORDER — SODIUM CHLORIDE 0.9 % IV SOLN
20.0000 mg | Freq: Once | INTRAVENOUS | Status: AC
  Administered 2017-08-06: 20 mg via INTRAVENOUS
  Filled 2017-08-06: qty 2

## 2017-08-06 MED ORDER — SODIUM CHLORIDE 0.9 % IV SOLN
Freq: Once | INTRAVENOUS | Status: AC
  Administered 2017-08-06: 11:00:00 via INTRAVENOUS
  Filled 2017-08-06: qty 1000

## 2017-08-06 MED ORDER — FAMOTIDINE IN NACL 20-0.9 MG/50ML-% IV SOLN: 20.0000 mg | Freq: Once | INTRAVENOUS | Status: DC

## 2017-08-06 MED ORDER — FAMOTIDINE 200 MG/20ML IV SOLN
INTRAVENOUS | Status: DC
  Administered 2017-08-06: 11:00:00 via INTRAVENOUS
  Filled 2017-08-06 (×2): qty 100

## 2017-08-06 MED ORDER — DIPHENHYDRAMINE HCL 50 MG/ML IJ SOLN
50.0000 mg | Freq: Once | INTRAMUSCULAR | Status: AC
  Administered 2017-08-06: 50 mg via INTRAVENOUS
  Filled 2017-08-06: qty 1

## 2017-08-06 NOTE — Progress Notes (Signed)
Dr. Rogue Bussing is aware of ANC 0.9.  Gave order to proceed with today's treatment.

## 2017-08-08 ENCOUNTER — Ambulatory Visit: Payer: Self-pay | Admitting: General Surgery

## 2017-08-08 DIAGNOSIS — Z171 Estrogen receptor negative status [ER-]: Principal | ICD-10-CM

## 2017-08-08 DIAGNOSIS — C50211 Malignant neoplasm of upper-inner quadrant of right female breast: Secondary | ICD-10-CM

## 2017-08-12 ENCOUNTER — Other Ambulatory Visit: Payer: Self-pay | Admitting: General Surgery

## 2017-08-12 DIAGNOSIS — C50911 Malignant neoplasm of unspecified site of right female breast: Secondary | ICD-10-CM

## 2017-08-20 ENCOUNTER — Other Ambulatory Visit: Payer: Self-pay

## 2017-08-20 ENCOUNTER — Inpatient Hospital Stay

## 2017-08-20 ENCOUNTER — Inpatient Hospital Stay (HOSPITAL_BASED_OUTPATIENT_CLINIC_OR_DEPARTMENT_OTHER): Admitting: Internal Medicine

## 2017-08-20 ENCOUNTER — Encounter: Payer: Self-pay | Admitting: Internal Medicine

## 2017-08-20 VITALS — BP 113/72 | HR 90 | Temp 97.9°F | Resp 18 | Ht 67.0 in | Wt 144.0 lb

## 2017-08-20 DIAGNOSIS — Z171 Estrogen receptor negative status [ER-]: Principal | ICD-10-CM

## 2017-08-20 DIAGNOSIS — C50211 Malignant neoplasm of upper-inner quadrant of right female breast: Secondary | ICD-10-CM

## 2017-08-20 DIAGNOSIS — Z5111 Encounter for antineoplastic chemotherapy: Secondary | ICD-10-CM | POA: Diagnosis not present

## 2017-08-20 LAB — CBC WITH DIFFERENTIAL/PLATELET
BASOS ABS: 0 10*3/uL (ref 0–0.1)
BASOS PCT: 1 %
Eosinophils Absolute: 0.1 10*3/uL (ref 0–0.7)
Eosinophils Relative: 2 %
HEMATOCRIT: 32.4 % — AB (ref 35.0–47.0)
HEMOGLOBIN: 11.3 g/dL — AB (ref 12.0–16.0)
LYMPHS PCT: 26 %
Lymphs Abs: 1.1 10*3/uL (ref 1.0–3.6)
MCH: 35.6 pg — ABNORMAL HIGH (ref 26.0–34.0)
MCHC: 34.9 g/dL (ref 32.0–36.0)
MCV: 101.9 fL — AB (ref 80.0–100.0)
Monocytes Absolute: 0.6 10*3/uL (ref 0.2–0.9)
Monocytes Relative: 15 %
NEUTROS ABS: 2.4 10*3/uL (ref 1.4–6.5)
NEUTROS PCT: 56 %
Platelets: 199 10*3/uL (ref 150–440)
RBC: 3.18 MIL/uL — AB (ref 3.80–5.20)
RDW: 16.2 % — ABNORMAL HIGH (ref 11.5–14.5)
WBC: 4.2 10*3/uL (ref 3.6–11.0)

## 2017-08-20 LAB — COMPREHENSIVE METABOLIC PANEL
ALT: 18 U/L (ref 14–54)
ANION GAP: 8 (ref 5–15)
AST: 21 U/L (ref 15–41)
Albumin: 4.3 g/dL (ref 3.5–5.0)
Alkaline Phosphatase: 60 U/L (ref 38–126)
BILIRUBIN TOTAL: 0.5 mg/dL (ref 0.3–1.2)
BUN: 13 mg/dL (ref 6–20)
CO2: 25 mmol/L (ref 22–32)
Calcium: 9.5 mg/dL (ref 8.9–10.3)
Chloride: 103 mmol/L (ref 101–111)
Creatinine, Ser: 0.68 mg/dL (ref 0.44–1.00)
GFR calc non Af Amer: >60 mL/min (ref >60)
GLUCOSE: 68 mg/dL (ref 65–99)
POTASSIUM: 4 mmol/L (ref 3.5–5.1)
SODIUM: 136 mmol/L (ref 135–145)
TOTAL PROTEIN: 7.4 g/dL (ref 6.5–8.1)

## 2017-08-20 NOTE — Progress Notes (Signed)
[facility] [place] NOTE  Patient Care Team: Patient, No Pcp Per as PCP - General (General Practice) Magrinat, Virgie Dad, MD as Consulting Physician (Oncology) Armandina Stammer, MD (Obstetrics and Gynecology) Dillingham, Loel Lofty, DO as Attending Physician (Plastic Surgery) Coralie Keens, MD as Consulting Physician (General Surgery) Cammie Sickle, MD as Consulting Physician (Internal Medicine)  CHIEF COMPLAINTS/PURPOSE OF CONSULTATION:  Breast cancer  #  Oncology History   # OCT 2018- RIGHT BREAST UIQ -[place]; TRIPLE NEGATIVE; ki-67-high [60m-1'O clock-Bx- proven; [place]]; another- 12'O clock-4262m Not biopsied. [s/p Dr.Magrinaut; [place]]; cT1c(m) cN0;  G-3; ki-67- 80%  # 11/218- ddACesolved;  x4 cyles- US/ammo- improved 62m83mothe leison-r-Taxol+carbo [finished [place] April 2019]  # BRCA-1 Gene mutated [mom-ovarian cancer-Stage IIIB/Brca; older sister- breast ca/brca-mutated]  # MUGA scan [nov 2015498]7%; UPBEAT clinical trial     Malignant neoplasm of upper-inner quadrant of right breast in female, estrogen receptor negative ([facility]  HISTORY OF PRESENTING ILLNESS:  Holly Parker 72o.  female with a history of BRCA1 mutation; triple negative stage I breast cancer-currently on neoadjuvant chemotherapy status -currently on carboplatin every 3 weeks/Taxol weekly is here for follow-up.  She is currently status post 4 cycles;   Finished approximately 3 weeks ago.  Patient interim has been evaluated by Dr.Toth [facility]/; and also seen by plastic surgery.  She is awaiting to have an MRI of the breast-to help make decisions regarding mastectomy/and plan to have a reconstruction of the same time  Patient denies any unusual shortness of breath or a cough or fever. Denies any nausea vomiting.  Intermittent allergies.  Overall improved.  ROS: A complete 10 point review of system is done which is negative except mentioned above in history of present  illness  MEDICAL HISTORY:  Past Medical History:  Diagnosis Date  . Asthma    Excercise induced  . Fibromyalgia   . Malignant neoplasm of upper-inner quadrant of right breast in female, estrogen receptor negative ([id number]   right breast  . Personal history of chemotherapy    Right breast- 4 treatments    SURGICAL HISTORY: Past Surgical History:  Procedure Laterality Date  . IR FLUORO GUIDE PORT INSERTION LEFT  03/17/2017  . KNEE SURGERY Left    Torn meniscus   . 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 Marital status: Married    Spouse name: Not on file  . Number of children: Not on file  . Years of education: Not on file  . Highest education level: Not on file  Occupational History  . Not on file  Social Needs  . Financial resource strain: Not on file  . Food insecurity:    Worry: Not on file    Inability: Not on file  . Transportation needs:    Medical: Not on file    Non-medical: Not on file  Tobacco Use  . Smoking status: Former Smoker    Types: Cigarettes    Last attempt to quit: 09/16/2011    Years since quitting: 5.9  . Smokeless tobacco: Never Used  Substance and Sexual Activity  . Alcohol use: No  . Drug use: No  . Sexual activity: Yes  Lifestyle  . Physical activity:    Days per week: Not on file    Minutes per session: Not on file  . Stress: Not on file  Relationships  . Social  connections:    Talks on phone: Not on file    Gets together: Not on file    Attends religious service: Not on file    Active member of club or organization: Not on file    Attends meetings of clubs or organizations: Not on file    Relationship status: Not on file  . Intimate partner violence:    Fear of current or ex partner: Not on file    Emotionally abused: Not on file    Physically abused: Not on file    Forced sexual activity: Not on file  Other Topics  Concern  . Not on file  Social History Narrative  . Not on file    FAMILY HISTORY: Family History  Problem Relation Age of Onset  . Cancer Mother        Ovarian cancer (2003), breast cancer(2017) & squamous cell   . Leukemia Mother   . Breast cancer Mother 6  . Cancer Sister        breast cancer  . Breast cancer Sister 66  . Melanoma Father   . Lung cancer Paternal Grandmother     ALLERGIES:  has No Known Allergies.  MEDICATIONS:  Current Outpatient Medications  Medication Sig Dispense Refill  . acetaminophen (TYLENOL) 500 MG tablet Take 500 mg by mouth every 6 (six) hours as needed for moderate pain.    Marland Kitchen loratadine (CLARITIN) 10 MG tablet Take 10 mg daily by mouth.    . sertraline (ZOLOFT) 100 MG tablet Take 100 mg by mouth.    Marland Kitchen aspirin-acetaminophen-caffeine (EXCEDRIN MIGRAINE) 250-250-65 MG per tablet Take 1 tablet by mouth as needed for headache.    . cromolyn (NASALCROM) 5.2 MG/ACT nasal spray Place 1 spray into both nostrils 4 (four) times daily as needed for allergies or rhinitis. (Patient not taking: Reported on 07/03/2017) 26 mL 0  . DM-Phenylephrine-Acetaminophen (ROBITUSSIN COLD+FLU DAYTIME PO) Take 15 mLs by mouth every 6 (six) hours.    . lidocaine-prilocaine (EMLA) cream Apply 1 application as needed topically. Apply generously over the Mediport 45 minutes prior to chemotherapy. (Patient not taking: Reported on 07/23/2017) 30 g 0  . ondansetron (ZOFRAN) 8 MG tablet One pill every 8 hours as needed for nausea/vomitting. (Patient not taking: Reported on 07/03/2017) 40 tablet 1  . prochlorperazine (COMPAZINE) 10 MG tablet Take 1 tablet (10 mg total) every 6 (six) hours as needed by mouth for nausea or vomiting. (Patient not taking: Reported on 07/03/2017) 40 tablet 1  . Pseudoephedrine-Naproxen Na (ALEVE-D SINUS & HEADACHE PO) Take 1 each by mouth as needed.     No current facility-administered medications for this visit.       Marland Kitchen  PHYSICAL EXAMINATION: ECOG  PERFORMANCE STATUS: 0 - Asymptomatic  Vitals:   08/20/17 1014  BP: 113/72  Pulse: 90  Resp: 18  Temp: 97.9 F (36.6 C)   Filed Weights   08/20/17 1014  Weight: 144 lb (65.3 kg)    GENERAL: Well-nourished well-developed; Alert, no distress and comfortable.   She is alone. EYES: no pallor or icterus OROPHARYNX: no thrush or ulceration; good dentition  NECK: supple, no masses felt LYMPH:  no palpable lymphadenopathy in the cervical, axillary or inguinal regions LUNGS: clear to auscultation and  No wheeze or crackles HEART/CVS: regular rate & rhythm and no murmurs; No lower extremity edema ABDOMEN: abdomen soft, non-tender and normal bowel sounds Musculoskeletal:no cyanosis of digits and no clubbing  PSYCH: alert & oriented x 3 with fluent speech NEURO:  no focal motor/sensory deficits SKIN:  no rashes or significant lesions;  LABORATORY DATA:  I have reviewed the data as listed Lab Results  Component Value Date   WBC 4.2 08/20/2017   HGB 11.3 (L) 08/20/2017   HCT 32.4 (L) 08/20/2017   MCV 101.9 (H) 08/20/2017   PLT 199 08/20/2017   Recent Labs    07/30/17 1002 08/06/17 1004 08/20/17 0954  NA 135 133* 136  K 4.0 4.1 4.0  CL 102 102 103  CO2 _0 GLUCOSE 119* 95 68  BUN _1 CREATININE 0.51 0.57 0.68  CALCIUM 9.2 9.2 9.5  GFRNONAA >60 >60 >60  GFRAA >60 >60 >60  PROT 7.1 7.2 7.4  ALBUMIN 4.0 4.0 4.3  AST _2 ALT _3 ALKPHOS 57 56 60  BILITOT 0.7 0.7 0.5    RADIOGRAPHIC STUDIES: I have personally reviewed the radiological images as listed and agreed with the findings in the report. No results found.  ASSESSMENT & PLAN:   Malignant neoplasm of upper-inner quadrant of right breast in female, estrogen receptor negative ([place]) # Stage I ER/PR/HER-2/neu negative right breast cancer.  Clinically she had a good response.  Patient currently s/p cycle #4 Geronimo Boot 4] q3w #3; taxol weekly; currently status post 4 cycles approximately 3 weeks  ago.   #Surgery plan on May 20 with Dr. Ralene Muskrat for bilateral mastectomy ; awaiting MRI of the breast next week for further surgical planning.   #Allergies overall improved.  Continue antihistamines as planned  # s/p appt- Dr.Toth;[facility]. Will await MRI re: port explanation.  #Patient will need also need bilateral salpingo-oophorectomy down the line. She will check with surgery if this would be feasible with her mastectomy surgery.   # follow up in mid June/2019; no labs.  [MRI breast cannot be done at [facility]]  All questions were answered. The patient knows to call the clinic with any problems, questions or concerns.    Cammie Sickle, MD 08/20/2017 11:55 AM

## 2017-08-20 NOTE — Assessment & Plan Note (Addendum)
#  Stage I ER/PR/HER-2/neu negative right breast cancer.  Clinically she had a good response.  Patient currently s/p cycle #4 Geronimo Boot 4] q3w #3; taxol weekly; currently status post 4 cycles approximately 3 weeks ago.   #Surgery plan on May 20 with Dr. Ralene Muskrat for bilateral mastectomy ; awaiting MRI of the breast next week for further surgical planning.   #Allergies overall improved.  Continue antihistamines as planned  # s/p appt- Dr.Toth;[facility]. Will await MRI re: port explanation.  #Patient will need also need bilateral salpingo-oophorectomy down the line. She will check with surgery if this would be feasible with her mastectomy surgery.   # follow up in mid June/2019; no labs.  [MRI breast cannot be done at [facility]]

## 2017-08-26 ENCOUNTER — Ambulatory Visit (HOSPITAL_COMMUNITY)
Admission: RE | Admit: 2017-08-26 | Discharge: 2017-08-26 | Disposition: A | Discharge status: Home / Self Care | Source: Ambulatory Visit | Attending: General Surgery | Admitting: General Surgery

## 2017-08-26 DIAGNOSIS — C50211 Malignant neoplasm of upper-inner quadrant of right female breast: Secondary | ICD-10-CM | POA: Diagnosis not present

## 2017-08-26 DIAGNOSIS — C50911 Malignant neoplasm of unspecified site of right female breast: Secondary | ICD-10-CM

## 2017-08-26 DIAGNOSIS — D0591 Unspecified type of carcinoma in situ of right breast: Secondary | ICD-10-CM | POA: Diagnosis not present

## 2017-08-26 MED ORDER — GADOBENATE DIMEGLUMINE 529 MG/ML IV SOLN
15.0000 mL | Freq: Once | INTRAVENOUS | Status: AC | PRN
  Administered 2017-08-26: 13 mL via INTRAVENOUS

## 2017-08-29 ENCOUNTER — Ambulatory Visit: Payer: Self-pay | Admitting: Plastic Surgery

## 2017-08-29 DIAGNOSIS — C50919 Malignant neoplasm of unspecified site of unspecified female breast: Secondary | ICD-10-CM

## 2017-09-08 ENCOUNTER — Encounter
Admission: RE | Admit: 2017-09-08 | Discharge: 2017-09-08 | Disposition: A | Discharge status: Home / Self Care | Source: Ambulatory Visit | Attending: General Surgery | Admitting: General Surgery

## 2017-09-08 ENCOUNTER — Other Ambulatory Visit: Payer: Self-pay

## 2017-09-08 DIAGNOSIS — Z01818 Encounter for other preprocedural examination: Secondary | ICD-10-CM | POA: Insufficient documentation

## 2017-09-08 DIAGNOSIS — Z01810 Encounter for preprocedural cardiovascular examination: Secondary | ICD-10-CM

## 2017-09-08 HISTORY — DX: Headache, unspecified: R51.9

## 2017-09-08 HISTORY — DX: Anxiety disorder, unspecified: F41.9

## 2017-09-08 HISTORY — DX: Anemia, unspecified: D64.9

## 2017-09-08 HISTORY — DX: Headache: R51

## 2017-09-08 NOTE — Patient Instructions (Addendum)
Your procedure is scheduled on: Monday, MAY 20th  Report to the second floor of the [facility]. DO NOT STOP ON THE FIRST FLOOR TO REGISTER  To find out your arrival time please call [phone] between 1PM - 3PM on Friday, MAY 17th  Remember: Instructions that are not followed completely may result in serious medical risk, p to and including death, or upon the discretion of your surgeon and anesthesiologist your  surgery may need to be rescheduled.     _X__ 1. Do not eat food after midnight the night before your procedure.                 No gum chewing or hard candies.                  You may drink clear liquids up to 2 hours                 before you are scheduled to arrive for your surgery- DO not drink clear                 liquids within 2 hours of the start of your surgery.                  Clear Liquids include:  water, apple juice without pulp, clear carbohydrate                 drink such as Clearfast of Gatorade, Black Coffee or Tea (Do not add                 anything to coffee or tea).  __X__2.  On the morning of surgery brush your teeth with toothpaste and water,                  you may rinse your mouth with mouthwash if you wish.                   Do not swallow any toothpaste of mouthwash.     _X__ 3.  No Alcohol for 24 hours before or after surgery.   _X__ 4.  Do Not Smoke or use e-cigarettes For 24 Hours Prior to Your Surgery.                 Do not use any chewable tobacco products for at least 6 hours prior to                 surgery.  ____  5.  Bring all medications with you on the day of surgery if instructed.   __X__  6.  Notify your doctor if there is any change in your medical condition      (cold, fever, infections).     Do not wear jewelry, make-up, hairpins, clips or nail polish. Do not wear lotions, powders, or perfumes. You may wear deodorant. Do not shave 48 hours prior to surgery. Men may shave face and neck. Do not  bring valuables to the hospital.    The Surgery Center is not responsible for any belongings or valuables.  Contacts, dentures or bridgework may not be worn into surgery. Leave your suitcase in the car. After surgery it may be brought to your room. For patients admitted to the hospital, discharge time is determined by your treatment team.   Patients discharged the day of surgery will not be allowed to drive home.   Please read over the following fact sheets that you were given:  PREPARING FOR SURGERY   __X__ Take these medicines the morning of surgery with A SIP OF WATER:    1. FLONASE  2. CLARITIN  3. ZOLOFT  4.   5.  6.  ____ Fleet Enema (as directed)   _X___ Use CHG Soap as directed  ____ Use inhalers on the day of surgery  _x___ Stop ALL ASPIRIN PRODUCTS TODAY!!             This includes excedrin / bc powder / goodies powder  _x___ Stop Anti-inflammatories TODAY!!             This includes ibuprofen / motrin / advil / aleve / naprosyn / ibuprofen sinus   ____ Stop supplements until after surgery.    ____ Bring C-Pap to the hospital.   WEAR LOOSE FITTING AND COMFORTABLE SHIRT.

## 2017-09-13 ENCOUNTER — Ambulatory Visit: Payer: Self-pay | Admitting: Plastic Surgery

## 2017-09-13 DIAGNOSIS — C50919 Malignant neoplasm of unspecified site of unspecified female breast: Secondary | ICD-10-CM

## 2017-09-13 NOTE — H&P (Signed)
Holly Parker is an 37 y.o. female.   Chief Complaint: breast cancer HPI: The patient is a 37 yrs old wf here for pre operative history and physical prior to breast reconstruction.  Her sister and mother have both been treated here for breast cancer.  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  There has been no change in her breasts. She has finished chemo.  She is 5 feet 6 inches tall, wt = 144 pounds.  Her preop bra is 38 A.  She is married and has 3 kids.  MRI 08/26/2017 showed: 1. 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 No enhancing mass, non-mass enhancement or secondary signs of malignancy. However, a smaller residual mass was demonstrated on the earlier ultrasound of 05/20/2017. 2. Second smaller mass demonstrated within the subareolar RIGHT breast on initial mammogram and ultrasound of 02/17/2017 is not seen on today's MRI, also not seen on mammogram or ultrasound of 05/20/2017. 3. No evidence of malignancy within the LEFT breast.  She may be a candidate for bilateral NSM per this last MRI.   Past Medical History:  Diagnosis Date  . Anemia    iron deficiency during chemotherapy  . Anxiety   . Asthma    Excercise induced as a child  . Fibromyalgia   . Headache    migraine headaches due to sinus/allergies and teeth grinding at night  . Malignant neoplasm of upper-inner quadrant of right breast in female, estrogen receptor negative ([place]) 2018   right breast  . Personal history of chemotherapy    Right breast- 4 treatments    Past Surgical History:  Procedure Laterality Date  . IR FLUORO GUIDE PORT INSERTION LEFT  03/17/2017  . KNEE SURGERY Left 2009   Torn meniscus   . RHINOPLASTY  [id number]   both surgeries due to broken nose    Family History  Problem Relation Age of Onset  . Cancer Mother         Ovarian cancer (2003), breast cancer(2017) & squamous cell   . Leukemia Mother   . Breast cancer Mother 82  . Cancer Sister        breast cancer  . Breast cancer Sister 46  . Melanoma Father   . Lung cancer Paternal Grandmother    Social History:  reports that she quit smoking about 5 years ago. Her smoking use included cigarettes. She smoked 0.25 packs per day. She has never used smokeless tobacco. She reports that she does not drink alcohol or use drugs.  Allergies:  Allergies  Allergen Reactions  . Seasonal Ic [Cholestatin] Other (See Comments)    Headaches and sinus infections, runny nose    No medications prior to admission.    No results found for this or any previous visit (from the past 48 hour(s)). No results found.  Review of Systems  Constitutional: Negative.   HENT: Negative.   Eyes: Negative.   Respiratory: Negative.   Cardiovascular: Negative.   Gastrointestinal: Negative.   Genitourinary: Negative.   Musculoskeletal: Negative.   Skin: Negative.   Neurological: Negative.   Psychiatric/Behavioral: Negative.     There were no vitals taken for this visit. Physical Exam  Constitutional: She is oriented to person, place, and time. She appears well-developed and well-nourished.  HENT:  Head: Normocephalic and atraumatic.  Eyes: Pupils are equal, round, and reactive to light.  EOM are normal.  Cardiovascular: Normal rate.  Respiratory: Effort normal. No respiratory distress.  GI: Soft. She exhibits no distension.  Musculoskeletal: Normal range of motion. She exhibits no edema or tenderness.  Neurological: She is alert and oriented to person, place, and time.  Skin: Skin is warm. No rash noted. No erythema.  Psychiatric: She has a normal mood and affect. Her behavior is normal. Judgment and thought content normal.     Assessment/Plan Plan for Immediate bilateral breast reconstruction with tissue expander and dermal matrix.   [place],  DO 09/13/2017, 5:11 PM

## 2017-09-14 MED ORDER — CEFAZOLIN SODIUM-DEXTROSE 2-4 GM/100ML-% IV SOLN
2.0000 g | INTRAVENOUS | Status: AC
  Administered 2017-09-15 (×2): 2 g via INTRAVENOUS

## 2017-09-15 ENCOUNTER — Other Ambulatory Visit: Payer: Self-pay

## 2017-09-15 ENCOUNTER — Encounter
Admission: RE | Disposition: A | Discharge status: Home / Self Care | Payer: Self-pay | Source: Ambulatory Visit | Attending: Plastic Surgery

## 2017-09-15 ENCOUNTER — Encounter: Payer: Self-pay | Admitting: Plastic Surgery

## 2017-09-15 ENCOUNTER — Ambulatory Visit: Admitting: Anesthesiology

## 2017-09-15 ENCOUNTER — Encounter: Payer: Self-pay | Admitting: *Deleted

## 2017-09-15 ENCOUNTER — Observation Stay
Admission: RE | Admit: 2017-09-15 | Discharge: 2017-09-16 | Disposition: A | Discharge status: Home / Self Care | Source: Ambulatory Visit | Attending: Plastic Surgery | Admitting: Plastic Surgery

## 2017-09-15 ENCOUNTER — Ambulatory Visit
Admission: RE | Admit: 2017-09-15 | Discharge: 2017-09-15 | Disposition: A | Discharge status: Home / Self Care | Source: Ambulatory Visit | Attending: General Surgery | Admitting: General Surgery

## 2017-09-15 DIAGNOSIS — Z79899 Other long term (current) drug therapy: Secondary | ICD-10-CM | POA: Diagnosis not present

## 2017-09-15 DIAGNOSIS — Z803 Family history of malignant neoplasm of breast: Secondary | ICD-10-CM | POA: Diagnosis not present

## 2017-09-15 DIAGNOSIS — Z888 Allergy status to other drugs, medicaments and biological substances status: Secondary | ICD-10-CM | POA: Diagnosis not present

## 2017-09-15 DIAGNOSIS — Z8041 Family history of malignant neoplasm of ovary: Secondary | ICD-10-CM | POA: Insufficient documentation

## 2017-09-15 DIAGNOSIS — Z171 Estrogen receptor negative status [ER-]: Secondary | ICD-10-CM | POA: Diagnosis not present

## 2017-09-15 DIAGNOSIS — Z87891 Personal history of nicotine dependence: Secondary | ICD-10-CM | POA: Diagnosis not present

## 2017-09-15 DIAGNOSIS — Z452 Encounter for adjustment and management of vascular access device: Secondary | ICD-10-CM | POA: Insufficient documentation

## 2017-09-15 DIAGNOSIS — Z1501 Genetic susceptibility to malignant neoplasm of breast: Secondary | ICD-10-CM | POA: Diagnosis not present

## 2017-09-15 DIAGNOSIS — F419 Anxiety disorder, unspecified: Secondary | ICD-10-CM | POA: Diagnosis not present

## 2017-09-15 DIAGNOSIS — M797 Fibromyalgia: Secondary | ICD-10-CM | POA: Insufficient documentation

## 2017-09-15 DIAGNOSIS — C50211 Malignant neoplasm of upper-inner quadrant of right female breast: Principal | ICD-10-CM | POA: Insufficient documentation

## 2017-09-15 DIAGNOSIS — D241 Benign neoplasm of right breast: Secondary | ICD-10-CM | POA: Diagnosis not present

## 2017-09-15 DIAGNOSIS — C50919 Malignant neoplasm of unspecified site of unspecified female breast: Secondary | ICD-10-CM | POA: Diagnosis present

## 2017-09-15 DIAGNOSIS — Z4001 Encounter for prophylactic removal of breast: Secondary | ICD-10-CM | POA: Insufficient documentation

## 2017-09-15 DIAGNOSIS — Z9221 Personal history of antineoplastic chemotherapy: Secondary | ICD-10-CM | POA: Insufficient documentation

## 2017-09-15 HISTORY — PX: NIPPLE SPARING MASTECTOMY/SENTINAL LYMPH NODE BIOPSY/RECONSTRUCTION/PLACEMENT OF TISSUE EXPANDER: SHX6484

## 2017-09-15 HISTORY — PX: BREAST RECONSTRUCTION WITH PLACEMENT OF TISSUE EXPANDER AND FLEX HD (ACELLULAR HYDRATED DERMIS): SHX6295

## 2017-09-15 HISTORY — PX: PORT-A-CATH REMOVAL: SHX5289

## 2017-09-15 LAB — POCT PREGNANCY, URINE: Preg Test, Ur: NEGATIVE

## 2017-09-15 LAB — GLUCOSE, CAPILLARY: GLUCOSE-CAPILLARY: 167 mg/dL — AB (ref 65–99)

## 2017-09-15 SURGERY — NIPPLE SPARING MASTECTOMY WITH SENTINAL LYMPH NODE BIOPSY AND  RECONSTRUCTION WITH PLACEMENT OF TISSUE EXPANDER
Anesthesia: General | Site: Breast | Wound class: Clean

## 2017-09-15 MED ORDER — NEOMYCIN-POLYMYXIN B GU 40-200000 IR SOLN
Status: DC | PRN
  Administered 2017-09-15: 4 mL

## 2017-09-15 MED ORDER — HYDROMORPHONE HCL 1 MG/ML IJ SOLN
INTRAMUSCULAR | Status: DC | PRN
  Administered 2017-09-15 (×2): .4 mg via INTRAVENOUS
  Administered 2017-09-15: .2 mg via INTRAVENOUS
  Administered 2017-09-15: .4 mg via INTRAVENOUS
  Administered 2017-09-15: .2 mg via INTRAVENOUS

## 2017-09-15 MED ORDER — CEFAZOLIN SODIUM-DEXTROSE 2-4 GM/100ML-% IV SOLN
2.0000 g | Freq: Three times a day (TID) | INTRAVENOUS | Status: DC
  Administered 2017-09-15 – 2017-09-16 (×2): 2 g via INTRAVENOUS
  Filled 2017-09-15 (×4): qty 100

## 2017-09-15 MED ORDER — LACTATED RINGERS IV SOLN
INTRAVENOUS | Status: DC | PRN
  Administered 2017-09-15: 10:00:00 via INTRAVENOUS

## 2017-09-15 MED ORDER — ROCURONIUM BROMIDE 100 MG/10ML IV SOLN
INTRAVENOUS | Status: DC | PRN
  Administered 2017-09-15: 20 mg via INTRAVENOUS
  Administered 2017-09-15: 50 mg via INTRAVENOUS
  Administered 2017-09-15: 20 mg via INTRAVENOUS
  Administered 2017-09-15: 10 mg via INTRAVENOUS

## 2017-09-15 MED ORDER — HYDROCODONE-ACETAMINOPHEN 5-325 MG PO TABS: 1.0000 | ORAL_TABLET | Freq: Four times a day (QID) | ORAL | 0 refills | Status: AC | PRN

## 2017-09-15 MED ORDER — DIPHENHYDRAMINE HCL 12.5 MG/5ML PO ELIX
12.5000 mg | ORAL_SOLUTION | Freq: Four times a day (QID) | ORAL | Status: DC | PRN
  Filled 2017-09-15: qty 5

## 2017-09-15 MED ORDER — LIDOCAINE HCL (CARDIAC) PF 100 MG/5ML IV SOSY
PREFILLED_SYRINGE | INTRAVENOUS | Status: DC | PRN
  Administered 2017-09-15: 100 mg via INTRAVENOUS

## 2017-09-15 MED ORDER — FENTANYL CITRATE (PF) 100 MCG/2ML IJ SOLN
25.0000 ug | INTRAMUSCULAR | Status: DC | PRN
  Administered 2017-09-15: 25 ug via INTRAVENOUS

## 2017-09-15 MED ORDER — ONDANSETRON 4 MG PO TBDP: 4.0000 mg | ORAL_TABLET | Freq: Four times a day (QID) | ORAL | Status: DC | PRN

## 2017-09-15 MED ORDER — NEOMYCIN-POLYMYXIN B GU 40-200000 IR SOLN
Status: AC
  Filled 2017-09-15: qty 4

## 2017-09-15 MED ORDER — CELECOXIB 200 MG PO CAPS
ORAL_CAPSULE | ORAL | Status: AC
  Administered 2017-09-15: 200 mg via ORAL
  Filled 2017-09-15: qty 1

## 2017-09-15 MED ORDER — DEXAMETHASONE SODIUM PHOSPHATE 10 MG/ML IJ SOLN
INTRAMUSCULAR | Status: AC
  Filled 2017-09-15: qty 1

## 2017-09-15 MED ORDER — ONDANSETRON HCL 4 MG/2ML IJ SOLN
INTRAMUSCULAR | Status: DC | PRN
  Administered 2017-09-15: 4 mg via INTRAVENOUS

## 2017-09-15 MED ORDER — FAMOTIDINE 20 MG PO TABS
ORAL_TABLET | ORAL | Status: AC
  Administered 2017-09-15: 20 mg via ORAL
  Filled 2017-09-15: qty 1

## 2017-09-15 MED ORDER — FENTANYL CITRATE (PF) 100 MCG/2ML IJ SOLN
INTRAMUSCULAR | Status: AC
  Administered 2017-09-15: 25 ug via INTRAVENOUS
  Filled 2017-09-15: qty 2

## 2017-09-15 MED ORDER — ACETAMINOPHEN 325 MG PO TABS: 325.0000 mg | ORAL_TABLET | Freq: Four times a day (QID) | ORAL | Status: DC

## 2017-09-15 MED ORDER — NAPROXEN 500 MG PO TABS
500.0000 mg | ORAL_TABLET | Freq: Two times a day (BID) | ORAL | Status: DC | PRN
  Filled 2017-09-15: qty 1

## 2017-09-15 MED ORDER — SODIUM CHLORIDE 0.9 % IV SOLN
INTRAVENOUS | Status: DC | PRN
  Administered 2017-09-15: 12:00:00 via INTRAVENOUS

## 2017-09-15 MED ORDER — FAMOTIDINE 20 MG PO TABS
20.0000 mg | ORAL_TABLET | Freq: Once | ORAL | Status: AC
Start: 2017-09-15 — End: 2017-09-15
  Administered 2017-09-15: 20 mg via ORAL

## 2017-09-15 MED ORDER — CEFAZOLIN SODIUM-DEXTROSE 2-4 GM/100ML-% IV SOLN: 2.0000 g | INTRAVENOUS | Status: DC

## 2017-09-15 MED ORDER — ACETAMINOPHEN 500 MG PO TABS
ORAL_TABLET | ORAL | Status: AC
  Administered 2017-09-15: 1000 mg via ORAL
  Filled 2017-09-15: qty 2

## 2017-09-15 MED ORDER — HYDROCODONE-ACETAMINOPHEN 5-325 MG PO TABS
1.0000 | ORAL_TABLET | ORAL | Status: DC | PRN
  Administered 2017-09-15: 1 via ORAL
  Administered 2017-09-15: 2 via ORAL
  Administered 2017-09-15: 1 via ORAL
  Administered 2017-09-16 (×2): 2 via ORAL
  Filled 2017-09-15 (×4): qty 2

## 2017-09-15 MED ORDER — HYDROMORPHONE HCL 1 MG/ML IJ SOLN
INTRAMUSCULAR | Status: AC
  Filled 2017-09-15: qty 1

## 2017-09-15 MED ORDER — DIPHENHYDRAMINE HCL 50 MG/ML IJ SOLN: 12.5000 mg | Freq: Four times a day (QID) | INTRAMUSCULAR | Status: DC | PRN

## 2017-09-15 MED ORDER — CHLORHEXIDINE GLUCONATE CLOTH 2 % EX PADS: 6.0000 | MEDICATED_PAD | Freq: Once | CUTANEOUS | Status: DC

## 2017-09-15 MED ORDER — HYDROMORPHONE HCL 1 MG/ML IJ SOLN: INTRAMUSCULAR | Status: DC | PRN

## 2017-09-15 MED ORDER — FENTANYL CITRATE (PF) 100 MCG/2ML IJ SOLN
INTRAMUSCULAR | Status: DC | PRN
  Administered 2017-09-15: 100 ug via INTRAVENOUS

## 2017-09-15 MED ORDER — DEXAMETHASONE SODIUM PHOSPHATE 10 MG/ML IJ SOLN
INTRAMUSCULAR | Status: DC | PRN
  Administered 2017-09-15: 10 mg via INTRAVENOUS

## 2017-09-15 MED ORDER — ACETAMINOPHEN 500 MG PO TABS
1000.0000 mg | ORAL_TABLET | ORAL | Status: AC
  Administered 2017-09-15: 1000 mg via ORAL

## 2017-09-15 MED ORDER — ROCURONIUM BROMIDE 50 MG/5ML IV SOLN
INTRAVENOUS | Status: AC
  Filled 2017-09-15: qty 1

## 2017-09-15 MED ORDER — CEPHALEXIN 500 MG PO CAPS: 500.0000 mg | ORAL_CAPSULE | Freq: Four times a day (QID) | ORAL | 0 refills | Status: AC

## 2017-09-15 MED ORDER — MIDAZOLAM HCL 2 MG/2ML IJ SOLN
INTRAMUSCULAR | Status: AC
  Filled 2017-09-15: qty 2

## 2017-09-15 MED ORDER — PROPOFOL 10 MG/ML IV BOLUS
INTRAVENOUS | Status: AC
  Filled 2017-09-15: qty 20

## 2017-09-15 MED ORDER — CEFAZOLIN SODIUM-DEXTROSE 2-4 GM/100ML-% IV SOLN
INTRAVENOUS | Status: AC
  Filled 2017-09-15: qty 100

## 2017-09-15 MED ORDER — PROPOFOL 10 MG/ML IV BOLUS
INTRAVENOUS | Status: DC | PRN
  Administered 2017-09-15: 150 mg via INTRAVENOUS

## 2017-09-15 MED ORDER — HYDROMORPHONE HCL 1 MG/ML IJ SOLN
0.5000 mg | INTRAMUSCULAR | Status: DC | PRN
  Administered 2017-09-15 – 2017-09-16 (×4): 0.5 mg via INTRAVENOUS
  Filled 2017-09-15 (×4): qty 0.5

## 2017-09-15 MED ORDER — ONDANSETRON HCL 4 MG/2ML IJ SOLN: 4.0000 mg | Freq: Once | INTRAMUSCULAR | Status: DC | PRN

## 2017-09-15 MED ORDER — DIAZEPAM 2 MG PO TABS: 2.0000 mg | ORAL_TABLET | Freq: Three times a day (TID) | ORAL | Status: DC | PRN

## 2017-09-15 MED ORDER — DIAZEPAM 2 MG PO TABS: 2.0000 mg | ORAL_TABLET | Freq: Three times a day (TID) | ORAL | 0 refills | Status: DC | PRN

## 2017-09-15 MED ORDER — CELECOXIB 200 MG PO CAPS
200.0000 mg | ORAL_CAPSULE | ORAL | Status: AC
  Administered 2017-09-15: 200 mg via ORAL

## 2017-09-15 MED ORDER — LACTATED RINGERS IV SOLN
INTRAVENOUS | Status: DC
  Administered 2017-09-15: 09:00:00 via INTRAVENOUS

## 2017-09-15 MED ORDER — ONDANSETRON HCL 4 MG PO TABS: 4.0000 mg | ORAL_TABLET | Freq: Every day | ORAL | 1 refills | Status: DC | PRN

## 2017-09-15 MED ORDER — POLYETHYLENE GLYCOL 3350 17 G PO PACK: 17.0000 g | PACK | Freq: Every day | ORAL | Status: DC | PRN

## 2017-09-15 MED ORDER — SENNA 8.6 MG PO TABS
1.0000 | ORAL_TABLET | Freq: Two times a day (BID) | ORAL | Status: DC
  Administered 2017-09-15: 8.6 mg via ORAL
  Filled 2017-09-15: qty 1

## 2017-09-15 MED ORDER — TECHNETIUM TC 99M SULFUR COLLOID FILTERED
1.0000 | Freq: Once | INTRAVENOUS | Status: AC | PRN
Start: 2017-09-15 — End: 2017-09-15
  Administered 2017-09-15: 0.717 via INTRADERMAL

## 2017-09-15 MED ORDER — ONDANSETRON HCL 4 MG/2ML IJ SOLN: 4.0000 mg | Freq: Four times a day (QID) | INTRAMUSCULAR | Status: DC | PRN

## 2017-09-15 MED ORDER — PHENYLEPHRINE HCL 10 MG/ML IJ SOLN
INTRAMUSCULAR | Status: DC | PRN
  Administered 2017-09-15 (×2): 100 ug via INTRAVENOUS
  Administered 2017-09-15: 50 ug via INTRAVENOUS
  Administered 2017-09-15 (×2): 100 ug via INTRAVENOUS

## 2017-09-15 MED ORDER — FENTANYL CITRATE (PF) 100 MCG/2ML IJ SOLN
25.0000 ug | INTRAMUSCULAR | Status: DC | PRN
  Administered 2017-09-15 (×4): 25 ug via INTRAVENOUS

## 2017-09-15 MED ORDER — SUCCINYLCHOLINE CHLORIDE 20 MG/ML IJ SOLN
INTRAMUSCULAR | Status: AC
  Filled 2017-09-15: qty 1

## 2017-09-15 MED ORDER — LIDOCAINE HCL (PF) 2 % IJ SOLN
INTRAMUSCULAR | Status: AC
  Filled 2017-09-15: qty 10

## 2017-09-15 MED ORDER — MIDAZOLAM HCL 2 MG/2ML IJ SOLN
INTRAMUSCULAR | Status: DC | PRN
  Administered 2017-09-15: 2 mg via INTRAVENOUS

## 2017-09-15 MED ORDER — GABAPENTIN 300 MG PO CAPS
ORAL_CAPSULE | ORAL | Status: AC
  Administered 2017-09-15: 300 mg via ORAL
  Filled 2017-09-15: qty 1

## 2017-09-15 MED ORDER — BUPIVACAINE-EPINEPHRINE 0.25% -1:200000 IJ SOLN
INTRAMUSCULAR | Status: DC | PRN
  Administered 2017-09-15: 9 mL

## 2017-09-15 MED ORDER — GABAPENTIN 300 MG PO CAPS
300.0000 mg | ORAL_CAPSULE | ORAL | Status: AC
  Administered 2017-09-15: 300 mg via ORAL

## 2017-09-15 MED ORDER — FENTANYL CITRATE (PF) 250 MCG/5ML IJ SOLN
INTRAMUSCULAR | Status: AC
  Filled 2017-09-15: qty 5

## 2017-09-15 MED ORDER — ZOLPIDEM TARTRATE 5 MG PO TABS: 5.0000 mg | ORAL_TABLET | Freq: Every evening | ORAL | Status: DC | PRN

## 2017-09-15 MED ORDER — BUPIVACAINE-EPINEPHRINE (PF) 0.25% -1:200000 IJ SOLN
INTRAMUSCULAR | Status: AC
  Filled 2017-09-15: qty 30

## 2017-09-15 MED ORDER — SUGAMMADEX SODIUM 200 MG/2ML IV SOLN
INTRAVENOUS | Status: DC | PRN
  Administered 2017-09-15: 150 mg via INTRAVENOUS

## 2017-09-15 MED ORDER — KCL IN DEXTROSE-NACL 20-5-0.45 MEQ/L-%-% IV SOLN
INTRAVENOUS | Status: DC
  Administered 2017-09-15 – 2017-09-16 (×2): via INTRAVENOUS
  Filled 2017-09-15 (×4): qty 1000

## 2017-09-15 SURGICAL SUPPLY — 97 items
ADH SKN CLS APL DERMABOND .7 (GAUZE/BANDAGES/DRESSINGS) ×12
APPLIER CLIP 11 MED OPEN (CLIP) ×5
APR CLP MED 11 20 MLT OPN (CLIP) ×3
BAG DECANTER FOR FLEXI CONT (MISCELLANEOUS) ×5 IMPLANT
BANDAGE ELASTIC 6 LF NS (GAUZE/BANDAGES/DRESSINGS) ×5 IMPLANT
BINDER BREAST LRG (GAUZE/BANDAGES/DRESSINGS) ×7 IMPLANT
BINDER BREAST MEDIUM (GAUZE/BANDAGES/DRESSINGS) ×5 IMPLANT
BINDER BREAST XLRG (GAUZE/BANDAGES/DRESSINGS) ×5 IMPLANT
BINDER BREAST XXLRG (GAUZE/BANDAGES/DRESSINGS) ×5 IMPLANT
BIOPATCH RED 1 DISK 7.0 (GAUZE/BANDAGES/DRESSINGS) ×2 IMPLANT
BIOPATCH RED 1IN DISK 7.0MM (GAUZE/BANDAGES/DRESSINGS) ×2
BIOPATCH WHT 1IN DISK W/4.0 H (GAUZE/BANDAGES/DRESSINGS) ×7 IMPLANT
BLADE BOVIE TIP EXT 4 (BLADE) ×5 IMPLANT
BLADE PHOTON ILLUMINATED (MISCELLANEOUS) ×2 IMPLANT
BLADE SURG 15 STRL LF DISP TIS (BLADE) ×3 IMPLANT
BLADE SURG 15 STRL SS (BLADE) ×5
BLADE SURG 15 STRL SS SAFETY (BLADE) ×5 IMPLANT
BNDG CMPR MED 5X6 ELC HKLP NS (GAUZE/BANDAGES/DRESSINGS) ×3
BNDG GAUZE 4.5X4.1 6PLY STRL (MISCELLANEOUS) ×10 IMPLANT
BULB RESERV EVAC DRAIN JP 100C (MISCELLANEOUS) ×12 IMPLANT
CANISTER SUCT 1200ML W/VALVE (MISCELLANEOUS) ×10 IMPLANT
CHLORAPREP W/TINT 26ML (MISCELLANEOUS) ×15 IMPLANT
CLIP APPLIE 11 MED OPEN (CLIP) IMPLANT
CNTNR SPEC 2.5X3XGRAD LEK (MISCELLANEOUS) ×15
CONT SPEC 4OZ STER OR WHT (MISCELLANEOUS) ×10
CONT SPEC 4OZ STRL OR WHT (MISCELLANEOUS) ×15
CONTAINER SPEC 2.5X3XGRAD LEK (MISCELLANEOUS) ×9 IMPLANT
DECANTER SPIKE VIAL GLASS SM (MISCELLANEOUS) IMPLANT
DERMABOND ADVANCED (GAUZE/BANDAGES/DRESSINGS) ×8
DERMABOND ADVANCED .7 DNX12 (GAUZE/BANDAGES/DRESSINGS) ×12 IMPLANT
DRAIN CHANNEL 19F RND (DRAIN) ×17 IMPLANT
DRAPE CHEST BREAST 77X106 FENE (MISCELLANEOUS) ×5 IMPLANT
DRAPE SURG 17X11 SM STRL (DRAPES) ×4 IMPLANT
DRAPE UTILITY 15X26 TOWEL STRL (DRAPES) ×10 IMPLANT
DRSG TELFA 3X8 NADH (GAUZE/BANDAGES/DRESSINGS) ×5 IMPLANT
ELECT CAUTERY BLADE 6.4 (BLADE) ×5 IMPLANT
ELECT CAUTERY BLADE TIP 2.5 (TIP) ×10
ELECT REM PT RETURN 9FT ADLT (ELECTROSURGICAL) ×5
ELECTRODE CAUTERY BLDE TIP 2.5 (TIP) ×6 IMPLANT
ELECTRODE REM PT RTRN 9FT ADLT (ELECTROSURGICAL) ×3 IMPLANT
GAUZE SPONGE 4X4 12PLY STRL (GAUZE/BANDAGES/DRESSINGS) ×5 IMPLANT
GLOVE BIO SURGEON STRL SZ 6.5 (GLOVE) ×8 IMPLANT
GLOVE BIO SURGEON STRL SZ7.5 (GLOVE) ×5 IMPLANT
GLOVE BIO SURGEONS STRL SZ 6.5 (GLOVE) ×2
GOWN STRL REUS W/ TWL LRG LVL3 (GOWN DISPOSABLE) ×18 IMPLANT
GOWN STRL REUS W/TWL LRG LVL3 (GOWN DISPOSABLE) ×30
GRAFT FLEX HD 4X16 THICK (Tissue Mesh) ×10 IMPLANT
IMPL BREAST XPD TISS SUT 350 (Breast) IMPLANT
IMPL BRST XPD TISS SUT 350CC (Breast) ×6 IMPLANT
IMPLANT BREAST 350CC (Breast) ×10 IMPLANT
IV NS 1000ML (IV SOLUTION) ×5
IV NS 1000ML BAXH (IV SOLUTION) ×3 IMPLANT
IV NS 500ML (IV SOLUTION) ×5
IV NS 500ML BAXH (IV SOLUTION) ×3 IMPLANT
LABEL OR SOLS (LABEL) ×5 IMPLANT
MARGIN MAP 10MM (MISCELLANEOUS) ×4 IMPLANT
NDL 21 GA WING INFUSION (NEEDLE) ×36 IMPLANT
NEEDLE 21 GA WING INFUSION (NEEDLE) ×5 IMPLANT
NEEDLE HYPO 22GX1.5 SAFETY (NEEDLE) ×7 IMPLANT
PACK BASIN MAJOR ARMC (MISCELLANEOUS) ×5 IMPLANT
PACK BASIN MINOR ARMC (MISCELLANEOUS) ×5 IMPLANT
PACK UNIVERSAL (MISCELLANEOUS) ×5 IMPLANT
PAD ABD DERMACEA PRESS 5X9 (GAUZE/BANDAGES/DRESSINGS) ×25 IMPLANT
PAD DRESSING TELFA 3X8 NADH (GAUZE/BANDAGES/DRESSINGS) ×3 IMPLANT
PAD PREP 24X41 OB/GYN DISP (PERSONAL CARE ITEMS) ×10 IMPLANT
PIN SAFETY STRL (MISCELLANEOUS) ×10 IMPLANT
SET ASEPTIC TRANSFER (MISCELLANEOUS) ×17 IMPLANT
SLEVE PROBE SENORX GAMMA FIND (MISCELLANEOUS) ×5 IMPLANT
SPONGE LAP 18X18 5 PK (GAUZE/BANDAGES/DRESSINGS) ×20 IMPLANT
SUT ETHILON 3 0 PS 1 (SUTURE) ×5 IMPLANT
SUT ETHILON 3-0 FS-10 30 BLK (SUTURE) ×5
SUT MNCRL 3-0 UNDYED SH (SUTURE) ×6 IMPLANT
SUT MNCRL AB 4-0 PS2 18 (SUTURE) ×25 IMPLANT
SUT MNCRL+ 5-0 UNDYED PC-3 (SUTURE) ×12 IMPLANT
SUT MONOCRYL 3-0 UNDYED (SUTURE) ×4
SUT MONOCRYL 5-0 (SUTURE) ×8
SUT PDS AB 1 CT1 27 (SUTURE) ×4 IMPLANT
SUT PDS PLUS 2 (SUTURE) ×4
SUT PDS PLUS AB 2-0 CT-1 (SUTURE) IMPLANT
SUT SILK 0 (SUTURE) ×5
SUT SILK 0 30XBRD TIE 6 (SUTURE) ×3 IMPLANT
SUT SILK 3 0 (SUTURE) ×5
SUT SILK 3 0 SH 30 (SUTURE) ×10 IMPLANT
SUT SILK 3-0 18XBRD TIE 12 (SUTURE) ×3 IMPLANT
SUT VIC AB 0 CT1 36 (SUTURE) ×5 IMPLANT
SUT VIC AB 2-0 CT1 27 (SUTURE) ×20
SUT VIC AB 2-0 CT1 TAPERPNT 27 (SUTURE) ×12 IMPLANT
SUT VIC AB 3-0 SH 27 (SUTURE) ×5
SUT VIC AB 3-0 SH 27X BRD (SUTURE) ×3 IMPLANT
SUT VICRYL+ 3-0 144IN (SUTURE) ×5 IMPLANT
SUTURE EHLN 3-0 FS-10 30 BLK (SUTURE) ×3 IMPLANT
SYR 10ML LL (SYRINGE) ×4 IMPLANT
SYR BULB IRRIG 60ML STRL (SYRINGE) ×10 IMPLANT
TAPE TRANSPORE STRL 2 31045 (GAUZE/BANDAGES/DRESSINGS) ×5 IMPLANT
TOWEL OR 17X26 4PK STRL BLUE (TOWEL DISPOSABLE) ×5 IMPLANT
TUBING CONNECTING 10 (TUBING) ×1 IMPLANT
TUBING CONNECTING 10' (TUBING) ×1

## 2017-09-15 NOTE — Op Note (Signed)
Op report    DATE OF OPERATION:  09/15/2017  LOCATION:  [facility]  SURGICAL DIVISION: Plastic Surgery  PREOPERATIVE DIAGNOSES:  1. Right Breast cancer.   2. BRCA positive.  POSTOPERATIVE DIAGNOSES:  1. Right Breast cancer.  2. BRCA positive  PROCEDURE:  1. Bilateral immediate breast reconstruction with placement of Acellular Dermal Matrix and tissue expanders.  SURGEON: Dennette Faulconer Sanger Drae Mitzel, DO  ANESTHESIA:  General.   COMPLICATIONS: None.   IMPLANTS: Left - Mentor 350 cc. Ref #[id number].  Serial Number [id number], 100 cc of injectable saline placed in the expander. Right - Mentor 350 cc. Ref #[id number].  Serial Number [id number], 100 cc of injectable saline placed in the expander. Acellular Dermal Matrix 4 x 16 two  INDICATIONS FOR PROCEDURE:  The patient, Holly Parker, is a 37 y.o. female born on Nov 28, 1980, is here for  immediate first stage breast reconstruction with placement of bilateral tissue expander and Acellular dermal matrix. MRN: [id number]  CONSENT:  Informed consent was obtained directly from the patient. Risks, benefits and alternatives were fully discussed. Specific risks including but not limited to bleeding, infection, hematoma, seroma, scarring, pain, implant infection, implant extrusion, capsular contracture, asymmetry, wound healing problems, and need for further surgery were all discussed. The patient did have an ample opportunity to have her questions answered to her satisfaction.   DESCRIPTION OF PROCEDURE:  The patient was taken to the operating room by the general surgery team. SCDs were placed and IV antibiotics were given. The patient's chest was prepped and draped in a sterile fashion. A time out was performed and the implants to be used were identified.  Bilateral mastectomies were performed.  Once the general surgery team had completed their portion of the case the patient was rendered to the plastic and  reconstructive surgery team.  Left:  The pectoralis major muscle was lifted from the chest wall with release of the lateral edge and lateral inframammary fold.  The pocket was irrigated with antibiotic solution and hemostasis was achieved with electrocautery.  The ADM was then prepared according to the manufacture guidelines and slits placed to help with postoperative fluid management.  The ADM was then sutured to the inferior and lateral edge of the inframammary fold with 2-0 PDS starting with an interrupted stitch and then a running stitch.  The lateral portion was sutured to with interrupted sutures after the expander was placed.  The expander was prepared according to the manufacture guidelines, the air evacuated and then it was placed under the ADM and pectoralis major muscle.  The inferior and lateral tabs were used to secure the expander to the chest wall with 2-0 PDS.  The drain was placed at the inframammary fold over the ADM and secured to the skin with 3-0 Silk.  The deep layers were closed with 3-0 Monocryl followed by 4-0 Monocryl.  The skin was closed with 5-0 Monocryl and then dermabond was applied.   Right: The pectoralis major muscle was lifted from the chest wall with release of the lateral edge and lateral inframammary fold.  The pocket was irrigated with antibiotic solution and hemostasis was achieved with electrocautery.  The ADM was then prepared according to the manufacture guidelines and slits placed to help with postoperative fluid management.  The ADM was then sutured to the inferior and lateral edge of the inframammary fold with 2-0 PDS starting with an interrupted stitch and then a running stitch.  The lateral portion was sutured to with interrupted sutures after the  expander was placed.  The expander was prepared according to the manufacture guidelines, the air evacuated and then it was placed under the ADM and pectoralis major muscle.  The inferior and lateral tabs were used to  secure the expander to the chest wall with 2-0 PDS.  The drain was placed at the inframammary fold over the ADM and secured to the skin with 3-0 Silk.  The deep layers were closed with 3-0 Monocryl followed by 4-0 Monocryl.  The skin was closed with 5-0 Monocryl and then dermabond was applied.  The ABDs and breast binder were placed.  The patient tolerated the procedure well and there were no complications.  The patient was allowed to wake from anesthesia and taken to the recovery room in satisfactory condition.

## 2017-09-15 NOTE — Anesthesia Procedure Notes (Signed)
Procedure Name: Intubation Date/Time: 09/15/2017 9:51 AM Performed by: Justus Memory, CRNA Pre-anesthesia Checklist: Patient identified, Patient being monitored, Timeout performed, Emergency Drugs available and Suction available Patient Re-evaluated:Patient Re-evaluated prior to induction Oxygen Delivery Method: Circle system utilized Preoxygenation: Pre-oxygenation with 100% oxygen Induction Type: IV induction Ventilation: Mask ventilation without difficulty Laryngoscope Size: Mac and 3 Grade View: Grade I Tube type: Oral Tube size: 7.0 mm Number of attempts: 1 Airway Equipment and Method: Stylet Placement Confirmation: ETT inserted through vocal cords under direct vision,  positive ETCO2 and breath sounds checked- equal and bilateral Secured at: 21 cm Tube secured with: Tape Dental Injury: Teeth and Oropharynx as per pre-operative assessment

## 2017-09-15 NOTE — Anesthesia Preprocedure Evaluation (Signed)
Anesthesia Evaluation  Patient identified by MRN, date of birth, ID band Patient awake    Reviewed: Allergy & Precautions, NPO status , Patient's Chart, lab work & pertinent test results  History of Anesthesia Complications Negative for: history of anesthetic complications  Airway Mallampati: II       Dental   Pulmonary asthma , neg sleep apnea, neg COPD, former smoker,           Cardiovascular (-) hypertension(-) Past MI and (-) CHF (-) dysrhythmias (-) Valvular Problems/Murmurs     Neuro/Psych neg Seizures Anxiety    GI/Hepatic Neg liver ROS, neg GERD  ,  Endo/Other  neg diabetes  Renal/GU negative Renal ROS     Musculoskeletal   Abdominal   Peds  Hematology  (+) anemia ,   Anesthesia Other Findings   Reproductive/Obstetrics                             Anesthesia Physical Anesthesia Plan  ASA: II  Anesthesia Plan: General   Post-op Pain Management:    Induction:   PONV Risk Score and Plan: 3 and Ondansetron, Dexamethasone and Midazolam  Airway Management Planned: Oral ETT  Additional Equipment:   Intra-op Plan:   Post-operative Plan:   Informed Consent: I have reviewed the patients History and Physical, chart, labs and discussed the procedure including the risks, benefits and alternatives for the proposed anesthesia with the patient or authorized representative who has indicated his/her understanding and acceptance.     Plan Discussed with:   Anesthesia Plan Comments:         Anesthesia Quick Evaluation

## 2017-09-15 NOTE — Op Note (Addendum)
09/15/2017  1:21 PM  PATIENT:  Holly Parker  37 y.o. female  PRE-OPERATIVE DIAGNOSIS:  RIGHT BREAST CANCER  POST-OPERATIVE DIAGNOSIS:  RIGHT BREAST CANCER  PROCEDURE:  Procedure(s): BILATERAL NIPPLE SPARING MASTECTOMIES  WITH RIGHT  SENTINAL LYMPH NODE BIOPSY (Bilateral) REMOVAL PORT-A-CATH (N/A)  SURGEON:  Surgeon(s) and Role: Panel 1:    * Jovita Kussmaul, MD - Primary  PHYSICIAN ASSISTANT:   ASSISTANTS: Dr. Marla Roe   ANESTHESIA:   general  EBL:  50 mL   BLOOD ADMINISTERED:50 CC PRBC  DRAINS: none   LOCAL MEDICATIONS USED:  NONE  SPECIMEN:  Source of Specimen:  left mastectomy and right mastectomy and right sentinel node  DISPOSITION OF SPECIMEN:  PATHOLOGY  COUNTS:  YES  TOURNIQUET:  * No tourniquets in log *  DICTATION: .Dragon Dictation   After informed consent was obtained the patient was brought to the operating room and placed in the supine position on the operating table.  After adequate induction of general anesthesia the patient's bilateral chest, breast, and axillary areas were prepped with ChloraPrep, allowed to dry, and draped in usual sterile manner.  An appropriate timeout was performed.  Earlier in the day the patient underwent injection of 1 mCi of technetium sulfur colloid in the subareolar position on the right.  Attention was first turned to the left breast.  An inframammary fold incision was made with a 15 blade knife.  The incision was carried through the skin and subcutaneous tissue sharply with plasma blade.  The dissection was carried all the way to the chest wall.  The breast was then separated from the pectoralis muscle with the pectoralis fascia for the entire distance of the breast posteriorly.  Next the breast was then separated from the skin and subcutaneous tissue by a combination of blunt hemostat dissection and some sharp dissection with the plasma blade.  When the dissection was behind the nipple was Vicryl stitch was used to mark  the location of the nipple.  A shaving from the posterior edge of the nipple was then taken sharply with a 15 blade knife and this was sent for frozen section and was negative.  Once the anterior dissection had reached the posterior dissection superiorly than the breast was removed from the patient.  It was marked appropriately for orientation.  The breast was then sent to pathology for further evaluation.  The skin flaps appeared healthy.  The wound was packed with a moistened lap sponge.  Next a small incision was made through her previous incision on the upper left chest wall.  The incision was carried through the subcutaneous tissue sharply with the 15 blade knife until the capsule surrounding the port was opened.  The port was then pushed out of its capsule and with gentle traction was removed from the patient.  Pressure was held for several minutes until the area was completely hemostatic.  The deep layer the wound was then closed with interrupted 3-0 Vicryl stitches.  The skin was then closed with a running 4-0 Monocryl subcuticular stitch.  Dermabond dressings were applied. Attention was then turned to the right breast.  A similar inframammary fold incision was made with a 15 blade knife.  The incision was carried through the skin and subcutaneous tissue sharply with the plasma blade.  The breast was then separated from the pectoralis muscle with the pectoralis fascia posteriorly for the full length of the breast.  Next the anterior surface of the breast was separated from the skin and  subcutaneous tissue by a combination of blunt hemostat dissection and some sharp dissection with the plasma blade.  Once the dissection reached the posterior edge of the nipple this area was marked with a Vicryl stitch.  A shaving from the posterior edge of the nipple was then sent for frozen section and this was negative.  Once the anterior dissection reached the posterior dissection superiorly than the breast was removed  from the patient.  The breast was marked appropriately for orientation.  The breast was then sent to pathology for further evaluation.  The node seeker was then used to identify a hot spot in the right axilla.  The dissection was carried towards the area of radioactivity sharply with the plasma blade.  I was able to identify a hot lymph node.  This was excised sharply with the plasma blade.  There appeared to be a second node with this and it was also sent in the same specimen.  No other hot or palpable lymph nodes were identified in the right axilla.  The skin flaps appeared healthy.  At this point the operation was turned over to Dr. Marla Roe for the reconstruction.  Her portion will be dictated separately.  The patient tolerated the procedure well.  At the end of the case all needle sponge and instrument counts were correct.  The patient was in stable condition at this point the operation.  PLAN OF CARE: Admit for overnight observation  PATIENT DISPOSITION:  PACU - hemodynamically stable.   Delay start of Pharmacological VTE agent (>24hrs) due to surgical blood loss or risk of bleeding: no

## 2017-09-15 NOTE — Anesthesia Post-op Follow-up Note (Signed)
Anesthesia QCDR form completed.        

## 2017-09-15 NOTE — H&P (Signed)
Holly Parker  Location: [place] Office Patient #: [id number] DOB: [birth year] Married / Language: English / Race: White Female   History of Present Illness  The patient is a 37 year old female who presents for a follow-up for Breast cancer. the patient is a 38 year old white female who was known to be BRCA1 positive. Back in October she was found to have a 1.3 cm mass in the upper portion of the right breast. This was biopsied and came back as a triple negative breast cancer. At that time her nodes were clinically negative. There was a second 6 mm retro-areolar mass that was never biopsied. She has been receiving neoadjuvant chemotherapy and tolerating it well. On her most recent ultrasound the primary mass now measures 9 mm in the retroareolar mass has disappeared. The nodes continue to look normal. She is now ready to have her definitive surgery. She would prefer right mastectomy with sentinel node mapping and left prophylactic mastectomy. She may be a candidate for nipple sparing although the retroareolar mass that was never biopsied may put her at a little higher risk of recurrence. The retroareolar tissue would be biopsied at the time of the mastectomy.   Problem List/Past Medical  BREAST CANCER, STAGE 1, RIGHT (C50.911)   Past Surgical History Breast Biopsy  Right. Colon Polyp Removal - Colonoscopy  Knee Surgery  Left. Oral Surgery   Diagnostic Studies History Colonoscopy  >10 years ago Mammogram  within last year Pap Smear  1-5 years ago  Allergies  No Known Drug Allergies   Medication History  Lidocaine-Prilocaine (2.5-2.5% Cream, External) Active. Ondansetron HCl (8MG Tablet, Oral) Active. Prochlorperazine Maleate (10MG Tablet, Oral) Active. Sertraline HCl (100MG Tablet, Oral) Active. Claritin (10MG Tablet, Oral) Active. Excedrin Migraine (250-250-65MG Tablet, Oral) Active. Tylenol (500MG Capsule, Oral) Active.  Social History  Alcohol  use  Occasional alcohol use. Caffeine use  Carbonated beverages, Coffee, Tea. No drug use  Tobacco use  Former smoker.  Family History  Arthritis  Father. Breast Cancer  Mother, Sister. Melanoma  Father. Ovarian Cancer  Mother.  Pregnancy / Birth History  Age at menarche  18 years. Contraceptive History  Oral contraceptives. Gravida  3 Length (months) of breastfeeding  3-6 Maternal age  80-25 Para  3 Regular periods   Other Problems Anxiety Disorder  Asthma  Back Pain  Breast Cancer  Lump In Breast     Review of Systems General Present- Fatigue. Not Present- Appetite Loss, Chills, Fever, Night Sweats, Weight Gain and Weight Loss. HEENT Present- Earache, Seasonal Allergies and Sinus Pain. Not Present- Hearing Loss, Hoarseness, Nose Bleed, Oral Ulcers, Ringing in the Ears, Sore Throat, Visual Disturbances, Wears glasses/contact lenses and Yellow Eyes. Respiratory Not Present- Bloody sputum, Chronic Cough, Difficulty Breathing, Snoring and Wheezing. Breast Present- Breast Mass and Breast Pain. Not Present- Nipple Discharge and Skin Changes. Cardiovascular Not Present- Chest Pain, Difficulty Breathing Lying Down, Leg Cramps, Palpitations, Rapid Heart Rate, Shortness of Breath and Swelling of Extremities. Gastrointestinal Not Present- Abdominal Pain, Bloating, Bloody Stool, Change in Bowel Habits, Chronic diarrhea, Constipation, Difficulty Swallowing, Excessive gas, Gets full quickly at meals, Hemorrhoids, Indigestion, Nausea, Rectal Pain and Vomiting. Female Genitourinary Not Present- Frequency, Nocturia, Painful Urination, Pelvic Pain and Urgency. Musculoskeletal Present- Back Pain, Joint Stiffness and Muscle Pain. Not Present- Joint Pain, Muscle Weakness and Swelling of Extremities. Neurological Present- Headaches. Not Present- Decreased Memory, Fainting, Numbness, Seizures, Tingling, Tremor, Trouble walking and Weakness. Psychiatric Present- Anxiety and  Frequent crying. Not Present- Bipolar, Change in Sleep  Pattern, Depression and Fearful. Endocrine Not Present- Cold Intolerance, Excessive Hunger, Hair Changes, Heat Intolerance, Hot flashes and New Diabetes. Hematology Not Present- Blood Thinners, Easy Bruising, Excessive bleeding, Gland problems, HIV and Persistent Infections.  Vitals Weight: 143.13 lb Height: 67in Body Surface Area: 1.75 m Body Mass Index: 22.42 kg/m        Physical Exam  General Mental Status-Alert. General Appearance-Consistent with stated age. Hydration-Well hydrated. Voice-Normal.  Head and Neck Head-normocephalic, atraumatic with no lesions or palpable masses. Trachea-midline. Thyroid Gland Characteristics - normal size and consistency.  Eye Eyeball - Bilateral-Extraocular movements intact. Sclera/Conjunctiva - Bilateral-No scleral icterus.  Chest and Lung Exam Chest and lung exam reveals -quiet, even and easy respiratory effort with no use of accessory muscles and on auscultation, normal breath sounds, no adventitious sounds and normal vocal resonance. Inspection Chest Wall - Normal. Back - normal.  Breast Note: there is no palpable mass in either breast. There is no palpable axillary, supraclavicular, or cervical lymphadenopathy.   Cardiovascular Cardiovascular examination reveals -normal heart sounds, regular rate and rhythm with no murmurs and normal pedal pulses bilaterally.  Abdomen Inspection Inspection of the abdomen reveals - No Hernias. Skin - Scar - no surgical scars. Palpation/Percussion Palpation and Percussion of the abdomen reveal - Soft, Non Tender, No Rebound tenderness, No Rigidity (guarding) and No hepatosplenomegaly. Auscultation Auscultation of the abdomen reveals - Bowel sounds normal.  Neurologic Neurologic evaluation reveals -alert and oriented x 3 with no impairment of recent or remote memory. Mental  Status-Normal.  Musculoskeletal Normal Exam - Left-Upper Extremity Strength Normal and Lower Extremity Strength Normal. Normal Exam - Right-Upper Extremity Strength Normal and Lower Extremity Strength Normal.  Lymphatic Head & Neck  General Head & Neck Lymphatics: Bilateral - Description - Normal. Axillary  General Axillary Region: Bilateral - Description - Normal. Tenderness - Non Tender. Femoral & Inguinal  Generalized Femoral & Inguinal Lymphatics: Bilateral - Description - Normal. Tenderness - Non Tender.    Assessment & Plan BREAST CANCER, STAGE 1, RIGHT (C50.911) Impression: the patient appears to have a stage I triple negative cancer of the right breast and is also BRCA1 positive. She has received neoadjuvant chemotherapy and tolerated it well. She is now ready to schedule her definitive surgery. She would also like to have reconstruction and the plastic surgeons are already on board. She is a good candidate for skin sparing mastectomy and also a candidate for nipple sparing although she did have one small focus of enhancement behind the nipple that was never originally biopsied. I have discussed with her in detail the risks and benefits of the operation as well as some of the technical aspects and she understands and wishes to proceed. I have offered her both nipple sparing and skin sparing and I will let her decide. She will also require a right sentinel node mapping. We will coordinate with Dr. Marla Roe. since she is BRCA1 positive she will also have a left prophylactic mastectomy with reconstruction.  Addendum Note will also plan to get MRI to look for any enhancement in the retroareolar area since she is thinking about nipple sparing mastectomy

## 2017-09-15 NOTE — Interval H&P Note (Signed)
History and Physical Interval Note:  09/15/2017 10:38 AM  Holly Parker  has presented today for surgery, with the diagnosis of RIGHT BREAST CANCER  The various methods of treatment have been discussed with the patient and family. After consideration of risks, benefits and other options for treatment, the patient has consented to  Procedure(s): BILATERAL NIPPLE SPARING MASTECTOMIES  WITH RIGHT  SENTINAL LYMPH NODE BIOPSY (Bilateral) REMOVAL PORT-A-CATH (N/A) BREAST RECONSTRUCTION WITH PLACEMENT OF TISSUE EXPANDER AND FLEX HD (ACELLULAR HYDRATED DERMIS) (Bilateral) as a surgical intervention .  The patient's history has been reviewed, patient examined, no change in status, stable for surgery.  I have reviewed the patient's chart and labs.  Questions were answered to the patient's satisfaction.     Loel Lofty Dillingham

## 2017-09-15 NOTE — Interval H&P Note (Signed)
History and Physical Interval Note:  09/15/2017 8:44 AM  Holly Parker  has presented today for surgery, with the diagnosis of RIGHT BREAST CANCER  The various methods of treatment have been discussed with the patient and family. After consideration of risks, benefits and other options for treatment, the patient has consented to  Procedure(s): BILATERAL NIPPLE SPARING MASTECTOMIES  WITH RIGHT  SENTINAL LYMPH NODE BIOPSY (Bilateral) REMOVAL PORT-A-CATH (N/A) BREAST RECONSTRUCTION WITH PLACEMENT OF TISSUE EXPANDER AND FLEX HD (ACELLULAR HYDRATED DERMIS) (Bilateral) as a surgical intervention .  The patient's history has been reviewed, patient examined, no change in status, stable for surgery.  I have reviewed the patient's chart and labs.  Questions were answered to the patient's satisfaction.     TOTH III,Brindy Higginbotham S

## 2017-09-15 NOTE — Discharge Instructions (Signed)
No heavy lifting. Continue binder or sports bra. Drain Care.

## 2017-09-15 NOTE — Transfer of Care (Signed)
Immediate Anesthesia Transfer of Care Note  Patient: Holly Parker  Procedure(s) Performed: BILATERAL NIPPLE SPARING MASTECTOMIES  WITH RIGHT  SENTINAL LYMPH NODE BIOPSY (Bilateral Breast) REMOVAL PORT-A-CATH (N/A ) BREAST RECONSTRUCTION WITH PLACEMENT OF TISSUE EXPANDER AND FLEX HD (ACELLULAR HYDRATED DERMIS) (Bilateral )  Patient Location: PACU  Anesthesia Type:General  Level of Consciousness: awake, alert , oriented and patient cooperative  Airway & Oxygen Therapy: Patient Spontanous Breathing and Patient connected to face mask oxygen  Post-op Assessment: Report given to RN and Post -op Vital signs reviewed and stable  Post vital signs: Reviewed and stable  Last Vitals:  Vitals Value Taken Time  BP 132/76 09/15/2017  2:19 PM  Temp    Pulse 109 09/15/2017  2:21 PM  Resp 10 09/15/2017  2:21 PM  SpO2 100 % 09/15/2017  2:21 PM  Vitals shown include unvalidated device data.  Last Pain:  Vitals:   09/15/17 0827  TempSrc: Temporal  PainSc: 0-No pain         Complications: No apparent anesthesia complications

## 2017-09-16 ENCOUNTER — Encounter: Payer: Self-pay | Admitting: General Surgery

## 2017-09-16 DIAGNOSIS — C50211 Malignant neoplasm of upper-inner quadrant of right female breast: Secondary | ICD-10-CM | POA: Diagnosis not present

## 2017-09-16 LAB — CBC
HEMATOCRIT: 30.9 % — AB (ref 35.0–47.0)
HEMOGLOBIN: 10.7 g/dL — AB (ref 12.0–16.0)
MCH: 34.8 pg — ABNORMAL HIGH (ref 26.0–34.0)
MCHC: 34.6 g/dL (ref 32.0–36.0)
MCV: 100.5 fL — ABNORMAL HIGH (ref 80.0–100.0)
Platelets: 204 10*3/uL (ref 150–440)
RBC: 3.07 MIL/uL — ABNORMAL LOW (ref 3.80–5.20)
RDW: 13.2 % (ref 11.5–14.5)
WBC: 9.8 10*3/uL (ref 3.6–11.0)

## 2017-09-16 LAB — BASIC METABOLIC PANEL
ANION GAP: 6 (ref 5–15)
BUN: 12 mg/dL (ref 6–20)
CO2: 28 mmol/L (ref 22–32)
Calcium: 8.5 mg/dL — ABNORMAL LOW (ref 8.9–10.3)
Chloride: 103 mmol/L (ref 101–111)
Creatinine, Ser: 0.58 mg/dL (ref 0.44–1.00)
Glucose, Bld: 120 mg/dL — ABNORMAL HIGH (ref 65–99)
POTASSIUM: 4 mmol/L (ref 3.5–5.1)
SODIUM: 137 mmol/L (ref 135–145)

## 2017-09-16 MED ORDER — POLYETHYLENE GLYCOL 3350 17 G PO PACK: 17.0000 g | PACK | Freq: Every day | ORAL | 0 refills | Status: DC | PRN

## 2017-09-16 MED ORDER — NAPROXEN 500 MG PO TABS: 500.0000 mg | ORAL_TABLET | Freq: Two times a day (BID) | ORAL | Status: AC | PRN

## 2017-09-16 MED ORDER — DIAZEPAM 2 MG PO TABS: 2.0000 mg | ORAL_TABLET | Freq: Three times a day (TID) | ORAL | 0 refills | Status: DC | PRN

## 2017-09-16 MED ORDER — DIPHENHYDRAMINE HCL 12.5 MG/5ML PO ELIX: 12.5000 mg | ORAL_SOLUTION | Freq: Four times a day (QID) | ORAL | 0 refills | Status: DC | PRN

## 2017-09-16 MED ORDER — ACETAMINOPHEN 325 MG PO TABS: 325.0000 mg | ORAL_TABLET | Freq: Four times a day (QID) | ORAL | Status: AC

## 2017-09-16 MED ORDER — SENNA 8.6 MG PO TABS: 1.0000 | ORAL_TABLET | Freq: Two times a day (BID) | ORAL | 0 refills | Status: DC

## 2017-09-16 NOTE — Discharge Summary (Signed)
Physician Discharge Summary  Patient ID: Holly Parker MRN: [id number] DOB/AGE: 39-Dec-1982 39 y.o.  Admit date: 09/15/2017 Discharge date: 09/16/2017  Admission Diagnoses:  Discharge Diagnoses:  Active Problems:   Breast cancer [facility])   Discharged Condition: good  Hospital Course: The patient was taken to the OR for bilateral mastectomies.  Immediate reconstruction was done with expanders and flexHD.  She did well postoperatively.  She was eating, walking and pain controlled.  Consults: None  Significant Diagnostic Studies: none  Treatments: surgery  Discharge Exam: Blood pressure (!) 89/57, pulse 76, temperature 98.3 F (36.8 C), temperature source Oral, resp. rate 19, height 5\' 7"  (1.702 m), weight 64.4 kg (142 lb), SpO2 97 %. General appearance: alert, cooperative and no distress  Disposition: Discharge disposition: 01-Home or Self Care       Discharge Instructions    Call MD for:  difficulty breathing, headache or visual disturbances   Complete by:  As directed    Call MD for:  persistant nausea and vomiting   Complete by:  As directed    Call MD for:  redness, tenderness, or signs of infection (pain, swelling, redness, odor or green/yellow discharge around incision site)   Complete by:  As directed    Call MD for:  severe uncontrolled pain   Complete by:  As directed    Call MD for:  temperature >100.4   Complete by:  As directed    Diet general   Complete by:  As directed    Discharge wound care:   Complete by:  As directed    Drain care   Driving Restrictions   Complete by:  As directed    No driving while on pain medication.   Increase activity slowly   Complete by:  As directed    Lifting restrictions   Complete by:  As directed    No heavy lifting.      Follow-up Information    Dillingham, Loel Lofty, DO In 1 week.   Specialty:  Plastic Surgery Contact information: [address] [place] [address] [phone]        Autumn Messing III, MD In 2 weeks.   Specialty:  General Surgery Contact information: [place] [address] [phone]           Signed: Wallace Going 09/16/2017, 7:11 AM

## 2017-09-16 NOTE — Anesthesia Postprocedure Evaluation (Signed)
Anesthesia Post Note  Patient: Holly Parker  Procedure(s) Performed: BILATERAL NIPPLE SPARING MASTECTOMIES  WITH RIGHT  SENTINAL LYMPH NODE BIOPSY (Bilateral Breast) REMOVAL PORT-A-CATH (N/A ) BREAST RECONSTRUCTION WITH PLACEMENT OF TISSUE EXPANDER AND FLEX HD (ACELLULAR HYDRATED DERMIS) (Bilateral )  Patient location during evaluation: PACU Anesthesia Type: General Level of consciousness: awake and alert Pain management: pain level controlled Vital Signs Assessment: post-procedure vital signs reviewed and stable Respiratory status: spontaneous breathing, nonlabored ventilation, respiratory function stable and patient connected to nasal cannula oxygen Cardiovascular status: blood pressure returned to baseline and stable Postop Assessment: no apparent nausea or vomiting Anesthetic complications: no     Last Vitals:  Vitals:   09/16/17 0842 09/16/17 1021  BP: (!) 93/53 104/66  Pulse: 79   Resp: 16   Temp: 36.8 C   SpO2: 96%     Last Pain:  Vitals:   09/16/17 1022  TempSrc:   PainSc: 4                  Martha Clan

## 2017-09-16 NOTE — Progress Notes (Signed)
Holly Parker to be D/C'd home per MD order.  Discussed prescriptions and follow up appointments with the patient. Prescriptions given to patient, medication list explained in detail. Pt verbalized understanding.  Allergies as of 09/16/2017      Reactions   Seasonal Ic [cholestatin] Other (See Comments)   Headaches and sinus infections, runny nose      Medication List    TAKE these medications   acetaminophen 325 MG tablet Commonly known as:  TYLENOL Take 1 tablet (325 mg total) by mouth every 6 (six) hours.   cephALEXin 500 MG capsule Commonly known as:  KEFLEX Take 1 capsule (500 mg total) by mouth 4 (four) times daily for 10 days.   cromolyn 5.2 MG/ACT nasal spray Commonly known as:  NASALCROM Place 1 spray into both nostrils 4 (four) times daily as needed for allergies or rhinitis.   diazepam 2 MG tablet Commonly known as:  VALIUM Take 1 tablet (2 mg total) by mouth every 8 (eight) hours as needed for anxiety.   diazepam 2 MG tablet Commonly known as:  VALIUM Take 1 tablet (2 mg total) by mouth every 8 (eight) hours as needed for muscle spasms.   diphenhydrAMINE 12.5 MG/5ML elixir Commonly known as:  BENADRYL Take 5 mLs (12.5 mg total) by mouth every 6 (six) hours as needed for itching.   fluticasone 50 MCG/ACT nasal spray Commonly known as:  FLONASE Place 1 spray into both nostrils daily as needed for allergies.   HYDROcodone-acetaminophen 5-325 MG tablet Commonly known as:  NORCO Take 1 tablet by mouth every 6 (six) hours as needed for up to 7 days for moderate pain. Notes to patient:  Last dose given today at 9:25am   loratadine 10 MG tablet Commonly known as:  CLARITIN Take 10 mg daily by mouth.   multivitamin with minerals Tabs tablet Take 1 tablet by mouth daily. One-Parker-Day   naproxen 500 MG tablet Commonly known as:  NAPROSYN Take 1 tablet (500 mg total) by mouth 2 (two) times daily as needed for mild pain.   ondansetron 4 MG tablet Commonly known  as:  ZOFRAN Take 1 tablet (4 mg total) by mouth daily as needed for nausea or vomiting.   polyethylene glycol packet Commonly known as:  MIRALAX / GLYCOLAX Take 17 g by mouth daily as needed for mild constipation.   pseudoephedrine-acetaminophen 30-500 MG Tabs tablet Commonly known as:  TYLENOL SINUS Take 1-2 tablets by mouth every 4 (four) hours as needed (for congestion/allergies.).   senna 8.6 MG Tabs tablet Commonly known as:  SENOKOT Take 1 tablet (8.6 mg total) by mouth 2 (two) times daily.   sertraline 100 MG tablet Commonly known as:  ZOLOFT Take 100 mg by mouth at bedtime.            Discharge Care Instructions  (From admission, onward)        Start     Ordered   09/16/17 0000  Discharge wound care:    Comments:  Drain care   09/16/17 0711      Vitals:   09/16/17 0448 09/16/17 0842  BP: (!) 89/57 (!) 93/53  Pulse: 76 79  Resp:  16  Temp:  98.2 F (36.8 C)  SpO2:  96%    Skin clean, dry and intact without evidence of skin break down, no evidence of skin tears noted. IV catheter discontinued intact. Site without signs and symptoms of complications. Dressing and pressure applied. Pt denies pain at this time. No  complaints noted.  An After Visit Summary was printed and given to the patient. Patient escorted via [place], and D/C home via private auto.  Holly Parker

## 2017-09-16 NOTE — Care Management Obs Status (Signed)
[facility] NOTIFICATION   Patient Details  Name: ODESSIE POLZIN MRN: [id number] Date of Birth: [birth year]   Medicare Observation Status Notification Given:  No(admitted obs less than 24 hours)    Beverly Sessions, RN 09/16/2017, 10:06 AM

## 2017-09-17 LAB — HIV ANTIBODY (ROUTINE TESTING W REFLEX): HIV SCREEN 4TH GENERATION: NONREACTIVE

## 2017-09-18 LAB — SURGICAL PATHOLOGY

## 2017-09-23 DIAGNOSIS — Z9013 Acquired absence of bilateral breasts and nipples: Secondary | ICD-10-CM | POA: Insufficient documentation

## 2017-09-25 ENCOUNTER — Other Ambulatory Visit: Payer: Self-pay | Admitting: *Deleted

## 2017-09-29 ENCOUNTER — Encounter

## 2017-09-29 ENCOUNTER — Telehealth: Payer: Self-pay | Admitting: Internal Medicine

## 2017-09-29 NOTE — Telephone Encounter (Signed)
Holly Parker/brooke- Please inform patient that I would like to see her before going on vacation to review the pathology; next plan of care-I would prefer to see her on June 5 at 3:15; no labs. Thx

## 2017-09-29 NOTE — Telephone Encounter (Signed)
Pt has an apt 6/5 at 3:15pm per md order

## 2017-10-01 ENCOUNTER — Other Ambulatory Visit: Payer: Self-pay

## 2017-10-01 ENCOUNTER — Inpatient Hospital Stay: Attending: Internal Medicine | Admitting: Internal Medicine

## 2017-10-01 ENCOUNTER — Encounter: Payer: Self-pay | Admitting: Internal Medicine

## 2017-10-01 VITALS — BP 99/64 | HR 93 | Temp 98.7°F | Resp 16 | Ht 67.0 in | Wt 147.2 lb

## 2017-10-01 DIAGNOSIS — Z803 Family history of malignant neoplasm of breast: Secondary | ICD-10-CM | POA: Insufficient documentation

## 2017-10-01 DIAGNOSIS — C50211 Malignant neoplasm of upper-inner quadrant of right female breast: Secondary | ICD-10-CM | POA: Diagnosis not present

## 2017-10-01 DIAGNOSIS — Z807 Family history of other malignant neoplasms of lymphoid, hematopoietic and related tissues: Secondary | ICD-10-CM | POA: Diagnosis not present

## 2017-10-01 DIAGNOSIS — Z87891 Personal history of nicotine dependence: Secondary | ICD-10-CM | POA: Insufficient documentation

## 2017-10-01 DIAGNOSIS — Z171 Estrogen receptor negative status [ER-]: Secondary | ICD-10-CM | POA: Diagnosis not present

## 2017-10-01 DIAGNOSIS — Z806 Family history of leukemia: Secondary | ICD-10-CM | POA: Diagnosis not present

## 2017-10-01 DIAGNOSIS — Z801 Family history of malignant neoplasm of trachea, bronchus and lung: Secondary | ICD-10-CM

## 2017-10-01 DIAGNOSIS — Z8041 Family history of malignant neoplasm of ovary: Secondary | ICD-10-CM | POA: Insufficient documentation

## 2017-10-01 MED ORDER — CAPECITABINE 150 MG PO TABS: 300.0000 mg | ORAL_TABLET | Freq: Two times a day (BID) | ORAL | 5 refills | Status: DC

## 2017-10-01 MED ORDER — CAPECITABINE 500 MG PO TABS: 1500.0000 mg | ORAL_TABLET | Freq: Two times a day (BID) | ORAL | 5 refills | Status: DC

## 2017-10-01 NOTE — Assessment & Plan Note (Addendum)
#  Stage I triple negative breast cancer-status post mastectomy; sentinel lymph node biopsy.  Post surgery pathology shows 6 mm residual malignancy; 3 sentinel lymph nodes negative.  #Long discussion with the patient and mother regarding the overall good response from the chemotherapy; but unfortunately absence of complete pathologic response.  #I reviewed the data from create X trial-the patient's with residual disease post neoadjuvant chemotherapy with Xeloda had improvement of disease-free survival compared to placebo; also a small survival advantage.   #Given patient's young age; being motivated-I would recommend Xeloda thousand milligrams twice daily 2 weeks on 1 week off.  I discussed the potential side effects of Xeloda including but not limited to diarrhea mucositis hand-foot syndrome.  Discussed the prophylactic measures.  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   #Patient follow-up with me in approximately 4 weeks; labs; proceed with Xeloda at that time.  #Follow-up in approximately 4 weeks labs; discussed with Ebony Hail.

## 2017-10-01 NOTE — Progress Notes (Signed)
Patient here for follow up she reports surgical pain   muscle cramping.

## 2017-10-01 NOTE — Progress Notes (Signed)
[facility] OFFICE PROGRESS NOTE  Patient Care Team: Patient, No Pcp Per as PCP - General (General Practice) Magrinat, Virgie Dad, MD as Consulting Physician (Oncology) Marga Hoots, MD (Obstetrics and Gynecology) Dillingham, Loel Lofty, DO as Attending Physician (Plastic Surgery) Coralie Keens, MD as Consulting Physician (General Surgery) Cammie Sickle, MD as Consulting Physician (Internal Medicine)  Cancer Staging No matching staging information was found for the patient.   Oncology History   # OCT 2018- RIGHT BREAST UIQ -[place]; TRIPLE NEGATIVE; ki-67-high [61m-1'O clock-Bx- proven; [facility]]; another- 12'O clock-420m Not biopsied. [s/p Dr.Magrinaut; [place]]; cT1c(m) cN0;  G-3; ki-67- 80%  # 11/218- ddACesolved;  x4 cyles- US/ammo- improved 92m78mothe leison-r-Taxol+carbo [finished ealry April 2019]  # MAY 2019- s/p Mastec& SLNBx [Dr.Toth; [place]]; 6mm32msidual ca; 3 SLN-NEG [ypT1bsnN0]  # BRCA-1 Gene mutated [mom-ovarian cancer-Stage IIIB/Brca; older sister- breast ca/brca-mutated]  # MUGA scan [nov [id number]]%; UPBEAT clinical trial; port explanted.  --------------------------------------------------------   DIAGNOSIS: TRIPLE NEGATIVE BREAST CA  STAGE:  I   ; GOALS: CURATIVE  CURRENT/MOST RECENT THERAPY; Xeloda       Malignant neoplasm of upper-inner quadrant of right breast in female, estrogen receptor negative (HCC)[place]   INTERVAL HISTORY:  Holly GERETYy20.  female pleasant patient above history of stage I triple negative breast cancer status post mastectomy is here for follow-up/the results of her final pathology.  Patient is recovering fairly well from surgery.  She complains of mild soreness at the surgery site.  Otherwise denies any significant complaints.  Review of Systems  Constitutional: Negative for chills, diaphoresis, fever, malaise/fatigue and weight loss.  HENT: Negative for nosebleeds and sore throat.   Eyes: Negative for  double vision.  Respiratory: Negative for cough, hemoptysis, sputum production, shortness of breath and wheezing.   Cardiovascular: Negative for chest pain, palpitations, orthopnea and leg swelling.  Gastrointestinal: Negative for abdominal pain, blood in stool, constipation, diarrhea, heartburn, melena, nausea and vomiting.  Genitourinary: Negative for dysuria, frequency and urgency.  Musculoskeletal: Negative for back pain and joint pain.  Skin: Negative.  Negative for itching and rash.  Neurological: Negative for dizziness, tingling, focal weakness, weakness and headaches.  Endo/Heme/Allergies: Does not bruise/bleed easily.  Psychiatric/Behavioral: Negative for depression. The patient is not nervous/anxious and does not have insomnia.       PAST MEDICAL HISTORY :  Past Medical History:  Diagnosis Date  . Anemia    iron deficiency during chemotherapy  . Anxiety   . Asthma    Excercise induced as a child  . Fibromyalgia   . Headache    migraine headaches due to sinus/allergies and teeth grinding at night  . Malignant neoplasm of upper-inner quadrant of right breast in female, estrogen receptor negative (HCC)[place]   right breast  . Personal history of chemotherapy    Right breast- 4 treatments    PAST SURGICAL HISTORY :   Past Surgical History:  Procedure Laterality Date  . BREAST RECONSTRUCTION WITH PLACEMENT OF TISSUE EXPANDER AND FLEX HD (ACELLULAR HYDRATED DERMIS) Bilateral 09/15/2017   Procedure: BREAST RECONSTRUCTION WITH PLACEMENT OF TISSUE EXPANDER AND FLEX HD (ACELLULAR HYDRATED DERMIS);  Surgeon: DillWallace Going;  Location: [facility] ORS;  Service: Plastics;  Laterality: Bilateral;  . IR FLUORO GUIDE PORT INSERTION LEFT  03/17/2017  . KNEE SURGERY Left 2009   Torn meniscus   . NIPPLE SPARING MASTECTOMY/SENTINAL LYMPH NODE BIOPSY/RECONSTRUCTION/PLACEMENT OF TISSUE EXPANDER Bilateral 09/15/2017   Procedure: BILATERAL NIPPLE SPARING MASTECTOMIES  WITH RIGHT  SENTINAL  LYMPH NODE BIOPSY;  Surgeon: Jovita Kussmaul, MD;  Location: [facility] ORS;  Service: General;  Laterality: Bilateral;  . PORT-A-CATH REMOVAL N/A 09/15/2017   Procedure: REMOVAL PORT-A-CATH;  Surgeon: Jovita Kussmaul, MD;  Location: [facility] ORS;  Service: General;  Laterality: N/A;  . RHINOPLASTY  [id number]   both surgeries due to broken nose    FAMILY HISTORY :   Family History  Problem Relation Age of Onset  . Cancer Mother        Ovarian cancer (2003), breast cancer(2017) & squamous cell   . Leukemia Mother   . Breast cancer Mother 67  . Cancer Sister        breast cancer  . Breast cancer Sister 26  . Melanoma Father   . Lung cancer Paternal Grandmother     SOCIAL HISTORY:   Social History   Tobacco Use  . Smoking status: Former Smoker    Packs/day: 0.25    Types: Cigarettes    Last attempt to quit: 09/16/2011    Years since quitting: 6.0  . Smokeless tobacco: Never Used  Substance Use Topics  . Alcohol use: No  . Drug use: No    ALLERGIES:  is allergic to seasonal ic [cholestatin].  MEDICATIONS:  Current Outpatient Medications  Medication Sig Dispense Refill  . acetaminophen (TYLENOL) 325 MG tablet Take 1 tablet (325 mg total) by mouth every 6 (six) hours.    . cromolyn (NASALCROM) 5.2 MG/ACT nasal spray Place 1 spray into both nostrils 4 (four) times daily as needed for allergies or rhinitis. 26 mL 0  . diazepam (VALIUM) 2 MG tablet Take 1 tablet (2 mg total) by mouth every 8 (eight) hours as needed for anxiety. 30 tablet 0  . diazepam (VALIUM) 2 MG tablet Take 1 tablet (2 mg total) by mouth every 8 (eight) hours as needed for muscle spasms. 30 tablet 0  . diphenhydrAMINE (BENADRYL) 12.5 MG/5ML elixir Take 5 mLs (12.5 mg total) by mouth every 6 (six) hours as needed for itching. 120 mL 0  . fluticasone (FLONASE) 50 MCG/ACT nasal spray Place 1 spray into both nostrils daily as needed for allergies.    Marland Kitchen HYDROcodone-acetaminophen (NORCO/VICODIN) 5-325 MG tablet Take 1 tablet by  mouth every 6 (six) hours as needed.    . loratadine (CLARITIN) 10 MG tablet Take 10 mg daily by mouth.    . Multiple Vitamin (MULTIVITAMIN WITH MINERALS) TABS tablet Take 1 tablet by mouth daily. One-A-Day    . naproxen (NAPROSYN) 500 MG tablet Take 1 tablet (500 mg total) by mouth 2 (two) times daily as needed for mild pain.    Marland Kitchen ondansetron (ZOFRAN) 4 MG tablet Take 1 tablet (4 mg total) by mouth daily as needed for nausea or vomiting. 10 tablet 1  . polyethylene glycol (MIRALAX / GLYCOLAX) packet Take 17 g by mouth daily as needed for mild constipation. 14 each 0  . pseudoephedrine-acetaminophen (TYLENOL SINUS) 30-500 MG TABS tablet Take 1-2 tablets by mouth every 4 (four) hours as needed (for congestion/allergies.).    Marland Kitchen senna (SENOKOT) 8.6 MG TABS tablet Take 1 tablet (8.6 mg total) by mouth 2 (two) times daily. 120 each 0  . sertraline (ZOLOFT) 100 MG tablet Take 100 mg by mouth at bedtime.     . capecitabine (XELODA) 150 MG tablet Take 2 tablets (300 mg total) by mouth 2 (two) times daily after a meal. 2 weeks-ON and 1 week-OFF [take with 1500 mg capecitabine] 56  tablet 5  . capecitabine (XELODA) 500 MG tablet Take 3 tablets (1,500 mg total) by mouth 2 (two) times daily after a meal. 2 weeks-ON; 1 week-OFF [take with 316m of xeloda pills] 84 tablet 5   No current facility-administered medications for this visit.     PHYSICAL EXAMINATION: ECOG PERFORMANCE STATUS: 0 - Asymptomatic  BP 99/64 (BP Location: Left Arm, Patient Position: Sitting)   Pulse 93   Temp 98.7 F (37.1 C) (Oral)   Resp 16   Ht '5\' 7"'  (1.702 m)   Wt 147 lb 3.2 oz (66.8 kg)   BMI 23.05 kg/m   Filed Weights   10/01/17 1509  Weight: 147 lb 3.2 oz (66.8 kg)    GENERAL: Well-nourished well-developed; Alert, no distress and comfortable.  Accompanied by family.  EYES: no pallor or icterus OROPHARYNX: no thrush or ulceration; NECK: supple; no lymph nodes felt. LYMPH:  no palpable lymphadenopathy in the axillary  or inguinal regions LUNGS: Decreased breath sounds auscultation bilaterally. No wheeze or crackles HEART/CVS: regular rate & rhythm and no murmurs; No lower extremity edema ABDOMEN:abdomen soft, non-tender and normal bowel sounds. No hepatomegaly or splenomegaly.  Musculoskeletal:no cyanosis of digits and no clubbing  PSYCH: alert & oriented x 3 with fluent speech NEURO: no focal motor/sensory deficits SKIN:  no rashes or significant lesions    LABORATORY DATA:  I have reviewed the data as listed    Component Value Date/Time   NA 137 09/16/2017 0510   K 4.0 09/16/2017 0510   CL 103 09/16/2017 0510   CO2 28 09/16/2017 0510   GLUCOSE 120 (H) 09/16/2017 0510   BUN 12 09/16/2017 0510   CREATININE 0.58 09/16/2017 0510   CALCIUM 8.5 (L) 09/16/2017 0510   PROT 7.4 08/20/2017 0954   ALBUMIN 4.3 08/20/2017 0954   AST 21 08/20/2017 0954   ALT 18 08/20/2017 0954   ALKPHOS 60 08/20/2017 0954   BILITOT 0.5 08/20/2017 0954   GFRNONAA >60 09/16/2017 0510   GFRAA >60 09/16/2017 0510    No results found for: SPEP, UPEP  Lab Results  Component Value Date   WBC 9.8 09/16/2017   NEUTROABS 2.4 08/20/2017   HGB 10.7 (L) 09/16/2017   HCT 30.9 (L) 09/16/2017   MCV 100.5 (H) 09/16/2017   PLT 204 09/16/2017      Chemistry      Component Value Date/Time   NA 137 09/16/2017 0510   K 4.0 09/16/2017 0510   CL 103 09/16/2017 0510   CO2 28 09/16/2017 0510   BUN 12 09/16/2017 0510   CREATININE 0.58 09/16/2017 0510      Component Value Date/Time   CALCIUM 8.5 (L) 09/16/2017 0510   ALKPHOS 60 08/20/2017 0954   AST 21 08/20/2017 0954   ALT 18 08/20/2017 0954   BILITOT 0.5 08/20/2017 0954       RADIOGRAPHIC STUDIES: I have personally reviewed the radiological images as listed and agreed with the findings in the report. No results found.   ASSESSMENT & PLAN:  Malignant neoplasm of upper-inner quadrant of right breast in female, estrogen receptor negative ([place] #Stage I triple  negative breast cancer-status post mastectomy; sentinel lymph node biopsy.  Post surgery pathology shows 6 mm residual malignancy; 3 sentinel lymph nodes negative.  #Long discussion with the patient and mother regarding the overall good response from the chemotherapy; but unfortunately absence of complete pathologic response.  #I reviewed the data from create X trial-the patient's with residual disease post neoadjuvant chemotherapy with Xeloda had  improvement of disease-free survival compared to placebo; also a small survival advantage.   #Given patient's young age; being motivated-I would recommend Xeloda thousand milligrams twice daily 2 weeks on 1 week off.  I discussed the potential side effects of Xeloda including but not limited to diarrhea mucositis hand-foot syndrome.  Discussed the prophylactic measures.  #Patient follow-up with me in approximately 4 weeks; labs; proceed with Xeloda at that time.  #Follow-up in approximately 4 weeks labs; discussed with Holly Parker.    Orders Placed This Encounter  Procedures  . CBC with Differential    Standing Status:   Future    Standing Expiration Date:   10/02/2018  . Comprehensive metabolic panel    Standing Status:   Future    Standing Expiration Date:   10/02/2018   All questions were answered. The patient knows to call the clinic with any problems, questions or concerns.      Cammie Sickle, MD 10/01/2017 10:36 PM

## 2017-10-06 ENCOUNTER — Telehealth: Payer: Self-pay | Admitting: Pharmacist

## 2017-10-06 DIAGNOSIS — C50211 Malignant neoplasm of upper-inner quadrant of right female breast: Secondary | ICD-10-CM

## 2017-10-06 DIAGNOSIS — Z171 Estrogen receptor negative status [ER-]: Secondary | ICD-10-CM

## 2017-10-07 ENCOUNTER — Telehealth: Payer: Self-pay | Admitting: Pharmacy Technician

## 2017-10-07 MED ORDER — CAPECITABINE 500 MG PO TABS: 1500.0000 mg | ORAL_TABLET | Freq: Two times a day (BID) | ORAL | 5 refills | Status: DC

## 2017-10-07 MED ORDER — CAPECITABINE 150 MG PO TABS: 300.0000 mg | ORAL_TABLET | Freq: Two times a day (BID) | ORAL | 5 refills | Status: DC

## 2017-10-07 NOTE — Telephone Encounter (Signed)
Oral Oncology Pharmacist Encounter  Received new prescription for Xeloda (capecitabine) for the treatment of stage I triple negative breast cancer.  BMP from 09/16/17 assessed, no relevant lab abnormalities. Prescription dose and frequency assessed.   Current medication list in Epic reviewed, no relevant DDIs with Xeloda identified.  Prescription has been e-scribed to the [facility] for benefits analysis and approval.  Oral Oncology Clinic will continue to follow for insurance authorization, copayment issues, initial counseling and start date.  Darl Pikes, PharmD, BCPS Hematology/Oncology Clinical Pharmacist [facility] [phone]  10/07/2017 8:31 AM

## 2017-10-07 NOTE — Telephone Encounter (Signed)
Oral Oncology Patient Advocate Encounter  No prior authorization needed throught [employer] to fill Xeloda prescriptions.   [place] Patient Holly Parker Phone [phone] Fax [phone] 10/07/2017 10:58 AM

## 2017-10-09 NOTE — Telephone Encounter (Signed)
Oral Chemotherapy Pharmacist Encounter   Spoke with patient on the phone today and provided her with a brief overview of Xeloda. Will provide her with a detailed education following her next in office appt prior to starting her Xeloda. Patient stated her understanding.   Darl Pikes, PharmD, BCPS Hematology/Oncology Clinical Pharmacist [facility] [phone]  10/09/2017 11:24 AM

## 2017-10-15 ENCOUNTER — Ambulatory Visit: Admitting: Internal Medicine

## 2017-10-15 ENCOUNTER — Encounter

## 2017-10-16 ENCOUNTER — Other Ambulatory Visit: Payer: Self-pay | Admitting: *Deleted

## 2017-10-16 MED ORDER — SERTRALINE HCL 100 MG PO TABS: 100.0000 mg | ORAL_TABLET | Freq: Every day | ORAL | 4 refills | Status: AC

## 2017-10-16 MED ORDER — SERTRALINE HCL 100 MG PO TABS: 100.0000 mg | ORAL_TABLET | Freq: Every day | ORAL | 60 refills | Status: DC

## 2017-10-16 NOTE — Telephone Encounter (Addendum)
Holly Parker, will you approve the zoloft. Pt is on chemotherapy -  Xeloda -  Thanks. Nira Conn

## 2017-10-16 NOTE — Telephone Encounter (Signed)
Yes

## 2017-10-16 NOTE — Telephone Encounter (Signed)
NP approved zoloft RF

## 2017-10-16 NOTE — Telephone Encounter (Signed)
Patient called to report that her prescription for Zoloft has expired. Dr. Burlene Arnt  is not the original prescriber, she no longer has a PCP. She is requesting a refill.

## 2017-10-21 ENCOUNTER — Telehealth: Payer: Self-pay | Admitting: Pharmacy Technician

## 2017-10-21 MED FILL — CAPECITABINE 500 MG TABLET: 500 | 14 days supply | Qty: 84 | Fill #0

## 2017-10-21 MED FILL — CAPECITABINE 150 MG TABS: 150 | 14 days supply | Qty: 56 | Fill #0

## 2017-10-21 NOTE — Telephone Encounter (Signed)
Oral Oncology Patient Advocate Encounter  Spoke to patient regarding prescriptions for Xeloda (500 and 150mg ).  [facility] will mail prescriptions out 10/21/17 for delivery 10/22/17.   Will follow up with patient in a couple days to make sure she has received medication and remind her to bring prescriptions to appointment on 10/29/17.  [place] Patient [place] Phone [phone] Fax [phone] 10/21/2017 11:22 AM

## 2017-10-29 ENCOUNTER — Inpatient Hospital Stay: Attending: Internal Medicine

## 2017-10-29 ENCOUNTER — Inpatient Hospital Stay (HOSPITAL_BASED_OUTPATIENT_CLINIC_OR_DEPARTMENT_OTHER): Admitting: Internal Medicine

## 2017-10-29 ENCOUNTER — Other Ambulatory Visit: Payer: Self-pay

## 2017-10-29 ENCOUNTER — Inpatient Hospital Stay

## 2017-10-29 ENCOUNTER — Telehealth: Payer: Self-pay | Admitting: Pharmacist

## 2017-10-29 ENCOUNTER — Encounter: Payer: Self-pay | Admitting: Internal Medicine

## 2017-10-29 VITALS — BP 117/72 | HR 86 | Temp 97.6°F | Resp 18 | Ht 67.0 in | Wt 146.0 lb

## 2017-10-29 DIAGNOSIS — C50211 Malignant neoplasm of upper-inner quadrant of right female breast: Secondary | ICD-10-CM | POA: Insufficient documentation

## 2017-10-29 DIAGNOSIS — Z87891 Personal history of nicotine dependence: Secondary | ICD-10-CM | POA: Diagnosis not present

## 2017-10-29 DIAGNOSIS — Z171 Estrogen receptor negative status [ER-]: Secondary | ICD-10-CM | POA: Insufficient documentation

## 2017-10-29 LAB — COMPREHENSIVE METABOLIC PANEL
ALT: 16 U/L (ref 0–44)
AST: 24 U/L (ref 15–41)
Albumin: 4.6 g/dL (ref 3.5–5.0)
Alkaline Phosphatase: 100 U/L (ref 38–126)
Anion gap: 9 (ref 5–15)
BILIRUBIN TOTAL: 0.5 mg/dL (ref 0.3–1.2)
BUN: 12 mg/dL (ref 6–20)
CHLORIDE: 101 mmol/L (ref 98–111)
CO2: 27 mmol/L (ref 22–32)
CREATININE: 0.8 mg/dL (ref 0.44–1.00)
Calcium: 9.9 mg/dL (ref 8.9–10.3)
GFR calc Af Amer: >60 mL/min (ref >60)
GFR calc non Af Amer: >60 mL/min (ref >60)
Glucose, Bld: 80 mg/dL (ref 70–99)
Potassium: 4.9 mmol/L (ref 3.5–5.1)
Sodium: 137 mmol/L (ref 135–145)
TOTAL PROTEIN: 8.3 g/dL — AB (ref 6.5–8.1)

## 2017-10-29 LAB — CBC WITH DIFFERENTIAL/PLATELET
Basophils Absolute: 0 10*3/uL (ref 0–0.1)
Basophils Relative: 1 %
EOS ABS: 0.3 10*3/uL (ref 0–0.7)
EOS PCT: 5 %
HCT: 37.8 % (ref 35.0–47.0)
Hemoglobin: 13 g/dL (ref 12.0–16.0)
LYMPHS ABS: 1.6 10*3/uL (ref 1.0–3.6)
Lymphocytes Relative: 23 %
MCH: 32.9 pg (ref 26.0–34.0)
MCHC: 34.3 g/dL (ref 32.0–36.0)
MCV: 96 fL (ref 80.0–100.0)
Monocytes Absolute: 0.7 10*3/uL (ref 0.2–0.9)
Monocytes Relative: 10 %
Neutro Abs: 4.3 10*3/uL (ref 1.4–6.5)
Neutrophils Relative %: 61 %
PLATELETS: 261 10*3/uL (ref 150–440)
RBC: 3.94 MIL/uL (ref 3.80–5.20)
RDW: 12.3 % (ref 11.5–14.5)
WBC: 6.9 10*3/uL (ref 3.6–11.0)

## 2017-10-29 NOTE — Telephone Encounter (Signed)
Oral Chemotherapy Pharmacist Encounter  Patient Education I spoke with patient for overview of new oral chemotherapy medication: Xeloda (capecitabine) for the treatment of stage I triple negative breast cancer  Pt is doing well. Counseled patient on administration, dosing, side effects, monitoring, drug-food interactions, safe handling, storage, and disposal. Patient will take 3 tablets (1,500 mg total) by mouth 2 (two) times daily after a meal. Take for 14 days on, then 7 days off. Take with two 150mg  tablets.  Planned start date 11/10/17, following her upcoming beach vacation.  Side effects include but not limited to: diarrhea, hand-foot syndrome, decreased hgb/wbc/plt, N/V, fatigue.    Reviewed with patient importance of keeping a medication schedule and plan for any missed doses.  Holly Parker voiced understanding and appreciation. All questions answered. Medication handout provided and consent obtained. Medication calendar will be updated with new start date and placed in the mail.  Provided patient with [facility] phone number. Patient knows to call the office with questions or concerns. Oral [facility] will continue to follow.  Darl Pikes, PharmD, BCPS, [facility] Hematology/Oncology Clinical Pharmacist [facility] [phone]  10/29/2017 1:48 PM

## 2017-10-29 NOTE — Progress Notes (Signed)
Survivorship Care Plan visit completed.  Treatment summary reviewed and given to patient.  ASCO answers booklet reviewed and given to patient.  CARE program and Cancer Transitions discussed with patient along with other resources cancer center offers to patients and caregivers.  Patient verbalized understanding.    

## 2017-10-29 NOTE — Assessment & Plan Note (Addendum)
#    Stage I triple negative breast cancer; post neoadjuvant chemotherapy-6 mm residual disease.  #Plan to start Xeloda adjuvant 1250 mg/m twice a day 2 weeks on 1 week off for 6-8 cycles.  Again reviewed at length regarding the potential side effects of hand-foot syndrome; discussed at length regarding preventive measures/treatment options including Vaseline other cream urea cream. .   # Follow up on aug 5th 2019/labs-cbc/cmp; patient will start treatment on July 15/vacation.   # 25 minutes face-to-face with the patient discussing the above plan of care; more than 50% of time spent on prognosis/ natural history; counseling and coordination.

## 2017-10-29 NOTE — Progress Notes (Signed)
[place] OFFICE PROGRESS NOTE  Patient Care Team: Patient, No Pcp Per as PCP - General (General Practice) Magrinat, Virgie Dad, MD as Consulting Physician (Oncology) Marga Hoots, MD (Obstetrics and Gynecology) Dillingham, Loel Lofty, DO as Attending Physician (Plastic Surgery) Coralie Keens, MD as Consulting Physician (General Surgery) Cammie Sickle, MD as Consulting Physician (Internal Medicine) Rico Junker, RN as Oncology Nurse Navigator Jovita Kussmaul, MD as Consulting Physician (General Surgery)  Cancer Staging No matching staging information was found for the patient.   Oncology History   # OCT 2018- RIGHT BREAST UIQ -[place]; TRIPLE NEGATIVE; ki-67-high [84m-1'O clock-Bx- proven; [place]]; another- 12'O clock-465m Not biopsied. [s/p Dr.Magrinaut; [place]]; cT1c(m) cN0;  G-3; ki-67- 80%  # 11/218- ddACesolved;  x4 cyles- US/ammo- improved 29m65mothe leison-r-Taxol+carbo [finished ealry April 2019]  # MAY 2019- s/p Mastec& SLNBx [Dr.Toth; [place]]; 6mm36msidual ca; 3 SLN-NEG [ypT1bsnN0]  # BRCA-1 Gene mutated [mom-ovarian cancer-Stage IIIB/Brca; older sister- breast ca/brca-mutated]  # MUGA scan [nov [id number]]%; UPBEAT clinical trial; port explanted.  --------------------------------------------------------   DIAGNOSIS: TRIPLE NEGATIVE BREAST CA  STAGE:  I   ; GOALS: CURATIVE  CURRENT/MOST RECENT THERAPY; Xeloda       Malignant neoplasm of upper-inner quadrant of right breast in female, estrogen receptor negative (HCC)[place]   INTERVAL HISTORY:  Holly Parker.  female pleasant patient above history of triple negative breast cancer status post neoadjuvant therapy followed by surgery is here for follow-up.  Patient is supposed to start Xeloda given her tiny residual disease.  Patient is recovering well from surgery.  She has gotten her bottles of Xeloda.   Review of Systems  Constitutional: Negative for chills, diaphoresis, fever,  malaise/fatigue and weight loss.  HENT: Negative for nosebleeds and sore throat.   Eyes: Negative for double vision.  Respiratory: Negative for cough, hemoptysis, sputum production, shortness of breath and wheezing.   Cardiovascular: Negative for chest pain, palpitations, orthopnea and leg swelling.  Gastrointestinal: Negative for abdominal pain, blood in stool, constipation, diarrhea, heartburn, melena, nausea and vomiting.  Genitourinary: Negative for dysuria, frequency and urgency.  Musculoskeletal: Negative for back pain and joint pain.  Skin: Negative.  Negative for itching and rash.  Neurological: Negative for dizziness, tingling, focal weakness, weakness and headaches.  Endo/Heme/Allergies: Does not bruise/bleed easily.  Psychiatric/Behavioral: Negative for depression. The patient is not nervous/anxious and does not have insomnia.       PAST MEDICAL HISTORY :  Past Medical History:  Diagnosis Date  . Anemia    iron deficiency during chemotherapy  . Anxiety   . Asthma    Excercise induced as a child  . Fibromyalgia   . Headache    migraine headaches due to sinus/allergies and teeth grinding at night  . Malignant neoplasm of upper-inner quadrant of right breast in female, estrogen receptor negative (HCC)[id number]   right breast  . Personal history of chemotherapy    Right breast- 4 treatments    PAST SURGICAL HISTORY :   Past Surgical History:  Procedure Laterality Date  . BREAST RECONSTRUCTION WITH PLACEMENT OF TISSUE EXPANDER AND FLEX HD (ACELLULAR HYDRATED DERMIS) Bilateral 09/15/2017   Procedure: BREAST RECONSTRUCTION WITH PLACEMENT OF TISSUE EXPANDER AND FLEX HD (ACELLULAR HYDRATED DERMIS);  Surgeon: DillWallace Going;  Location: [facility] ORS;  Service: Plastics;  Laterality: Bilateral;  . IR IMAGING GUIDED PORT INSERTION  03/17/2017  . KNEE SURGERY Left 2009   Torn meniscus   . NIPPLE  SPARING MASTECTOMY/SENTINAL LYMPH NODE BIOPSY/RECONSTRUCTION/PLACEMENT OF TISSUE  EXPANDER Bilateral 09/15/2017   Procedure: BILATERAL NIPPLE SPARING MASTECTOMIES  WITH RIGHT  SENTINAL LYMPH NODE BIOPSY;  Surgeon: Jovita Kussmaul, MD;  Location: [facility] OR[facility];  Service: General;  Laterality: Bilateral;  . PORT-A-CATH REMOVAL N/A 09/15/2017   Procedure: REMOVAL PORT-A-CATH;  Surgeon: Jovita Kussmaul, MD;  Location: [facility] ORS;  Service: General;  Laterality: N/A;  . RHINOPLASTY  [id number]   both surgeries due to broken nose    FAMILY HISTORY :   Family History  Problem Relation Age of Onset  . Cancer Mother        Ovarian cancer (2003), breast cancer(2017) & squamous cell   . Leukemia Mother   . Breast cancer Mother 64  . Cancer Sister        breast cancer  . Breast cancer Sister 15  . Melanoma Father   . Lung cancer Paternal Grandmother     SOCIAL HISTORY:   Social History   Tobacco Use  . Smoking status: Former Smoker    Packs/day: 0.25    Types: Cigarettes    Last attempt to quit: 09/16/2011    Years since quitting: 6.1  . Smokeless tobacco: Never Used  Substance Use Topics  . Alcohol use: No  . Drug use: No    ALLERGIES:  is allergic to seasonal ic [cholestatin].  MEDICATIONS:  Current Outpatient Medications  Medication Sig Dispense Refill  . cromolyn (NASALCROM) 5.2 MG/ACT nasal spray Place 1 spray into both nostrils 4 (four) times daily as needed for allergies or rhinitis. 26 mL 0  . diazepam (VALIUM) 2 MG tablet Take 1 tablet (2 mg total) by mouth every 8 (eight) hours as needed for muscle spasms. 30 tablet 0  . loratadine (CLARITIN) 10 MG tablet Take 10 mg daily by mouth.    . Multiple Vitamin (MULTIVITAMIN WITH MINERALS) TABS tablet Take 1 tablet by mouth daily. One-A-Day    . naproxen (NAPROSYN) 500 MG tablet Take 1 tablet (500 mg total) by mouth 2 (two) times daily as needed for mild pain.    Marland Kitchen ondansetron (ZOFRAN) 4 MG tablet Take 1 tablet (4 mg total) by mouth daily as needed for nausea or vomiting. 10 tablet 1  . sertraline (ZOLOFT) 100 MG tablet  Take 1 tablet (100 mg total) by mouth at bedtime. 30 tablet 4  . acetaminophen (TYLENOL) 325 MG tablet Take 1 tablet (325 mg total) by mouth every 6 (six) hours. (Patient not taking: Reported on 10/29/2017)    . capecitabine (XELODA) 150 MG tablet Take 2 tablets (300 mg total) by mouth 2 (two) times daily after a meal. Take for 14 days on, then 7 days off. Take with three 523m tablets (Patient not taking: Reported on 10/29/2017) 56 tablet 5  . capecitabine (XELODA) 500 MG tablet Take 3 tablets (1,500 mg total) by mouth 2 (two) times daily after a meal. Take for 14 days on, then 7 days off. Take with two 1535mtablets (Patient not taking: Reported on 10/29/2017) 84 tablet 5  . diphenhydrAMINE (BENADRYL) 12.5 MG/5ML elixir Take 5 mLs (12.5 mg total) by mouth every 6 (six) hours as needed for itching. 120 mL 0  . fluticasone (FLONASE) 50 MCG/ACT nasal spray Place 1 spray into both nostrils daily as needed for allergies.    . Marland KitchenYDROcodone-acetaminophen (NORCO/VICODIN) 5-325 MG tablet Take 1 tablet by mouth every 6 (six) hours as needed.    . polyethylene glycol (MIRALAX / GLYCOLAX) packet Take 17 g  by mouth daily as needed for mild constipation. (Patient not taking: Reported on 10/29/2017) 14 each 0  . pseudoephedrine-acetaminophen (TYLENOL SINUS) 30-500 MG TABS tablet Take 1-2 tablets by mouth every 4 (four) hours as needed (for congestion/allergies.).    Marland Kitchen senna (SENOKOT) 8.6 MG TABS tablet Take 1 tablet (8.6 mg total) by mouth 2 (two) times daily. (Patient not taking: Reported on 10/29/2017) 120 each 0   No current facility-administered medications for this visit.     PHYSICAL EXAMINATION: ECOG PERFORMANCE STATUS: 0 - Asymptomatic  BP 117/72 (Patient Position: Sitting)   Pulse 86   Temp 97.6 F (36.4 C) (Tympanic)   Resp 18   Ht _0  (1.702 m)   Wt 146 lb (66.2 kg)   BMI 22.87 kg/m   Filed Weights   10/29/17 1155  Weight: 146 lb (66.2 kg)    GENERAL: Well-nourished well-developed; Alert, no  distress and comfortable.  Alone.  EYES: no pallor or icterus OROPHARYNX: no thrush or ulceration; NECK: supple; no lymph nodes felt. LYMPH:  no palpable lymphadenopathy in the axillary or inguinal regions LUNGS: Decreased breath sounds auscultation bilaterally. No wheeze or crackles HEART/CVS: regular rate & rhythm and no murmurs; No lower extremity edema ABDOMEN:abdomen soft, non-tender and normal bowel sounds. No hepatomegaly or splenomegaly.  Musculoskeletal:no cyanosis of digits and no clubbing  PSYCH: alert & oriented x 3 with fluent speech NEURO: no focal motor/sensory deficits SKIN:  no rashes or significant lesions    LABORATORY DATA:  I have reviewed the data as listed    Component Value Date/Time   NA 137 10/29/2017 1120   K 4.9 10/29/2017 1120   CL 101 10/29/2017 1120   CO2 27 10/29/2017 1120   GLUCOSE 80 10/29/2017 1120   BUN 12 10/29/2017 1120   CREATININE 0.80 10/29/2017 1120   CALCIUM 9.9 10/29/2017 1120   PROT 8.3 (H) 10/29/2017 1120   ALBUMIN 4.6 10/29/2017 1120   AST 24 10/29/2017 1120   ALT 16 10/29/2017 1120   ALKPHOS 100 10/29/2017 1120   BILITOT 0.5 10/29/2017 1120   GFRNONAA >60 10/29/2017 1120   GFRAA >60 10/29/2017 1120    No results found for: SPEP, UPEP  Lab Results  Component Value Date   WBC 6.9 10/29/2017   NEUTROABS 4.3 10/29/2017   HGB 13.0 10/29/2017   HCT 37.8 10/29/2017   MCV 96.0 10/29/2017   PLT 261 10/29/2017      Chemistry      Component Value Date/Time   NA 137 10/29/2017 1120   K 4.9 10/29/2017 1120   CL 101 10/29/2017 1120   CO2 27 10/29/2017 1120   BUN 12 10/29/2017 1120   CREATININE 0.80 10/29/2017 1120      Component Value Date/Time   CALCIUM 9.9 10/29/2017 1120   ALKPHOS 100 10/29/2017 1120   AST 24 10/29/2017 1120   ALT 16 10/29/2017 1120   BILITOT 0.5 10/29/2017 1120       RADIOGRAPHIC STUDIES: I have personally reviewed the radiological images as listed and agreed with the findings in the  report. No results found.   ASSESSMENT & PLAN:  Malignant neoplasm of upper-inner quadrant of right breast in female, estrogen receptor negative ([place]) #  Stage I triple negative breast cancer; post neoadjuvant chemotherapy-6 mm residual disease.  #Plan to start Xeloda adjuvant 1250 mg/m twice a day 2 weeks on 1 week off for 6-8 cycles.  Again reviewed at length regarding the potential side effects of hand-foot syndrome; discussed at length  regarding preventive measures/treatment options including Vaseline other cream urea cream. .   # Follow up on aug 5th 2019/labs-cbc/cmp; patient will start treatment on July 15/vacation.   # 25 minutes face-to-face with the patient discussing the above plan of care; more than 50% of time spent on prognosis/ natural history; counseling and coordination.    No orders of the defined types were placed in this encounter.  All questions were answered. The patient knows to call the clinic with any problems, questions or concerns.      Cammie Sickle, MD 10/30/2017 2:23 PM

## 2017-11-06 ENCOUNTER — Other Ambulatory Visit: Payer: Self-pay | Admitting: Oncology

## 2017-11-25 ENCOUNTER — Telehealth: Payer: Self-pay | Admitting: *Deleted

## 2017-11-25 NOTE — Telephone Encounter (Signed)
Patient reports that she has been unable to sleep for several weeks and is asking for medicine to help her sleep. Please advise

## 2017-11-25 NOTE — Telephone Encounter (Signed)
Patient spoke with Dr. Rogue Bussing. md asked patient to take melatonin 5 mg at bedtime.

## 2017-11-25 NOTE — Telephone Encounter (Signed)
Pt c/o increasing insomnia since starting her on requip. Several months ago, her pcp started her on requip - 1/2 tablet to 1 whole tablet for restless leg. She used the tablet for 2-3 days and then had to stop due to insomnia. The patient's insomnia has "actually worsen since that point. I"m not sleeping at all. I'm very frustrated. I don't know what else to do at this point." pt would like to have this concern addressed before her apt on Monday.

## 2017-11-26 ENCOUNTER — Encounter

## 2017-11-26 MED FILL — CAPECITABINE 500 MG TABLET: 500 | 14 days supply | Qty: 84 | Fill #1

## 2017-11-26 MED FILL — CAPECITABINE 150 MG TABS: 150 | 14 days supply | Qty: 56 | Fill #1

## 2017-11-26 NOTE — Telephone Encounter (Signed)
Hassan Rowan, this was handled yesterday. pls see previous phone note

## 2017-12-01 ENCOUNTER — Inpatient Hospital Stay: Attending: Internal Medicine

## 2017-12-01 ENCOUNTER — Inpatient Hospital Stay (HOSPITAL_BASED_OUTPATIENT_CLINIC_OR_DEPARTMENT_OTHER): Admitting: Internal Medicine

## 2017-12-01 VITALS — BP 104/70 | HR 89 | Temp 97.4°F | Resp 16

## 2017-12-01 DIAGNOSIS — Z171 Estrogen receptor negative status [ER-]: Secondary | ICD-10-CM | POA: Insufficient documentation

## 2017-12-01 DIAGNOSIS — M2550 Pain in unspecified joint: Secondary | ICD-10-CM | POA: Insufficient documentation

## 2017-12-01 DIAGNOSIS — G4700 Insomnia, unspecified: Secondary | ICD-10-CM

## 2017-12-01 DIAGNOSIS — C50211 Malignant neoplasm of upper-inner quadrant of right female breast: Secondary | ICD-10-CM

## 2017-12-01 DIAGNOSIS — L239 Allergic contact dermatitis, unspecified cause: Secondary | ICD-10-CM | POA: Insufficient documentation

## 2017-12-01 DIAGNOSIS — Z87891 Personal history of nicotine dependence: Secondary | ICD-10-CM | POA: Diagnosis not present

## 2017-12-01 LAB — CBC WITH DIFFERENTIAL/PLATELET
BASOS PCT: 1 %
Basophils Absolute: 0 10*3/uL (ref 0–0.1)
EOS ABS: 0.3 10*3/uL (ref 0–0.7)
EOS PCT: 6 %
HCT: 37.9 % (ref 35.0–47.0)
Hemoglobin: 13.1 g/dL (ref 12.0–16.0)
LYMPHS ABS: 1.5 10*3/uL (ref 1.0–3.6)
Lymphocytes Relative: 28 %
MCH: 32.7 pg (ref 26.0–34.0)
MCHC: 34.6 g/dL (ref 32.0–36.0)
MCV: 94.6 fL (ref 80.0–100.0)
MONOS PCT: 9 %
Monocytes Absolute: 0.5 10*3/uL (ref 0.2–0.9)
Neutro Abs: 3 10*3/uL (ref 1.4–6.5)
Neutrophils Relative %: 56 %
Platelets: 231 10*3/uL (ref 150–440)
RBC: 4.01 MIL/uL (ref 3.80–5.20)
RDW: 12.8 % (ref 11.5–14.5)
WBC: 5.3 10*3/uL (ref 3.6–11.0)

## 2017-12-01 LAB — COMPREHENSIVE METABOLIC PANEL
ALBUMIN: 4.3 g/dL (ref 3.5–5.0)
ALK PHOS: 99 U/L (ref 38–126)
ALT: 13 U/L (ref 0–44)
ANION GAP: 10 (ref 5–15)
AST: 24 U/L (ref 15–41)
BUN: 13 mg/dL (ref 6–20)
CALCIUM: 9.5 mg/dL (ref 8.9–10.3)
CO2: 22 mmol/L (ref 22–32)
Chloride: 102 mmol/L (ref 98–111)
Creatinine, Ser: 0.73 mg/dL (ref 0.44–1.00)
GFR calc non Af Amer: >60 mL/min (ref >60)
GLUCOSE: 125 mg/dL — AB (ref 70–99)
POTASSIUM: 3.8 mmol/L (ref 3.5–5.1)
SODIUM: 134 mmol/L — AB (ref 135–145)
TOTAL PROTEIN: 7.8 g/dL (ref 6.5–8.1)
Total Bilirubin: 0.6 mg/dL (ref 0.3–1.2)

## 2017-12-01 MED ORDER — ZOLPIDEM TARTRATE 5 MG PO TABS: 5.0000 mg | ORAL_TABLET | Freq: Every evening | ORAL | 0 refills | Status: DC | PRN

## 2017-12-01 NOTE — Assessment & Plan Note (Addendum)
#    Stage I triple negative breast cancer; post neoadjuvant chemotherapy-6 mm residual disease.  On adjuvant Xeloda.  Clinically stable.  #Xeloda 2 weeks on; 1 week OFF- starting cycle #2 today. Labs today reviewed;  acceptable for treatment today.   # hands and feet joint pains- early AM unclear etiology monitor for now.  #Insomnia-recommend Ambien; scription given.  # will check re: clinical trial ? PARP.   # follow up in 3 week/alsb

## 2017-12-01 NOTE — Progress Notes (Signed)
[facility] OFFICE PROGRESS NOTE  Patient Care Team: Patient, No Pcp Per as PCP - General (General Practice) Magrinat, Virgie Dad, MD as Consulting Physician (Oncology) Marga Hoots, MD (Obstetrics and Gynecology) Dillingham, Loel Lofty, DO as Attending Physician (Plastic Surgery) Coralie Keens, MD as Consulting Physician (General Surgery) Cammie Sickle, MD as Consulting Physician (Internal Medicine) Rico Junker, RN as Oncology Nurse Navigator Jovita Kussmaul, MD as Consulting Physician (General Surgery)  Cancer Staging No matching staging information was found for the patient.   Oncology History   # OCT 2018- RIGHT BREAST UIQ -Ona; TRIPLE NEGATIVE; ki-67-high [39m-1'O clock-Bx- proven; [place]]; another- 12'O clock-458m Not biopsied. [s/p Dr.Magrinaut; [place]]; cT1c(m) cN0;  G-3; ki-67- 80%  # 11/218- ddACesolved;  x4 cyles- US/ammo- improved 38m66mothe leison-r-Taxol+carbo [finished ealry April 2019]  # MAY 2019- s/p Mastec& SLNBx [Dr.Toth; [place]]; 6mm34msidual ca; 3 SLN-NEG [ypT1bsnN0]  # BRCA-1 Gene mutated [mom-ovarian cancer-Stage IIIB/Brca; older sister- breast ca/brca-mutated]  # MUGA scan [nov [id number]]%; UPBEAT clinical trial; port explanted.  --------------------------------------------------------   DIAGNOSIS: TRIPLE NEGATIVE BREAST CA  STAGE:  I   ; GOALS: CURATIVE  CURRENT/MOST RECENT THERAPY; Xeloda       Malignant neoplasm of upper-inner quadrant of right breast in female, estrogen receptor negative (HCC)[place]   INTERVAL HISTORY:  Holly MILLIGANy18.  female pleasant patient above history of stage I triple negative breast cancer currently on adjuvant Xeloda is here for follow-up.  Patient complains of mild to moderate small joint aches and pains especially in the morning.  It improves with rest of the day.  Denies any rash on palms or toes.  No diarrhea.  No sores in the mouth.  Mild fatigue.  Difficulty sleeping.  Review  of Systems  Constitutional: Positive for malaise/fatigue. Negative for chills, diaphoresis, fever and weight loss.  HENT: Negative for nosebleeds and sore throat.   Eyes: Negative for double vision.  Respiratory: Negative for cough, hemoptysis, sputum production, shortness of breath and wheezing.   Cardiovascular: Negative for chest pain, palpitations, orthopnea and leg swelling.  Gastrointestinal: Negative for abdominal pain, blood in stool, constipation, diarrhea, heartburn, melena, nausea and vomiting.  Genitourinary: Negative for dysuria, frequency and urgency.  Musculoskeletal: Positive for joint pain. Negative for back pain.  Skin: Negative.  Negative for itching and rash.  Neurological: Negative for dizziness, tingling, focal weakness, weakness and headaches.  Endo/Heme/Allergies: Does not bruise/bleed easily.  Psychiatric/Behavioral: Negative for depression. The patient has insomnia. The patient is not nervous/anxious.       PAST MEDICAL HISTORY :  Past Medical History:  Diagnosis Date  . Anemia    iron deficiency during chemotherapy  . Anxiety   . Asthma    Excercise induced as a child  . Fibromyalgia   . Headache    migraine headaches due to sinus/allergies and teeth grinding at night  . Malignant neoplasm of upper-inner quadrant of right breast in female, estrogen receptor negative (HCC)[place]   right breast  . Personal history of chemotherapy    Right breast- 4 treatments    PAST SURGICAL HISTORY :   Past Surgical History:  Procedure Laterality Date  . BREAST RECONSTRUCTION WITH PLACEMENT OF TISSUE EXPANDER AND FLEX HD (ACELLULAR HYDRATED DERMIS) Bilateral 09/15/2017   Procedure: BREAST RECONSTRUCTION WITH PLACEMENT OF TISSUE EXPANDER AND FLEX HD (ACELLULAR HYDRATED DERMIS);  Surgeon: DillWallace Going;  Location: [facility] OR[facility];  Service: Plastics;  Laterality: Bilateral;  . IR  IMAGING GUIDED PORT INSERTION  03/17/2017  . KNEE SURGERY Left 2009   Torn meniscus   .  NIPPLE SPARING MASTECTOMY/SENTINAL LYMPH NODE BIOPSY/RECONSTRUCTION/PLACEMENT OF TISSUE EXPANDER Bilateral 09/15/2017   Procedure: BILATERAL NIPPLE SPARING MASTECTOMIES  WITH RIGHT  SENTINAL LYMPH NODE BIOPSY;  Surgeon: Jovita Kussmaul, MD;  Location: [facility] OR[facility];  Service: General;  Laterality: Bilateral;  . PORT-A-CATH REMOVAL N/A 09/15/2017   Procedure: REMOVAL PORT-A-CATH;  Surgeon: Jovita Kussmaul, MD;  Location: [facility] OR[facility];  Service: General;  Laterality: N/A;  . RHINOPLASTY  [id number]   both surgeries due to broken nose    FAMILY HISTORY :   Family History  Problem Relation Age of Onset  . Cancer Mother        Ovarian cancer (2003), breast cancer(2017) & squamous cell   . Leukemia Mother   . Breast cancer Mother 56  . Cancer Sister        breast cancer  . Breast cancer Sister 8  . Melanoma Father   . Lung cancer Paternal Grandmother     SOCIAL HISTORY:   Social History   Tobacco Use  . Smoking status: Former Smoker    Packs/day: 0.25    Types: Cigarettes    Last attempt to quit: 09/16/2011    Years since quitting: 6.2  . Smokeless tobacco: Never Used  Substance Use Topics  . Alcohol use: No  . Drug use: No    ALLERGIES:  is allergic to seasonal ic [cholestatin].  MEDICATIONS:  Current Outpatient Medications  Medication Sig Dispense Refill  . acetaminophen (TYLENOL) 325 MG tablet Take 1 tablet (325 mg total) by mouth every 6 (six) hours.    . capecitabine (XELODA) 150 MG tablet Take 2 tablets (300 mg total) by mouth 2 (two) times daily after a meal. Take for 14 days on, then 7 days off. Take with three 512m tablets 56 tablet 5  . capecitabine (XELODA) 500 MG tablet Take 3 tablets (1,500 mg total) by mouth 2 (two) times daily after a meal. Take for 14 days on, then 7 days off. Take with two 1563mtablets 84 tablet 5  . cromolyn (NASALCROM) 5.2 MG/ACT nasal spray Place 1 spray into both nostrils 4 (four) times daily as needed for allergies or rhinitis. 26 mL 0  . diazepam  (VALIUM) 2 MG tablet Take 1 tablet (2 mg total) by mouth every 8 (eight) hours as needed for muscle spasms. 30 tablet 0  . diphenhydrAMINE (BENADRYL) 12.5 MG/5ML elixir Take 5 mLs (12.5 mg total) by mouth every 6 (six) hours as needed for itching. 120 mL 0  . fluticasone (FLONASE) 50 MCG/ACT nasal spray Place 1 spray into both nostrils daily as needed for allergies.    . Marland KitchenYDROcodone-acetaminophen (NORCO/VICODIN) 5-325 MG tablet Take 1 tablet by mouth every 6 (six) hours as needed.    . loratadine (CLARITIN) 10 MG tablet Take 10 mg daily by mouth.    . Multiple Vitamin (MULTIVITAMIN WITH MINERALS) TABS tablet Take 1 tablet by mouth daily. One-A-Day    . naproxen (NAPROSYN) 500 MG tablet Take 1 tablet (500 mg total) by mouth 2 (two) times daily as needed for mild pain.    . Marland Kitchenndansetron (ZOFRAN) 4 MG tablet Take 1 tablet (4 mg total) by mouth daily as needed for nausea or vomiting. 10 tablet 1  . polyethylene glycol (MIRALAX / GLYCOLAX) packet Take 17 g by mouth daily as needed for mild constipation. 14 each 0  . pseudoephedrine-acetaminophen (TYLENOL SINUS) 30-500 MG  TABS tablet Take 1-2 tablets by mouth every 4 (four) hours as needed (for congestion/allergies.).    Marland Kitchen senna (SENOKOT) 8.6 MG TABS tablet Take 1 tablet (8.6 mg total) by mouth 2 (two) times daily. 120 each 0  . sertraline (ZOLOFT) 100 MG tablet Take 1 tablet (100 mg total) by mouth at bedtime. 30 tablet 4  . predniSONE (STERAPRED UNI-PAK 21 TAB) 10 MG (21) TBPK tablet Take as prescribed 21 tablet 0  . triamcinolone ointment (KENALOG) 0.5 % Apply 1 application topically 2 (two) times daily. 30 g 0  . urea (CARMOL) 10 % cream Apply topically as needed. 71 g 0  . zolpidem (AMBIEN) 5 MG tablet Take 1 tablet (5 mg total) by mouth at bedtime as needed for sleep. 30 tablet 0   No current facility-administered medications for this visit.     PHYSICAL EXAMINATION: ECOG PERFORMANCE STATUS: 0 - Asymptomatic  BP 104/70 (BP Location: Left Arm,  Patient Position: Sitting)   Pulse 89   Temp (!) 97.4 F (36.3 C) (Tympanic)   Resp 16   There were no vitals filed for this visit.  GENERAL: Well-nourished well-developed; Alert, no distress and comfortable.  Alone. EYES: no pallor or icterus OROPHARYNX: no thrush or ulceration; NECK: supple; no lymph nodes felt. LYMPH:  no palpable lymphadenopathy in the axillary or inguinal regions LUNGS: Decreased breath sounds auscultation bilaterally. No wheeze or crackles HEART/CVS: regular rate & rhythm and no murmurs; No lower extremity edema ABDOMEN:abdomen soft, non-tender and normal bowel sounds. No hepatomegaly or splenomegaly.  Musculoskeletal:no cyanosis of digits and no clubbing  PSYCH: alert & oriented x 3 with fluent speech NEURO: no focal motor/sensory deficits SKIN:  no rashes or significant lesions    LABORATORY DATA:  I have reviewed the data as listed    Component Value Date/Time   NA 134 (L) 12/01/2017 1108   K 3.8 12/01/2017 1108   CL 102 12/01/2017 1108   CO2 22 12/01/2017 1108   GLUCOSE 125 (H) 12/01/2017 1108   BUN 13 12/01/2017 1108   CREATININE 0.73 12/01/2017 1108   CALCIUM 9.5 12/01/2017 1108   PROT 7.8 12/01/2017 1108   ALBUMIN 4.3 12/01/2017 1108   AST 24 12/01/2017 1108   ALT 13 12/01/2017 1108   ALKPHOS 99 12/01/2017 1108   BILITOT 0.6 12/01/2017 1108   GFRNONAA >60 12/01/2017 1108   GFRAA >60 12/01/2017 1108    No results found for: SPEP, UPEP  Lab Results  Component Value Date   WBC 5.3 12/01/2017   NEUTROABS 3.0 12/01/2017   HGB 13.1 12/01/2017   HCT 37.9 12/01/2017   MCV 94.6 12/01/2017   PLT 231 12/01/2017      Chemistry      Component Value Date/Time   NA 134 (L) 12/01/2017 1108   K 3.8 12/01/2017 1108   CL 102 12/01/2017 1108   CO2 22 12/01/2017 1108   BUN 13 12/01/2017 1108   CREATININE 0.73 12/01/2017 1108      Component Value Date/Time   CALCIUM 9.5 12/01/2017 1108   ALKPHOS 99 12/01/2017 1108   AST 24 12/01/2017 1108    ALT 13 12/01/2017 1108   BILITOT 0.6 12/01/2017 1108       RADIOGRAPHIC STUDIES: I have personally reviewed the radiological images as listed and agreed with the findings in the report. No results found.   ASSESSMENT & PLAN:  Malignant neoplasm of upper-inner quadrant of right breast in female, estrogen receptor negative ([place]) #  Stage I triple  negative breast cancer; post neoadjuvant chemotherapy-6 mm residual disease.  On adjuvant Xeloda.  Clinically stable.  #Xeloda 2 weeks on; 1 week OFF- starting cycle #2 today. Labs today reviewed;  acceptable for treatment today.   # hands and feet joint pains- early AM unclear etiology monitor for now.  #Insomnia-recommend Ambien; scription given.  # will check re: clinical trial ? PARP.   # follow up in 3 week/alsb    Orders Placed This Encounter  Procedures  . CBC with Differential/Platelet    Standing Status:   Future    Standing Expiration Date:   12/02/2018  . Comprehensive metabolic panel    Standing Status:   Future    Standing Expiration Date:   12/02/2018   All questions were answered. The patient knows to call the clinic with any problems, questions or concerns.      Cammie Sickle, MD 12/22/2017 10:10 PM

## 2017-12-17 MED FILL — CAPECITABINE 500 MG TABLET: 500 | 14 days supply | Qty: 84 | Fill #2

## 2017-12-17 MED FILL — CAPECITABINE 150 MG TABS: 150 | 14 days supply | Qty: 56 | Fill #2

## 2017-12-22 ENCOUNTER — Telehealth: Payer: Self-pay | Admitting: *Deleted

## 2017-12-22 ENCOUNTER — Inpatient Hospital Stay (HOSPITAL_BASED_OUTPATIENT_CLINIC_OR_DEPARTMENT_OTHER): Admitting: Oncology

## 2017-12-22 VITALS — BP 117/77 | HR 88 | Resp 16 | Wt 150.0 lb

## 2017-12-22 DIAGNOSIS — Z171 Estrogen receptor negative status [ER-]: Secondary | ICD-10-CM

## 2017-12-22 DIAGNOSIS — C50211 Malignant neoplasm of upper-inner quadrant of right female breast: Secondary | ICD-10-CM

## 2017-12-22 DIAGNOSIS — L239 Allergic contact dermatitis, unspecified cause: Secondary | ICD-10-CM | POA: Diagnosis not present

## 2017-12-22 DIAGNOSIS — R202 Paresthesia of skin: Secondary | ICD-10-CM

## 2017-12-22 MED ORDER — PREDNISONE 10 MG (21) PO TBPK: ORAL_TABLET | ORAL | 0 refills | Status: DC

## 2017-12-22 MED ORDER — UREA 10 % EX CREA: TOPICAL_CREAM | CUTANEOUS | 0 refills | Status: DC | PRN

## 2017-12-22 MED ORDER — TRIAMCINOLONE ACETONIDE 0.5 % EX OINT: 1.0000 "application " | TOPICAL_OINTMENT | Freq: Two times a day (BID) | CUTANEOUS | 0 refills | Status: DC

## 2017-12-22 NOTE — Progress Notes (Signed)
Symptom Management Consult note [facility]  [phone] [phone]  Patient Care Team: Patient, No Pcp Per as PCP - General (General Practice) Magrinat, Virgie Dad, MD as Consulting Physician (Oncology) Marga Hoots, MD (Obstetrics and Gynecology) Dillingham, Loel Lofty, DO as Attending Physician (Plastic Surgery) Coralie Keens, MD as Consulting Physician (General Surgery) Cammie Sickle, MD as Consulting Physician (Internal Medicine) Rico Junker, RN as Oncology Nurse Navigator Jovita Kussmaul, MD as Consulting Physician (General Surgery)   Name of the patient: Holly Parker  [id number]  October 29, 1980    Date of visit: 12/22/17  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 Rogue Bussing on 12/01/2017 for follow-up. S/p neoadjuvant chemotherapy.  Positive for residual disease.  Plan was to begin Xeloda twice daily for 2 weeks on and one-week off for 6-8 cycles.  Discussed side effects. Oncology History   # OCT 2018- RIGHT BREAST UIQ -[place]; TRIPLE NEGATIVE; ki-67-high [66m-1'O clock-Bx- proven; [place]]; another- 12'O clock-429m Not biopsied. [s/p Dr.Magrinaut; [place]]; cT1c(m) cN0;  G-3; ki-67- 80%  # 11/218- ddACesolved;  x4 cyles- US/ammo- improved 17m62mothe leison-r-Taxol+carbo [finished ealry April 2019]  # MAY 2019- s/p Mastec& SLNBx [Dr.Toth; [place]]; 6mm64msidual ca; 3 SLN-NEG [ypT1bsnN0]  # BRCA-1 Gene mutated [mom-ovarian cancer-Stage IIIB/Brca; older sister- breast ca/brca-mutated]  # MUGA scan [nov [id number]]%; UPBEAT clinical trial; port explanted.  --------------------------------------------------------   DIAGNOSIS: TRIPLE NEGATIVE BREAST CA  STAGE:  I   ; GOALS: CURATIVE  CURRENT/MOST RECENT THERAPY; Xeloda       Malignant neoplasm of upper-inner quadrant of right breast in  female, estrogen receptor negative (HCC)   Interval history- Patient complains of rash involving the bilateral arm, neck and trunk. Rash started 10 days ago. Appearance of rash at onset: raised, scattered pruritic leisons. Rash has changed over time Initial distribution: generalized.  Discomfort associated with rash: is pruritic.  Associated symptoms: insomnia. Denies: abdominal pain, congestion, cough, fever, nausea and sore throat. Patient has not had previous evaluation of rash. Patient has not had previous treatment. Patient has not had contacts with similar rash. Patient has not identified precipitant. Patient has had new exposures.  Visited the beach for a week and was exposed to a cat, sunscreen and the sun.  Typical allergic symptoms to cats include sinus problems.    ECOG FS:1 - Symptomatic but completely ambulatory  Review of systems- Review of Systems  Constitutional: Negative.  Negative for chills, fever, malaise/fatigue and weight loss.  HENT: Negative for congestion and ear pain.   Eyes: Negative.  Negative for blurred vision and double vision.  Respiratory: Negative.  Negative for cough, sputum production and shortness of breath.   Cardiovascular: Negative.  Negative for chest pain, palpitations and leg swelling.  Gastrointestinal: Negative.  Negative for abdominal pain, constipation, diarrhea, nausea and vomiting.  Genitourinary: Negative for dysuria, frequency and urgency.  Musculoskeletal: Negative for back pain and falls.  Skin: Positive for itching and rash.  Neurological: Negative.  Negative for weakness and headaches.  Endo/Heme/Allergies: Negative.  Does not bruise/bleed easily.  Psychiatric/Behavioral: Negative.  Negative for depression. The patient is not nervous/anxious and does not have insomnia.      Current treatment- S/p Xeloda PO BID. Scheduled to begin cycle 3 Xeloda today.  Allergies  Allergen Reactions  . Seasonal Ic [Cholestatin] Other (See Comments)     Headaches and sinus infections, runny nose  Past Medical History:  Diagnosis Date  . Anemia    iron deficiency during chemotherapy  . Anxiety   . Asthma    Excercise induced as a child  . Fibromyalgia   . Headache    migraine headaches due to sinus/allergies and teeth grinding at night  . Malignant neoplasm of upper-inner quadrant of right breast in female, estrogen receptor negative ([place]) 2018   right breast  . Personal history of chemotherapy    Right breast- 4 treatments     Past Surgical History:  Procedure Laterality Date  . BREAST RECONSTRUCTION WITH PLACEMENT OF TISSUE EXPANDER AND FLEX HD (ACELLULAR HYDRATED DERMIS) Bilateral 09/15/2017   Procedure: BREAST RECONSTRUCTION WITH PLACEMENT OF TISSUE EXPANDER AND FLEX HD (ACELLULAR HYDRATED DERMIS);  Surgeon: Wallace Going, DO;  Location: [facility];  Service: Plastics;  Laterality: Bilateral;  . IR IMAGING GUIDED PORT INSERTION  03/17/2017  . KNEE SURGERY Left 2009   Torn meniscus   . NIPPLE SPARING MASTECTOMY/SENTINAL LYMPH NODE BIOPSY/RECONSTRUCTION/PLACEMENT OF TISSUE EXPANDER Bilateral 09/15/2017   Procedure: BILATERAL NIPPLE SPARING MASTECTOMIES  WITH RIGHT  SENTINAL LYMPH NODE BIOPSY;  Surgeon: Jovita Kussmaul, MD;  Location: [facility];  Service: General;  Laterality: Bilateral;  . PORT-A-CATH REMOVAL N/A 09/15/2017   Procedure: REMOVAL PORT-A-CATH;  Surgeon: Jovita Kussmaul, MD;  Location: [facility];  Service: General;  Laterality: N/A;  . RHINOPLASTY  1610,9604   both surgeries due to broken nose    Social History   Socioeconomic History  . Marital status: Married    Spouse name: Not on file  . Number of children: Not on file  . Years of education: Not on file  . Highest education level: Not on file  Occupational History  . Not on file  Social Needs  . Financial resource strain: Not on file  . Food insecurity:    Worry: Not on file    Inability: Not on file  . Transportation needs:    Medical: Not on  file    Non-medical: Not on file  Tobacco Use  . Smoking status: Former Smoker    Packs/day: 0.25    Types: Cigarettes    Last attempt to quit: 09/16/2011    Years since quitting: 6.2  . Smokeless tobacco: Never Used  Substance and Sexual Activity  . Alcohol use: No  . Drug use: No  . Sexual activity: Yes  Lifestyle  . Physical activity:    Days per week: Not on file    Minutes per session: Not on file  . Stress: Not on file  Relationships  . Social connections:    Talks on phone: Not on file    Gets together: Not on file    Attends religious service: Not on file    Active member of club or organization: Not on file    Attends meetings of clubs or organizations: Not on file    Relationship status: Not on file  . Intimate partner violence:    Fear of current or ex partner: Not on file    Emotionally abused: Not on file    Physically abused: Not on file    Forced sexual activity: Not on file  Other Topics Concern  . Not on file  Social History Narrative  . Not on file    Family History  Problem Relation Age of Onset  . Cancer Mother        Ovarian cancer (2003), breast cancer(2017) & squamous cell   . Leukemia Mother   .  Breast cancer Mother 39  . Cancer Sister        breast cancer  . Breast cancer Sister 54  . Melanoma Father   . Lung cancer Paternal Grandmother      Current Outpatient Medications:  .  acetaminophen (TYLENOL) 325 MG tablet, Take 1 tablet (325 mg total) by mouth every 6 (six) hours., Disp: , Rfl:  .  capecitabine (XELODA) 150 MG tablet, Take 2 tablets (300 mg total) by mouth 2 (two) times daily after a meal. Take for 14 days on, then 7 days off. Take with three 553m tablets, Disp: 56 tablet, Rfl: 5 .  capecitabine (XELODA) 500 MG tablet, Take 3 tablets (1,500 mg total) by mouth 2 (two) times daily after a meal. Take for 14 days on, then 7 days off. Take with two 1519mtablets, Disp: 84 tablet, Rfl: 5 .  cromolyn (NASALCROM) 5.2 MG/ACT nasal spray,  Place 1 spray into both nostrils 4 (four) times daily as needed for allergies or rhinitis., Disp: 26 mL, Rfl: 0 .  diazepam (VALIUM) 2 MG tablet, Take 1 tablet (2 mg total) by mouth every 8 (eight) hours as needed for muscle spasms., Disp: 30 tablet, Rfl: 0 .  diphenhydrAMINE (BENADRYL) 12.5 MG/5ML elixir, Take 5 mLs (12.5 mg total) by mouth every 6 (six) hours as needed for itching., Disp: 120 mL, Rfl: 0 .  fluticasone (FLONASE) 50 MCG/ACT nasal spray, Place 1 spray into both nostrils daily as needed for allergies., Disp: , Rfl:  .  HYDROcodone-acetaminophen (NORCO/VICODIN) 5-325 MG tablet, Take 1 tablet by mouth every 6 (six) hours as needed., Disp: , Rfl:  .  loratadine (CLARITIN) 10 MG tablet, Take 10 mg daily by mouth., Disp: , Rfl:  .  Multiple Vitamin (MULTIVITAMIN WITH MINERALS) TABS tablet, Take 1 tablet by mouth daily. One-A-Day, Disp: , Rfl:  .  naproxen (NAPROSYN) 500 MG tablet, Take 1 tablet (500 mg total) by mouth 2 (two) times daily as needed for mild pain., Disp: , Rfl:  .  ondansetron (ZOFRAN) 4 MG tablet, Take 1 tablet (4 mg total) by mouth daily as needed for nausea or vomiting., Disp: 10 tablet, Rfl: 1 .  polyethylene glycol (MIRALAX / GLYCOLAX) packet, Take 17 g by mouth daily as needed for mild constipation., Disp: 14 each, Rfl: 0 .  pseudoephedrine-acetaminophen (TYLENOL SINUS) 30-500 MG TABS tablet, Take 1-2 tablets by mouth every 4 (four) hours as needed (for congestion/allergies.)., Disp: , Rfl:  .  senna (SENOKOT) 8.6 MG TABS tablet, Take 1 tablet (8.6 mg total) by mouth 2 (two) times daily., Disp: 120 each, Rfl: 0 .  sertraline (ZOLOFT) 100 MG tablet, Take 1 tablet (100 mg total) by mouth at bedtime., Disp: 30 tablet, Rfl: 4 .  zolpidem (AMBIEN) 5 MG tablet, Take 1 tablet (5 mg total) by mouth at bedtime as needed for sleep., Disp: 30 tablet, Rfl: 0 .  predniSONE (STERAPRED UNI-PAK 21 TAB) 10 MG (21) TBPK tablet, Take as prescribed, Disp: 21 tablet, Rfl: 0 .  triamcinolone  ointment (KENALOG) 0.5 %, Apply 1 application topically 2 (two) times daily., Disp: 30 g, Rfl: 0 .  urea (CARMOL) 10 % cream, Apply topically as needed., Disp: 71 g, Rfl: 0  Physical exam:  Vitals:   12/22/17 1115  BP: 117/77  Pulse: 88  Resp: 16  TempSrc: Tympanic  Weight: 150 lb (68 kg)   Physical Exam  Constitutional: She is oriented to person, place, and time. Vital signs are normal. She appears well-developed  and well-nourished.  HENT:  Head: Normocephalic and atraumatic.  Eyes: Pupils are equal, round, and reactive to light.  Neck: Normal range of motion.  Cardiovascular: Normal rate, regular rhythm and normal heart sounds.  No murmur heard. Pulmonary/Chest: Effort normal and breath sounds normal. She has no wheezes.  Abdominal: Soft. Normal appearance and bowel sounds are normal. She exhibits no distension. There is no tenderness.  Musculoskeletal: Normal range of motion. She exhibits no edema.  Neurological: She is alert and oriented to person, place, and time.  Skin: Skin is warm and dry. Rash noted. Rash is maculopapular. There is erythema.  Chest, BUE, back and neck  Psychiatric: Judgment normal.  Nursing note and vitals reviewed.    CMP Latest Ref Rng & Units 12/01/2017  Glucose 70 - 99 mg/dL 125(H)  BUN 6 - 20 mg/dL 13  Creatinine 0.44 - 1.00 mg/dL 0.73  Sodium 135 - 145 mmol/L 134(L)  Potassium 3.5 - 5.1 mmol/L 3.8  Chloride 98 - 111 mmol/L 102  CO2 22 - 32 mmol/L 22  Calcium 8.9 - 10.3 mg/dL 9.5  Total Protein 6.5 - 8.1 g/dL 7.8  Total Bilirubin 0.3 - 1.2 mg/dL 0.6  Alkaline Phos 38 - 126 U/L 99  AST 15 - 41 U/L 24  ALT 0 - 44 U/L 13   CBC Latest Ref Rng & Units 12/01/2017  WBC 3.6 - 11.0 K/uL 5.3  Hemoglobin 12.0 - 16.0 g/dL 13.1  Hematocrit 35.0 - 47.0 % 37.9  Platelets 150 - 440 K/uL 231    No images are attached to the encounter.  No results found.   Assessment and plan- Patient is a 37 y.o. female who presents for pruritic rash x10 days.  1.   Triple negative breast cancer: Diagnosed in November 2018. s/p neoadjuvant chemotherapy.  4 cycles of Cytoxan and Adriamycin (11/18-01/19). S/p 12 cycles carbotaxol (1/19-4/19).  Had bilateral mastectomies by Dr. Marlou Starks on 09/15/17 with unfortunately residual disease (56m residual). Recently started Xeloda.  Status post 2 cycles. Scheduled to begin Cycle 3 today. Tolerated well.   2.  Allergic dermatitis: Uncertain of precipitating agent.  Will have her hold Xeloda (two days) until Wednesday where she can be reevaluated by Dr. BRogue Bussing  There is no dose reduction for dermatitis per oral chemotherapy pharmacist and unclear if this is what is the precipitating event.  Per up-to-date, Xeloda can cause dermatitis (27-37%).  She has also been around a cat and exposed to sunscreen/sun having just returned from the beach.  States normal reaction when exposed to a cat is upper respiratory issues not normally a rash.  States rash is very itchy and is not getting better, possible slightly worse.  Has tried a daily antihistamine (claritin x 7 days) and has used Benadryl at night for itching and to help her sleep.  This is only been helping moderately.  Will prescribe a 6-day course of steroids (taper). RX prednisone taper pack.  Spoke to [facility] oral chemotherapy pharmacist regarding interaction with systemic corticosteroids and Xeloda and steroids are acceptable.  Patient does not have any allergies.  Also will prescribe topical corticosteroid. RX Kenalog 0.5% cream to affected area.   3.  Finger/toe sensitivity/hand-foot syndrome: No evidence of blistering or skin breakdown on fingers or toes.  Will give her a prescription for urea 10% cream to use as needed and for prophylaxis of hand-foot syndrome.    Visit Diagnosis 1. Malignant neoplasm of upper-inner quadrant of right breast in female, estrogen receptor negative ([place]  2. Allergic dermatitis     Patient expressed understanding and was in agreement with this  plan. She also understands that She can call clinic at any time with any questions, concerns, or complaints.   Greater than 50% was spent in counseling and coordination of care with this patient including but not limited to discussion of the relevant topics above (See A&P) including, but not limited to diagnosis and management of acute and chronic medical conditions.    Faythe Casa, AGNP-C [facility]- [phone] Pager- [phone] 12/22/2017 1:43 PM

## 2017-12-22 NOTE — Telephone Encounter (Signed)
Patient called and reports that she has had hives for more than a week and wandering what to do about it. She is on Xeloda and is due to restart it today.  Appointment accepted for 11 AM today

## 2017-12-24 ENCOUNTER — Inpatient Hospital Stay (HOSPITAL_BASED_OUTPATIENT_CLINIC_OR_DEPARTMENT_OTHER): Admitting: Internal Medicine

## 2017-12-24 ENCOUNTER — Encounter: Payer: Self-pay | Admitting: Internal Medicine

## 2017-12-24 ENCOUNTER — Inpatient Hospital Stay

## 2017-12-24 ENCOUNTER — Other Ambulatory Visit: Payer: Self-pay

## 2017-12-24 VITALS — BP 106/68 | HR 81 | Temp 97.0°F | Resp 16 | Wt 150.0 lb

## 2017-12-24 DIAGNOSIS — Z171 Estrogen receptor negative status [ER-]: Principal | ICD-10-CM

## 2017-12-24 DIAGNOSIS — C50211 Malignant neoplasm of upper-inner quadrant of right female breast: Secondary | ICD-10-CM

## 2017-12-24 DIAGNOSIS — G4700 Insomnia, unspecified: Secondary | ICD-10-CM | POA: Diagnosis not present

## 2017-12-24 DIAGNOSIS — R21 Rash and other nonspecific skin eruption: Secondary | ICD-10-CM | POA: Diagnosis not present

## 2017-12-24 DIAGNOSIS — J3081 Allergic rhinitis due to animal (cat) (dog) hair and dander: Secondary | ICD-10-CM

## 2017-12-24 LAB — COMPREHENSIVE METABOLIC PANEL
ALK PHOS: 113 U/L (ref 38–126)
ALT: 14 U/L (ref 0–44)
ANION GAP: 10 (ref 5–15)
AST: 23 U/L (ref 15–41)
Albumin: 4.5 g/dL (ref 3.5–5.0)
BUN: 13 mg/dL (ref 6–20)
CALCIUM: 9.6 mg/dL (ref 8.9–10.3)
CO2: 27 mmol/L (ref 22–32)
Chloride: 103 mmol/L (ref 98–111)
Creatinine, Ser: 0.74 mg/dL (ref 0.44–1.00)
GFR calc Af Amer: >60 mL/min (ref >60)
GFR calc non Af Amer: >60 mL/min (ref >60)
Glucose, Bld: 82 mg/dL (ref 70–99)
POTASSIUM: 5 mmol/L (ref 3.5–5.1)
Sodium: 140 mmol/L (ref 135–145)
Total Bilirubin: 0.7 mg/dL (ref 0.3–1.2)
Total Protein: 7.7 g/dL (ref 6.5–8.1)

## 2017-12-24 LAB — CBC WITH DIFFERENTIAL/PLATELET
BASOS ABS: 0 10*3/uL (ref 0–0.1)
BASOS PCT: 1 %
EOS ABS: 0.2 10*3/uL (ref 0–0.7)
Eosinophils Relative: 5 %
HCT: 36.7 % (ref 35.0–47.0)
Hemoglobin: 12.5 g/dL (ref 12.0–16.0)
Lymphocytes Relative: 30 %
Lymphs Abs: 1.4 10*3/uL (ref 1.0–3.6)
MCH: 33.8 pg (ref 26.0–34.0)
MCHC: 34 g/dL (ref 32.0–36.0)
MCV: 99.4 fL (ref 80.0–100.0)
MONO ABS: 0.6 10*3/uL (ref 0.2–0.9)
Monocytes Relative: 12 %
Neutro Abs: 2.4 10*3/uL (ref 1.4–6.5)
Neutrophils Relative %: 52 %
PLATELETS: 210 10*3/uL (ref 150–440)
RBC: 3.69 MIL/uL — ABNORMAL LOW (ref 3.80–5.20)
RDW: 19.8 % — AB (ref 11.5–14.5)
WBC: 4.6 10*3/uL (ref 3.6–11.0)

## 2017-12-24 NOTE — Progress Notes (Signed)
[facility] OFFICE PROGRESS NOTE  Patient Care Team: Patient, No Pcp Per as PCP - General (General Practice) Magrinat, Virgie Dad, MD as Consulting Physician (Oncology) Marga Hoots, MD (Obstetrics and Gynecology) Dillingham, Loel Lofty, DO as Attending Physician (Plastic Surgery) Coralie Keens, MD as Consulting Physician (General Surgery) Cammie Sickle, MD as Consulting Physician (Internal Medicine) Rico Junker, RN as Oncology Nurse Navigator Jovita Kussmaul, MD as Consulting Physician (General Surgery)  Cancer Staging No matching staging information was found for the patient.   Oncology History   # OCT 2018- RIGHT BREAST UIQ -[place]; TRIPLE NEGATIVE; ki-67-high [51m-1'O clock-Bx- proven; [place]]; another- 12'O clock-480m Not biopsied. [s/p Dr.Magrinaut; [place]]; cT1c(m) cN0;  G-3; ki-67- 80%  # 11/218- ddACesolved;  x4 cyles- US/ammo- improved 51m34mothe leison-r-Taxol+carbo [finished ealry April 2019]  # MAY 2019- s/p Mastec& SLNBx [Dr.Toth; [place]]; 6mm3msidual ca; 3 SLN-NEG [ypT1bsnN0]  # BRCA-1 Gene mutated [mom-ovarian cancer-Stage IIIB/Brca; older sister- breast ca/brca-mutated]  # MUGA scan [nov [id number]]%; UPBEAT clinical trial; port explanted.  --------------------------------------------------------   DIAGNOSIS: TRIPLE NEGATIVE BREAST CA  STAGE:  I   ; GOALS: CURATIVE  CURRENT/MOST RECENT THERAPY; Xeloda       Malignant neoplasm of upper-inner quadrant of right breast in female, estrogen receptor negative (HCC)[place]   INTERVAL HISTORY:  Holly [id number].  female pleasant patient above history of stage I triple negative breast cancer currently on adjuvant Xeloda is here for follow-up.  Patient was seen in the clinic 2 days ago for diffuse skin rash/itch.  Patient rash started approximately 10 days ago. [Patient had just finished her cycle #2 of Xeloda]  Patient states that she noted to have rash/erythematous mostly macular  upper torso upper extremities and neck.  No rash on the abdomen or on the lower extremities.  Patient states she was recently in the beach/taken all precautions including sunblock to avoid photosensitivity.  Patient was treated with steroid ointment/Medrol Dosepak.  Patient is noted significant improvement of her skin rash itching/about 60% better.  Of note patient is allergic to cats-causing sneezing/nasal stuffiness.  Recently in contact with the cat.  Review of Systems  Constitutional: Negative for chills, diaphoresis, fever, malaise/fatigue and weight loss.  HENT: Negative for nosebleeds and sore throat.   Eyes: Negative for double vision.  Respiratory: Negative for cough, hemoptysis, sputum production, shortness of breath and wheezing.   Cardiovascular: Negative for chest pain, palpitations, orthopnea and leg swelling.  Gastrointestinal: Negative for abdominal pain, blood in stool, constipation, diarrhea, heartburn, melena, nausea and vomiting.  Genitourinary: Negative for dysuria, frequency and urgency.  Musculoskeletal: Negative for back pain and joint pain.  Skin: Positive for itching and rash.  Neurological: Negative for dizziness, tingling, focal weakness, weakness and headaches.  Endo/Heme/Allergies: Does not bruise/bleed easily.  Psychiatric/Behavioral: Negative for depression. The patient is not nervous/anxious and does not have insomnia.       PAST MEDICAL HISTORY :  Past Medical History:  Diagnosis Date  . Anemia    iron deficiency during chemotherapy  . Anxiety   . Asthma    Excercise induced as a child  . Fibromyalgia   . Headache    migraine headaches due to sinus/allergies and teeth grinding at night  . Malignant neoplasm of upper-inner quadrant of right breast in female, estrogen receptor negative (HCC)[id number]   right breast  . Personal history of chemotherapy    Right breast- 4 treatments    PAST SURGICAL HISTORY :  Past Surgical History:  Procedure  Laterality Date  . BREAST RECONSTRUCTION WITH PLACEMENT OF TISSUE EXPANDER AND FLEX HD (ACELLULAR HYDRATED DERMIS) Bilateral 09/15/2017   Procedure: BREAST RECONSTRUCTION WITH PLACEMENT OF TISSUE EXPANDER AND FLEX HD (ACELLULAR HYDRATED DERMIS);  Surgeon: Wallace Going, DO;  Location: [facility] OR[facility];  Service: Plastics;  Laterality: Bilateral;  . IR IMAGING GUIDED PORT INSERTION  03/17/2017  . KNEE SURGERY Left 2009   Torn meniscus   . NIPPLE SPARING MASTECTOMY/SENTINAL LYMPH NODE BIOPSY/RECONSTRUCTION/PLACEMENT OF TISSUE EXPANDER Bilateral 09/15/2017   Procedure: BILATERAL NIPPLE SPARING MASTECTOMIES  WITH RIGHT  SENTINAL LYMPH NODE BIOPSY;  Surgeon: Jovita Kussmaul, MD;  Location: [facility];  Service: General;  Laterality: Bilateral;  . PORT-A-CATH REMOVAL N/A 09/15/2017   Procedure: REMOVAL PORT-A-CATH;  Surgeon: Jovita Kussmaul, MD;  Location: [facility] OR[facility];  Service: General;  Laterality: N/A;  . RHINOPLASTY  [id number]   both surgeries due to broken nose    FAMILY HISTORY :   Family History  Problem Relation Age of Onset  . Cancer Mother        Ovarian cancer (2003), breast cancer(2017) & squamous cell   . Leukemia Mother   . Breast cancer Mother 32  . Cancer Sister        breast cancer  . Breast cancer Sister 94  . Melanoma Father   . Lung cancer Paternal Grandmother     SOCIAL HISTORY:   Social History   Tobacco Use  . Smoking status: Former Smoker    Packs/day: 0.25    Types: Cigarettes    Last attempt to quit: 09/16/2011    Years since quitting: 6.2  . Smokeless tobacco: Never Used  Substance Use Topics  . Alcohol use: No  . Drug use: No    ALLERGIES:  is allergic to seasonal ic [cholestatin].  MEDICATIONS:  Current Outpatient Medications  Medication Sig Dispense Refill  . acetaminophen (TYLENOL) 325 MG tablet Take 1 tablet (325 mg total) by mouth every 6 (six) hours.    . capecitabine (XELODA) 150 MG tablet Take 2 tablets (300 mg total) by mouth 2 (two) times daily  after a meal. Take for 14 days on, then 7 days off. Take with three 541m tablets 56 tablet 5  . capecitabine (XELODA) 500 MG tablet Take 3 tablets (1,500 mg total) by mouth 2 (two) times daily after a meal. Take for 14 days on, then 7 days off. Take with two 1564mtablets 84 tablet 5  . cromolyn (NASALCROM) 5.2 MG/ACT nasal spray Place 1 spray into both nostrils 4 (four) times daily as needed for allergies or rhinitis. 26 mL 0  . diazepam (VALIUM) 2 MG tablet Take 1 tablet (2 mg total) by mouth every 8 (eight) hours as needed for muscle spasms. 30 tablet 0  . diphenhydrAMINE (BENADRYL) 12.5 MG/5ML elixir Take 5 mLs (12.5 mg total) by mouth every 6 (six) hours as needed for itching. 120 mL 0  . fluticasone (FLONASE) 50 MCG/ACT nasal spray Place 1 spray into both nostrils daily as needed for allergies.    . Marland KitchenYDROcodone-acetaminophen (NORCO/VICODIN) 5-325 MG tablet Take 1 tablet by mouth every 6 (six) hours as needed.    . loratadine (CLARITIN) 10 MG tablet Take 10 mg daily by mouth.    . Multiple Vitamin (MULTIVITAMIN WITH MINERALS) TABS tablet Take 1 tablet by mouth daily. One-A-Day    . naproxen (NAPROSYN) 500 MG tablet Take 1 tablet (500 mg total) by mouth 2 (two) times daily as needed  for mild pain.    Marland Kitchen ondansetron (ZOFRAN) 4 MG tablet Take 1 tablet (4 mg total) by mouth daily as needed for nausea or vomiting. 10 tablet 1  . polyethylene glycol (MIRALAX / GLYCOLAX) packet Take 17 g by mouth daily as needed for mild constipation. 14 each 0  . predniSONE (STERAPRED UNI-PAK 21 TAB) 10 MG (21) TBPK tablet Take as prescribed 21 tablet 0  . pseudoephedrine-acetaminophen (TYLENOL SINUS) 30-500 MG TABS tablet Take 1-2 tablets by mouth every 4 (four) hours as needed (for congestion/allergies.).    Marland Kitchen senna (SENOKOT) 8.6 MG TABS tablet Take 1 tablet (8.6 mg total) by mouth 2 (two) times daily. 120 each 0  . sertraline (ZOLOFT) 100 MG tablet Take 1 tablet (100 mg total) by mouth at bedtime. 30 tablet 4  .  triamcinolone ointment (KENALOG) 0.5 % Apply 1 application topically 2 (two) times daily. 30 g 0  . urea (CARMOL) 10 % cream Apply topically as needed. 71 g 0  . zolpidem (AMBIEN) 5 MG tablet Take 1 tablet (5 mg total) by mouth at bedtime as needed for sleep. 30 tablet 0   No current facility-administered medications for this visit.     PHYSICAL EXAMINATION: ECOG PERFORMANCE STATUS: 0 - Asymptomatic  BP 106/68 (BP Location: Left Arm, Patient Position: Sitting)   Pulse 81   Temp (!) 97 F (36.1 C) (Tympanic)   Resp 16   Wt 150 lb (68 kg)   BMI 23.49 kg/m   Filed Weights   12/24/17 1116  Weight: 150 lb (68 kg)    Physical Exam  Constitutional: She is oriented to person, place, and time and well-developed, well-nourished, and in no distress.  HENT:  Head: Normocephalic and atraumatic.  Mouth/Throat: Oropharynx is clear and moist. No oropharyngeal exudate.  Eyes: Pupils are equal, round, and reactive to light.  Neck: Normal range of motion. Neck supple.  Cardiovascular: Normal rate and regular rhythm.  Pulmonary/Chest: No respiratory distress. She has no wheezes.  Abdominal: Soft. Bowel sounds are normal. She exhibits no distension and no mass. There is no tenderness. There is no rebound and no guarding.  Musculoskeletal: Normal range of motion. She exhibits no edema or tenderness.  Neurological: She is alert and oriented to person, place, and time.  Skin: Skin is warm.  Diffuse erythematous/macular rash upper torso extensor surfaces of the upper extremities.  Psychiatric: Affect normal.      LABORATORY DATA:  I have reviewed the data as listed    Component Value Date/Time   NA 140 12/24/2017 1051   K 5.0 12/24/2017 1051   CL 103 12/24/2017 1051   CO2 27 12/24/2017 1051   GLUCOSE 82 12/24/2017 1051   BUN 13 12/24/2017 1051   CREATININE 0.74 12/24/2017 1051   CALCIUM 9.6 12/24/2017 1051   PROT 7.7 12/24/2017 1051   ALBUMIN 4.5 12/24/2017 1051   AST 23 12/24/2017  1051   ALT 14 12/24/2017 1051   ALKPHOS 113 12/24/2017 1051   BILITOT 0.7 12/24/2017 1051   GFRNONAA >60 12/24/2017 1051   GFRAA >60 12/24/2017 1051    No results found for: SPEP, UPEP  Lab Results  Component Value Date   WBC 4.6 12/24/2017   NEUTROABS 2.4 12/24/2017   HGB 12.5 12/24/2017   HCT 36.7 12/24/2017   MCV 99.4 12/24/2017   PLT 210 12/24/2017      Chemistry      Component Value Date/Time   NA 140 12/24/2017 1051   K 5.0 12/24/2017  1051   CL 103 12/24/2017 1051   CO2 27 12/24/2017 1051   BUN 13 12/24/2017 1051   CREATININE 0.74 12/24/2017 1051      Component Value Date/Time   CALCIUM 9.6 12/24/2017 1051   ALKPHOS 113 12/24/2017 1051   AST 23 12/24/2017 1051   ALT 14 12/24/2017 1051   BILITOT 0.7 12/24/2017 1051       RADIOGRAPHIC STUDIES: I have personally reviewed the radiological images as listed and agreed with the findings in the report. No results found.   ASSESSMENT & PLAN:  Malignant neoplasm of upper-inner quadrant of right breast in female, estrogen receptor negative ([place]) #  Stage I triple negative breast cancer; post neoadjuvant chemotherapy- 6 mm residual disease.  Currently on adjuvant Xeloda.  Clinically stable.  #Hold starting cycle #3 this week [ongoing rash see discussion]; recommend starting 9/2; Labs today reviewed;  acceptable for treatment.    # skin rash distribution of the rash suggestive of sun sensitivity; recommend finishing out the Medrol Dosepak; moisturizing lotion/Vaseline.  Avoiding hot showers.    # Insomnia-recommend Ambien; scription given.  Stable.  # follow up on 23rd/labs/MD. Start xeloda on 9/ 2nd.     Orders Placed This Encounter  Procedures  . CBC with Differential/Platelet    Standing Status:   Future    Standing Expiration Date:   12/25/2018  . Comprehensive metabolic panel    Standing Status:   Future    Standing Expiration Date:   12/25/2018   All questions were answered. The patient knows to call  the clinic with any problems, questions or concerns.      Cammie Sickle, MD 12/24/2017 1:22 PM

## 2017-12-24 NOTE — Assessment & Plan Note (Addendum)
#    Stage I triple negative breast cancer; post neoadjuvant chemotherapy- 6 mm residual disease.  Currently on adjuvant Xeloda.  Clinically stable.  #Hold starting cycle #3 this week [ongoing rash see discussion]; recommend starting 9/2; Labs today reviewed;  acceptable for treatment.    # skin rash distribution of the rash suggestive of sun sensitivity; recommend finishing out the Medrol Dosepak; moisturizing lotion/Vaseline.  Avoiding hot showers.    # Insomnia-recommend Ambien; scription given.  Stable.  # follow up on 23rd/labs/MD. Start xeloda on 9/ 2nd.

## 2017-12-26 ENCOUNTER — Encounter: Payer: Self-pay | Admitting: Oncology

## 2018-01-07 ENCOUNTER — Other Ambulatory Visit: Payer: Self-pay | Admitting: *Deleted

## 2018-01-07 ENCOUNTER — Encounter

## 2018-01-07 MED ORDER — ZOLPIDEM TARTRATE 5 MG PO TABS: 5.0000 mg | ORAL_TABLET | Freq: Every evening | ORAL | 0 refills | Status: DC | PRN

## 2018-01-07 MED FILL — CAPECITABINE 500 MG TABS: 500 | 14 days supply | Qty: 84 | Fill #3

## 2018-01-07 MED FILL — CAPECITABINE 150 MG TABS: 150 | 14 days supply | Qty: 56 | Fill #3

## 2018-01-09 ENCOUNTER — Telehealth: Payer: Self-pay | Admitting: *Deleted

## 2018-01-09 NOTE — Telephone Encounter (Signed)
Patient called for Ambien refill a review of the medication indicates that it was filled on 01/07/2018. Advised patient to call her pharmacy to check on prescription. Call back if no prescription.

## 2018-01-14 ENCOUNTER — Encounter: Payer: Self-pay | Admitting: Internal Medicine

## 2018-01-14 ENCOUNTER — Inpatient Hospital Stay: Attending: Internal Medicine

## 2018-01-14 ENCOUNTER — Encounter: Payer: Self-pay | Admitting: *Deleted

## 2018-01-14 ENCOUNTER — Other Ambulatory Visit: Payer: Self-pay

## 2018-01-14 ENCOUNTER — Inpatient Hospital Stay (HOSPITAL_BASED_OUTPATIENT_CLINIC_OR_DEPARTMENT_OTHER): Admitting: Internal Medicine

## 2018-01-14 VITALS — BP 105/70 | HR 76 | Temp 97.3°F | Resp 16 | Wt 151.0 lb

## 2018-01-14 DIAGNOSIS — L271 Localized skin eruption due to drugs and medicaments taken internally: Secondary | ICD-10-CM

## 2018-01-14 DIAGNOSIS — G4700 Insomnia, unspecified: Secondary | ICD-10-CM

## 2018-01-14 DIAGNOSIS — Z87891 Personal history of nicotine dependence: Secondary | ICD-10-CM | POA: Diagnosis not present

## 2018-01-14 DIAGNOSIS — Z171 Estrogen receptor negative status [ER-]: Principal | ICD-10-CM

## 2018-01-14 DIAGNOSIS — C50211 Malignant neoplasm of upper-inner quadrant of right female breast: Secondary | ICD-10-CM

## 2018-01-14 DIAGNOSIS — R21 Rash and other nonspecific skin eruption: Secondary | ICD-10-CM

## 2018-01-14 DIAGNOSIS — N941 Unspecified dyspareunia: Secondary | ICD-10-CM

## 2018-01-14 LAB — CBC WITH DIFFERENTIAL/PLATELET
Basophils Absolute: 0 10*3/uL (ref 0–0.1)
Basophils Relative: 1 %
Eosinophils Absolute: 0.4 10*3/uL (ref 0–0.7)
Eosinophils Relative: 8 %
HCT: 35.5 % (ref 35.0–47.0)
HEMOGLOBIN: 12.5 g/dL (ref 12.0–16.0)
LYMPHS ABS: 1.2 10*3/uL (ref 1.0–3.6)
Lymphocytes Relative: 22 %
MCH: 35.7 pg — AB (ref 26.0–34.0)
MCHC: 35.2 g/dL (ref 32.0–36.0)
MCV: 101.3 fL — ABNORMAL HIGH (ref 80.0–100.0)
MONOS PCT: 11 %
Monocytes Absolute: 0.6 10*3/uL (ref 0.2–0.9)
NEUTROS ABS: 3.1 10*3/uL (ref 1.4–6.5)
NEUTROS PCT: 58 %
Platelets: 175 10*3/uL (ref 150–440)
RBC: 3.5 MIL/uL — AB (ref 3.80–5.20)
RDW: 20.7 % — ABNORMAL HIGH (ref 11.5–14.5)
WBC: 5.4 10*3/uL (ref 3.6–11.0)

## 2018-01-14 LAB — COMPREHENSIVE METABOLIC PANEL
ALT: 16 U/L (ref 0–44)
ANION GAP: 7 (ref 5–15)
AST: 24 U/L (ref 15–41)
Albumin: 4.5 g/dL (ref 3.5–5.0)
Alkaline Phosphatase: 105 U/L (ref 38–126)
BUN: 18 mg/dL (ref 6–20)
CHLORIDE: 101 mmol/L (ref 98–111)
CO2: 30 mmol/L (ref 22–32)
Calcium: 9.8 mg/dL (ref 8.9–10.3)
Creatinine, Ser: 0.67 mg/dL (ref 0.44–1.00)
Glucose, Bld: 92 mg/dL (ref 70–99)
Potassium: 5.1 mmol/L (ref 3.5–5.1)
SODIUM: 138 mmol/L (ref 135–145)
Total Bilirubin: 0.5 mg/dL (ref 0.3–1.2)
Total Protein: 7.9 g/dL (ref 6.5–8.1)

## 2018-01-14 NOTE — Assessment & Plan Note (Addendum)
#   Stage I triple negative breast cancer; post neoadjuvant chemotherapy- 6 mm residual disease.  Currently on adjuvant Xeloda.  STABLE.  s/p cycle #3;    # recommend starting #4 9/23; Labs today reviewed;  acceptable for treatment today.  Will evaluate for the immunotherapy versus placebo clinical trial.  Discussed with clinical trials nurse.  # hand foot syndrome- G-1;sec to xeloda-  on urea/ vaseline.   # Painful intercourse- recommend K-Jelly; pt anxious; will refer to Counsellor/pelvic PT.   # skin rash distribution of the rash suggestive of sun sensitivity- improved.   # Insomnia-continue Ambien; STABLE.   # follow up on 14th oct, 2019; cbc/cmp.

## 2018-01-14 NOTE — Progress Notes (Signed)
Spoke with patient Holly Parker this morning during her routine clinic visit with Dr. Rogue Bussing. She has just completed Cycle 3 of her adjuvant Xeloda. States she has had some issues with a rash and with soreness and peeling of the palms of her hands and the bottoms of her feet. Met with patient to discuss her upcoming 12 month visit on the UPBEAT study - which is due 03/17/18 with a +/- 60 day window. Patient also states that her mother's oncologist had inquired about whether she would be eligible for a study we have open using Keytruda. Informed that this is the SWOG [id number] study, and that she is not eligible because her residual disease was too small. The study requires at least 1cm of residual breast cancer or a positive lymph node and Ms. Savino was node negative and had 31m of residual tumor in her definitive surgical specimen. Dr. BRogue Bussingwas also informed of this. Will meet with the patient next month to determine when we will schedule her study visit, but she expressed a desire to complete this during a scheduled visit rather than having an extra visit scheduled. KYolande Jolly BSN, MHA, OCN 01/14/2018 10:15 AM

## 2018-01-21 ENCOUNTER — Telehealth: Payer: Self-pay | Admitting: *Deleted

## 2018-01-21 DIAGNOSIS — N941 Unspecified dyspareunia: Secondary | ICD-10-CM

## 2018-01-21 NOTE — Progress Notes (Signed)
[place] OFFICE PROGRESS NOTE  Patient Care Team: Patient, No Pcp Per as PCP - General (General Practice) Magrinat, Virgie Dad, MD as Consulting Physician (Oncology) Marga Hoots, MD (Obstetrics and Gynecology) Dillingham, Loel Lofty, DO as Attending Physician (Plastic Surgery) Coralie Keens, MD as Consulting Physician (General Surgery) Cammie Sickle, MD as Consulting Physician (Internal Medicine) Rico Junker, RN as Oncology Nurse Navigator Jovita Kussmaul, MD as Consulting Physician (General Surgery)  Cancer Staging No matching staging information was found for the patient.   Oncology History   # OCT 2018- RIGHT BREAST UIQ -[place]; TRIPLE NEGATIVE; ki-67-high [69m-1'O clock-Bx- proven; [place]]; another- 12'O clock-445m Not biopsied. [s/p Dr.Magrinaut; [place]]; cT1c(m) cN0;  G-3; ki-67- 80%  # 11/218- ddACesolved;  x4 cyles- US/ammo- improved 39m71mothe leison-r-Taxol+carbo [finished ealry April 2019]  # MAY 2019- s/p Mastec& SLNBx [Dr.Toth; [place]]; 6mm35msidual ca; 3 SLN-NEG [ypT1bsnN0]  # BRCA-1 Gene mutated [mom-ovarian cancer-Stage IIIB/Brca; older sister- breast ca/brca-mutated]  # MUGA scan [nov [id number]]%; UPBEAT clinical trial; port explanted.  --------------------------------------------------------   DIAGNOSIS: TRIPLE NEGATIVE BREAST CA  STAGE:  I   ; GOALS: CURATIVE  CURRENT/MOST RECENT THERAPY; Xeloda       Malignant neoplasm of upper-inner quadrant of right breast in female, estrogen receptor negative (HCC)[place]   INTERVAL HISTORY:  Holly COVELLy24.  female pleasant patient above history of stage I triple negative breast cancer currently on adjuvant Xeloda is here for follow-up.  No nausea no vomiting.  Appetite is good.  Rash in the palms and soles improved.   She is concerned about painful colitis.  Review of Systems  Constitutional: Negative for chills, diaphoresis, fever, malaise/fatigue and weight loss.  HENT:  Negative for nosebleeds and sore throat.   Eyes: Negative for double vision.  Respiratory: Negative for cough, hemoptysis, sputum production, shortness of breath and wheezing.   Cardiovascular: Negative for chest pain, palpitations, orthopnea and leg swelling.  Gastrointestinal: Negative for abdominal pain, blood in stool, constipation, diarrhea, heartburn, melena, nausea and vomiting.  Genitourinary: Negative for dysuria, frequency and urgency.  Musculoskeletal: Negative for back pain and joint pain.  Neurological: Negative for dizziness, tingling, focal weakness, weakness and headaches.  Endo/Heme/Allergies: Does not bruise/bleed easily.  Psychiatric/Behavioral: Negative for depression. The patient is not nervous/anxious and does not have insomnia.       PAST MEDICAL HISTORY :  Past Medical History:  Diagnosis Date  . Anemia    iron deficiency during chemotherapy  . Anxiety   . Asthma    Excercise induced as a child  . Fibromyalgia   . Headache    migraine headaches due to sinus/allergies and teeth grinding at night  . Malignant neoplasm of upper-inner quadrant of right breast in female, estrogen receptor negative (HCC)[place]18   right breast  . Personal history of chemotherapy    Right breast- 4 treatments    PAST SURGICAL HISTORY :   Past Surgical History:  Procedure Laterality Date  . BREAST RECONSTRUCTION WITH PLACEMENT OF TISSUE EXPANDER AND FLEX HD (ACELLULAR HYDRATED DERMIS) Bilateral 09/15/2017   Procedure: BREAST RECONSTRUCTION WITH PLACEMENT OF TISSUE EXPANDER AND FLEX HD (ACELLULAR HYDRATED DERMIS);  Surgeon: DillWallace Going;  Location: [facility] ORS;  Service: Plastics;  Laterality: Bilateral;  . IR IMAGING GUIDED PORT INSERTION  03/17/2017  . KNEE SURGERY Left 2009   Torn meniscus   . NIPPLE SPARING MASTECTOMY/SENTINAL LYMPH NODE BIOPSY/RECONSTRUCTION/PLACEMENT OF TISSUE EXPANDER Bilateral 09/15/2017   Procedure:  BILATERAL NIPPLE SPARING MASTECTOMIES  WITH RIGHT   SENTINAL LYMPH NODE BIOPSY;  Surgeon: Jovita Kussmaul, MD;  Location: [facility] ORS;  Service: General;  Laterality: Bilateral;  . PORT-A-CATH REMOVAL N/A 09/15/2017   Procedure: REMOVAL PORT-A-CATH;  Surgeon: Jovita Kussmaul, MD;  Location: [facility] OR[facility];  Service: General;  Laterality: N/A;  . RHINOPLASTY  [id number]   both surgeries due to broken nose    FAMILY HISTORY :   Family History  Problem Relation Age of Onset  . Cancer Mother        Ovarian cancer (2003), breast cancer(2017) & squamous cell   . Leukemia Mother   . Breast cancer Mother 1  . Cancer Sister        breast cancer  . Breast cancer Sister 34  . Melanoma Father   . Lung cancer Paternal Grandmother     SOCIAL HISTORY:   Social History   Tobacco Use  . Smoking status: Former Smoker    Packs/day: 0.25    Types: Cigarettes    Last attempt to quit: 09/16/2011    Years since quitting: 6.3  . Smokeless tobacco: Never Used  Substance Use Topics  . Alcohol use: No  . Drug use: No    ALLERGIES:  is allergic to seasonal ic [cholestatin].  MEDICATIONS:  Current Outpatient Medications  Medication Sig Dispense Refill  . acetaminophen (TYLENOL) 325 MG tablet Take 1 tablet (325 mg total) by mouth every 6 (six) hours.    . capecitabine (XELODA) 150 MG tablet Take 2 tablets (300 mg total) by mouth 2 (two) times daily after a meal. Take for 14 days on, then 7 days off. Take with three 571m tablets 56 tablet 5  . capecitabine (XELODA) 500 MG tablet Take 3 tablets (1,500 mg total) by mouth 2 (two) times daily after a meal. Take for 14 days on, then 7 days off. Take with two 1537mtablets 84 tablet 5  . cromolyn (NASALCROM) 5.2 MG/ACT nasal spray Place 1 spray into both nostrils 4 (four) times daily as needed for allergies or rhinitis. 26 mL 0  . diazepam (VALIUM) 2 MG tablet Take 1 tablet (2 mg total) by mouth every 8 (eight) hours as needed for muscle spasms. 30 tablet 0  . diphenhydrAMINE (BENADRYL) 12.5 MG/5ML elixir Take 5 mLs  (12.5 mg total) by mouth every 6 (six) hours as needed for itching. 120 mL 0  . fluticasone (FLONASE) 50 MCG/ACT nasal spray Place 1 spray into both nostrils daily as needed for allergies.    . Marland KitchenYDROcodone-acetaminophen (NORCO/VICODIN) 5-325 MG tablet Take 1 tablet by mouth every 6 (six) hours as needed.    . loratadine (CLARITIN) 10 MG tablet Take 10 mg daily by mouth.    . Multiple Vitamin (MULTIVITAMIN WITH MINERALS) TABS tablet Take 1 tablet by mouth daily. One-A-Day    . naproxen (NAPROSYN) 500 MG tablet Take 1 tablet (500 mg total) by mouth 2 (two) times daily as needed for mild pain.    . Marland Kitchenndansetron (ZOFRAN) 4 MG tablet Take 1 tablet (4 mg total) by mouth daily as needed for nausea or vomiting. 10 tablet 1  . polyethylene glycol (MIRALAX / GLYCOLAX) packet Take 17 g by mouth daily as needed for mild constipation. 14 each 0  . predniSONE (STERAPRED UNI-PAK 21 TAB) 10 MG (21) TBPK tablet Take as prescribed 21 tablet 0  . pseudoephedrine-acetaminophen (TYLENOL SINUS) 30-500 MG TABS tablet Take 1-2 tablets by mouth every 4 (four) hours as needed (for congestion/allergies.).    .Marland Kitchen  senna (SENOKOT) 8.6 MG TABS tablet Take 1 tablet (8.6 mg total) by mouth 2 (two) times daily. 120 each 0  . sertraline (ZOLOFT) 100 MG tablet Take 1 tablet (100 mg total) by mouth at bedtime. 30 tablet 4  . triamcinolone ointment (KENALOG) 0.5 % Apply 1 application topically 2 (two) times daily. 30 g 0  . urea (CARMOL) 10 % cream Apply topically as needed. 71 g 0  . zolpidem (AMBIEN) 5 MG tablet Take 1 tablet (5 mg total) by mouth at bedtime as needed for sleep. 30 tablet 0   No current facility-administered medications for this visit.     PHYSICAL EXAMINATION: ECOG PERFORMANCE STATUS: 0 - Asymptomatic  BP 105/70 (BP Location: Left Arm, Patient Position: Sitting)   Pulse 76   Temp (!) 97.3 F (36.3 C) (Tympanic)   Resp 16   Wt 151 lb (68.5 kg)   BMI 23.65 kg/m   Filed Weights   01/14/18 0918  Weight: 151  lb (68.5 kg)    Physical Exam  Constitutional: She is oriented to person, place, and time and well-developed, well-nourished, and in no distress.  HENT:  Head: Normocephalic and atraumatic.  Mouth/Throat: Oropharynx is clear and moist. No oropharyngeal exudate.  Eyes: Pupils are equal, round, and reactive to light.  Neck: Normal range of motion. Neck supple.  Cardiovascular: Normal rate and regular rhythm.  Pulmonary/Chest: No respiratory distress. She has no wheezes.  Abdominal: Soft. Bowel sounds are normal. She exhibits no distension and no mass. There is no tenderness. There is no rebound and no guarding.  Musculoskeletal: Normal range of motion. She exhibits no edema or tenderness.  Neurological: She is alert and oriented to person, place, and time.  Skin: Skin is warm.  Psychiatric: Affect normal.      LABORATORY DATA:  I have reviewed the data as listed    Component Value Date/Time   NA 138 01/14/2018 0857   K 5.1 01/14/2018 0857   CL 101 01/14/2018 0857   CO2 30 01/14/2018 0857   GLUCOSE 92 01/14/2018 0857   BUN 18 01/14/2018 0857   CREATININE 0.67 01/14/2018 0857   CALCIUM 9.8 01/14/2018 0857   PROT 7.9 01/14/2018 0857   ALBUMIN 4.5 01/14/2018 0857   AST 24 01/14/2018 0857   ALT 16 01/14/2018 0857   ALKPHOS 105 01/14/2018 0857   BILITOT 0.5 01/14/2018 0857   GFRNONAA >60 01/14/2018 0857   GFRAA >60 01/14/2018 0857    No results found for: SPEP, UPEP  Lab Results  Component Value Date   WBC 5.4 01/14/2018   NEUTROABS 3.1 01/14/2018   HGB 12.5 01/14/2018   HCT 35.5 01/14/2018   MCV 101.3 (H) 01/14/2018   PLT 175 01/14/2018      Chemistry      Component Value Date/Time   NA 138 01/14/2018 0857   K 5.1 01/14/2018 0857   CL 101 01/14/2018 0857   CO2 30 01/14/2018 0857   BUN 18 01/14/2018 0857   CREATININE 0.67 01/14/2018 0857      Component Value Date/Time   CALCIUM 9.8 01/14/2018 0857   ALKPHOS 105 01/14/2018 0857   AST 24 01/14/2018 0857    ALT 16 01/14/2018 0857   BILITOT 0.5 01/14/2018 0857       RADIOGRAPHIC STUDIES: I have personally reviewed the radiological images as listed and agreed with the findings in the report. No results found.   ASSESSMENT & PLAN:  Malignant neoplasm of upper-inner quadrant of right breast in female,  estrogen receptor negative ([place]) # Stage I triple negative breast cancer; post neoadjuvant chemotherapy- 6 mm residual disease.  Currently on adjuvant Xeloda.  STABLE.  s/p cycle #3;    # recommend starting #4 9/23; Labs today reviewed;  acceptable for treatment today.  Will evaluate for the immunotherapy versus placebo clinical trial.  Discussed with clinical trials nurse.  # hand foot syndrome- G-1;sec to xeloda-  on urea/ vaseline.   # Painful intercourse- recommend K-Jelly; pt anxious; will refer to Counsellor/pelvic PT.   # skin rash distribution of the rash suggestive of sun sensitivity- improved.   # Insomnia-continue Ambien; STABLE.   # follow up on 14th oct, 2019; cbc/cmp.     No orders of the defined types were placed in this encounter.  All questions were answered. The patient knows to call the clinic with any problems, questions or concerns.      Cammie Sickle, MD 01/21/2018 6:08 PM

## 2018-01-21 NOTE — Telephone Encounter (Signed)
Per v/o Dr. Rogue Bussing, contacted patient regarding concerns for dyspareunia and toll on marriage that this is causing. Patient provided with the phone # to reach Nathanial Millman, c.ctr counselor to discuss emotional concerns regarding her marriage. Discussed pelvic PT referral with patient. She is highly interested. MD gave v/o to proceed with this referral. Discussed that [place] or her colleague would reach out to her to schedule this pelvic pt.

## 2018-01-22 ENCOUNTER — Other Ambulatory Visit: Payer: Self-pay | Admitting: Nurse Practitioner

## 2018-01-22 ENCOUNTER — Telehealth: Payer: Self-pay | Admitting: *Deleted

## 2018-01-22 MED ORDER — AZITHROMYCIN 250 MG PO TABS: ORAL_TABLET | ORAL | 0 refills | Status: DC

## 2018-01-22 NOTE — Telephone Encounter (Signed)
Fax rcvd at 1600 from [facility] medical records regarding Holly Parker's call.  Incoming fax rcvd in from answering service at 1515.   Returned patient's phone call at 1610. Patient c/o 'worsening signs of sinus infection. Yellow/green drainage and my ear aches a little too." pt requesting antibiotic. Exposed to upper resp. Infection/sinus infections with her 2 daughters at home. She denies any fever.  Spoke with Ander Purpura, NP, who will send a zpac. Patient instructed if no better to contact our office back to be seen in [facility] with NP.

## 2018-01-22 NOTE — Progress Notes (Signed)
Patient reporting symptoms of sinus infection. Will start azithromycin. If no improvement, would like patient to return to clinic for evaluation in [facility].

## 2018-01-27 ENCOUNTER — Encounter: Payer: Self-pay | Admitting: Plastic Surgery

## 2018-01-27 ENCOUNTER — Ambulatory Visit (INDEPENDENT_AMBULATORY_CARE_PROVIDER_SITE_OTHER): Admitting: Plastic Surgery

## 2018-01-27 VITALS — HR 78 | Resp 20 | Ht 67.0 in | Wt 147.0 lb

## 2018-01-27 DIAGNOSIS — Z9013 Acquired absence of bilateral breasts and nipples: Secondary | ICD-10-CM

## 2018-01-27 DIAGNOSIS — Z853 Personal history of malignant neoplasm of breast: Secondary | ICD-10-CM | POA: Diagnosis not present

## 2018-01-27 NOTE — Progress Notes (Addendum)
   Subjective:    Patient ID: Holly Parker, female    DOB: 18-Oct-1980, 37 y.o.   MRN: [id number]  The patient is a 37 yrs old wf here with her mom for follow up on her bilateral NSM breast reconstruction from May 2019.  She is doing well and is now on one chemo pill listed in meds.  She is ready to have the expanders removed and implants placed May 2019.  She understands the limitation with her size.  She has right 430 / 350 cc and left 430 / 350 cc at this time.  No rash or skin issues.  The right expander is a little higher than the left.  She was BRCA-1 positive, triple negative invasive right breast cancer.  She is 5 feet 6 inches tall, weight is 144 pounds.  Preop bra = 38 A.    Review of Systems  Constitutional: Negative for activity change and appetite change.  HENT: Negative for congestion and facial swelling.   Respiratory: Negative for chest tightness, shortness of breath, wheezing and stridor.   Cardiovascular: Negative for chest pain and leg swelling.  Gastrointestinal: Negative for abdominal distention.  Endocrine: Negative for cold intolerance.  Musculoskeletal: Negative for back pain and joint swelling.  Skin: Negative for color change, pallor and rash.  Neurological: Negative for dizziness.  Psychiatric/Behavioral: Negative for agitation.      Objective:   Physical Exam  Constitutional: She appears well-developed and well-nourished.  HENT:  Head: Normocephalic and atraumatic.  Eyes: Pupils are equal, round, and reactive to light.  Neck: Normal range of motion.  Cardiovascular: Normal rate and regular rhythm.  Pulmonary/Chest: No respiratory distress.  Abdominal: Soft.  Skin: Skin is warm. No rash noted. No erythema. No pallor.  Psychiatric: She has a normal mood and affect. Her behavior is normal. Thought content normal.      Assessment & Plan:  History of breast cancer in female  History of bilateral mastectomy  Acquired absence of breast, bilateral    Plan for bilateral removal of expanders and placement of silicone implants.  Coordinate with Dr. Threasa Heads if possible.  She would like the last week of October or first week of November.

## 2018-02-04 MED FILL — CAPECITABINE 500 MG TABS: 500 | 14 days supply | Qty: 84 | Fill #4

## 2018-02-04 MED FILL — CAPECITABINE 150 MG TABS: 150 | 14 days supply | Qty: 56 | Fill #4

## 2018-02-09 ENCOUNTER — Inpatient Hospital Stay (HOSPITAL_BASED_OUTPATIENT_CLINIC_OR_DEPARTMENT_OTHER): Admitting: Internal Medicine

## 2018-02-09 ENCOUNTER — Inpatient Hospital Stay: Attending: Internal Medicine

## 2018-02-09 ENCOUNTER — Inpatient Hospital Stay

## 2018-02-09 VITALS — BP 118/76 | HR 76 | Resp 16 | Wt 150.0 lb

## 2018-02-09 DIAGNOSIS — Z23 Encounter for immunization: Secondary | ICD-10-CM | POA: Insufficient documentation

## 2018-02-09 DIAGNOSIS — C50211 Malignant neoplasm of upper-inner quadrant of right female breast: Secondary | ICD-10-CM | POA: Diagnosis present

## 2018-02-09 DIAGNOSIS — L271 Localized skin eruption due to drugs and medicaments taken internally: Secondary | ICD-10-CM | POA: Diagnosis not present

## 2018-02-09 DIAGNOSIS — Z79899 Other long term (current) drug therapy: Secondary | ICD-10-CM | POA: Insufficient documentation

## 2018-02-09 DIAGNOSIS — Z87891 Personal history of nicotine dependence: Secondary | ICD-10-CM | POA: Insufficient documentation

## 2018-02-09 DIAGNOSIS — G4700 Insomnia, unspecified: Secondary | ICD-10-CM

## 2018-02-09 DIAGNOSIS — Z171 Estrogen receptor negative status [ER-]: Secondary | ICD-10-CM | POA: Insufficient documentation

## 2018-02-09 LAB — CBC WITH DIFFERENTIAL/PLATELET
Abs Immature Granulocytes: 0.02 10*3/uL (ref 0.00–0.07)
BASOS ABS: 0 10*3/uL (ref 0.0–0.1)
Basophils Relative: 1 %
EOS ABS: 0.4 10*3/uL (ref 0.0–0.5)
EOS PCT: 8 %
HEMATOCRIT: 36 % (ref 36.0–46.0)
Hemoglobin: 12.3 g/dL (ref 12.0–15.0)
Immature Granulocytes: 0 %
Lymphocytes Relative: 25 %
Lymphs Abs: 1.4 10*3/uL (ref 0.7–4.0)
MCH: 35.8 pg — ABNORMAL HIGH (ref 26.0–34.0)
MCHC: 34.2 g/dL (ref 30.0–36.0)
MCV: 104.7 fL — ABNORMAL HIGH (ref 80.0–100.0)
MONO ABS: 0.6 10*3/uL (ref 0.1–1.0)
Monocytes Relative: 12 %
NRBC: 0 % (ref 0.0–0.2)
Neutro Abs: 2.9 10*3/uL (ref 1.7–7.7)
Neutrophils Relative %: 54 %
Platelets: 183 10*3/uL (ref 150–400)
RBC: 3.44 MIL/uL — AB (ref 3.87–5.11)
RDW: 17.8 % — AB (ref 11.5–15.5)
WBC: 5.4 10*3/uL (ref 4.0–10.5)

## 2018-02-09 LAB — COMPREHENSIVE METABOLIC PANEL
ALBUMIN: 4.5 g/dL (ref 3.5–5.0)
ALK PHOS: 113 U/L (ref 38–126)
ALT: 17 U/L (ref 0–44)
ANION GAP: 8 (ref 5–15)
AST: 24 U/L (ref 15–41)
BILIRUBIN TOTAL: 0.8 mg/dL (ref 0.3–1.2)
BUN: 16 mg/dL (ref 6–20)
CALCIUM: 9.5 mg/dL (ref 8.9–10.3)
CO2: 29 mmol/L (ref 22–32)
Chloride: 101 mmol/L (ref 98–111)
Creatinine, Ser: 0.78 mg/dL (ref 0.44–1.00)
GFR calc non Af Amer: >60 mL/min (ref >60)
GLUCOSE: 90 mg/dL (ref 70–99)
POTASSIUM: 4.7 mmol/L (ref 3.5–5.1)
SODIUM: 138 mmol/L (ref 135–145)
TOTAL PROTEIN: 7.5 g/dL (ref 6.5–8.1)

## 2018-02-09 MED ORDER — INFLUENZA VAC SPLIT QUAD 0.5 ML IM SUSY
0.5000 mL | PREFILLED_SYRINGE | Freq: Once | INTRAMUSCULAR | Status: AC
  Administered 2018-02-09: 0.5 mL via INTRAMUSCULAR
  Filled 2018-02-09: qty 0.5

## 2018-02-09 MED ORDER — ZOLPIDEM TARTRATE 5 MG PO TABS: 5.0000 mg | ORAL_TABLET | Freq: Every evening | ORAL | 0 refills | Status: DC | PRN

## 2018-02-09 NOTE — Progress Notes (Signed)
[facility] OFFICE PROGRESS NOTE  Patient Care Team: Patient, No Pcp Per as PCP - General (General Practice) Parker, Holly Dad, MD as Consulting Physician (Oncology) Marga Hoots, MD (Obstetrics and Gynecology) Dillingham, Loel Lofty, DO as Attending Physician (Plastic Surgery) Coralie Keens, MD as Consulting Physician (General Surgery) Cammie Sickle, MD as Consulting Physician (Internal Medicine) Rico Junker, RN as Oncology Nurse Navigator Jovita Kussmaul, MD as Consulting Physician (General Surgery)  Cancer Staging No matching staging information was found for the patient.   Oncology History   # OCT 2018- RIGHT BREAST UIQ -[place]; TRIPLE NEGATIVE; ki-67-high [634m-1'O clock-Bx- proven; [place]]; another- 12'O clock-452m Not biopsied. [s/p Dr.Magrinaut; [place]]; cT1c(m) cN0;  G-3; ki-67- 80%  # 11/218- ddACesolved;  x4 cyles- US/ammo- improved 34m334mothe leison-r-Taxol+carbo [finished ealry April 2019]  # MAY 2019- s/p Mastec& SLNBx [Dr.Toth; [place]]; 6mm76msidual ca; 3 SLN-NEG [ypT1bsnN0]  # BRCA-1 Gene mutated [mom-ovarian cancer-Stage IIIB/Brca; older sister- breast ca/brca-mutated]  # MUGA scan [nov [id number]]%; UPBEAT clinical trial; port explanted.   # TAH &BSO [planned on Oct 29th/Dr.Pearson; reconstruction- dec 3rd] --------------------------------------------------------   DIAGNOSIS: TRIPLE NEGATIVE BREAST CA  STAGE:  I   ; GOALS: CURATIVE  CURRENT/MOST RECENT THERAPY; Xeloda       Malignant neoplasm of upper-inner quadrant of right breast in female, estrogen receptor negative (HCC)[name]   INTERVAL HISTORY:  Holly Parker.  female pleasant patient above history of stage I triple negative breast cancer currently on adjuvant Xeloda is here for follow-up.  In the interim patient [facility]hysterectomy in the end of this month given her BRCA positive status.   Her Patient also met with plastic surgery awaiting reconstruction surgery.  Patient also had the surgeries done before the end of the year/because of financial issues.  Patient complains of rash on palms and soles especially the feet; currently improved.  Otherwise no diarrhea.  No sores in the mouth.  Review of Systems  Constitutional: Negative for chills, diaphoresis, fever, malaise/fatigue and weight loss.  HENT: Negative for nosebleeds and sore throat.   Eyes: Negative for double vision.  Respiratory: Negative for cough, hemoptysis, sputum production, shortness of breath and wheezing.   Cardiovascular: Negative for chest pain, palpitations, orthopnea and leg swelling.  Gastrointestinal: Negative for abdominal pain, blood in stool, constipation, diarrhea, heartburn, melena, nausea and vomiting.  Genitourinary: Negative for dysuria, frequency and urgency.  Musculoskeletal: Negative for back pain and joint pain.  Neurological: Negative for dizziness, tingling, focal weakness, weakness and headaches.  Endo/Heme/Allergies: Does not bruise/bleed easily.  Psychiatric/Behavioral: Negative for depression. The patient is not nervous/anxious and does not have insomnia.       PAST MEDICAL HISTORY :  Past Medical History:  Diagnosis Date  . Anemia    iron deficiency during chemotherapy  . Anxiety   . Asthma    Excercise induced as a child  . Fibromyalgia   . Headache    migraine headaches due to sinus/allergies and teeth grinding at night  . Malignant neoplasm of upper-inner quadrant of right breast in female, estrogen receptor negative (HCC)[place]   right breast  . Personal history of chemotherapy    Right breast- 4 treatments    PAST SURGICAL HISTORY :   Past Surgical History:  Procedure Laterality Date  . BREAST RECONSTRUCTION WITH PLACEMENT OF TISSUE EXPANDER AND FLEX HD (ACELLULAR HYDRATED DERMIS) Bilateral 09/15/2017   Procedure: BREAST  RECONSTRUCTION WITH PLACEMENT OF TISSUE  EXPANDER AND FLEX HD (ACELLULAR HYDRATED DERMIS);  Surgeon: Wallace Going, DO;  Location: [facility] ORS;  Service: Plastics;  Laterality: Bilateral;  . IR IMAGING GUIDED PORT INSERTION  03/17/2017  . KNEE SURGERY Left 2009   Torn meniscus   . NIPPLE SPARING MASTECTOMY/SENTINAL LYMPH NODE BIOPSY/RECONSTRUCTION/PLACEMENT OF TISSUE EXPANDER Bilateral 09/15/2017   Procedure: BILATERAL NIPPLE SPARING MASTECTOMIES  WITH RIGHT  SENTINAL LYMPH NODE BIOPSY;  Surgeon: Jovita Kussmaul, MD;  Location: [facility] ORS;  Service: General;  Laterality: Bilateral;  . PORT-A-CATH REMOVAL N/A 09/15/2017   Procedure: REMOVAL PORT-A-CATH;  Surgeon: Jovita Kussmaul, MD;  Location: [facility] ORS;  Service: General;  Laterality: N/A;  . RHINOPLASTY  [id number]   both surgeries due to broken nose    FAMILY HISTORY :   Family History  Problem Relation Age of Onset  . Cancer Mother        Ovarian cancer (2003), breast cancer(2017) & squamous cell   . Leukemia Mother   . Breast cancer Mother 86  . Cancer Sister        breast cancer  . Breast cancer Sister 13  . Melanoma Father   . Lung cancer Paternal Grandmother     SOCIAL HISTORY:   Social History   Tobacco Use  . Smoking status: Former Smoker    Packs/day: 0.25    Types: Cigarettes    Last attempt to quit: 09/16/2011    Years since quitting: 6.4  . Smokeless tobacco: Never Used  Substance Use Topics  . Alcohol use: No  . Drug use: No    ALLERGIES:  is allergic to seasonal ic [cholestatin].  MEDICATIONS:  Current Outpatient Medications  Medication Sig Dispense Refill  . acetaminophen (TYLENOL) 325 MG tablet Take 1 tablet (325 mg total) by mouth every 6 (six) hours.    Marland Kitchen azithromycin (ZITHROMAX) 250 MG tablet 500 mg PO x1 on day 1, then 250 mg PO q24h x4 days 6 each 0  . capecitabine (XELODA) 150 MG tablet Take 2 tablets (300 mg total) by mouth 2 (two) times daily after a meal. Take for 14 days on, then 7 days off. Take with three 524m tablets 56 tablet 5  .  capecitabine (XELODA) 500 MG tablet Take 3 tablets (1,500 mg total) by mouth 2 (two) times daily after a meal. Take for 14 days on, then 7 days off. Take with two 158mtablets 84 tablet 5  . cromolyn (NASALCROM) 5.2 MG/ACT nasal spray Place 1 spray into both nostrils 4 (four) times daily as needed for allergies or rhinitis. 26 mL 0  . diazepam (VALIUM) 2 MG tablet Take 1 tablet (2 mg total) by mouth every 8 (eight) hours as needed for muscle spasms. 30 tablet 0  . diphenhydrAMINE (BENADRYL) 12.5 MG/5ML elixir Take 5 mLs (12.5 mg total) by mouth every 6 (six) hours as needed for itching. 120 mL 0  . fluticasone (FLONASE) 50 MCG/ACT nasal spray Place 1 spray into both nostrils daily as needed for allergies.    . Marland Kitchenoratadine (CLARITIN) 10 MG tablet Take 10 mg daily by mouth.    . Multiple Vitamin (MULTIVITAMIN WITH MINERALS) TABS tablet Take 1 tablet by mouth daily. One-A-Day    . naproxen (NAPROSYN) 500 MG tablet Take 1 tablet (500 mg total) by mouth 2 (two) times daily as needed for mild pain.    . Marland Kitchenndansetron (ZOFRAN) 4 MG tablet Take 1 tablet (4 mg total) by mouth daily as needed for  nausea or vomiting. 10 tablet 1  . polyethylene glycol (MIRALAX / GLYCOLAX) packet Take 17 g by mouth daily as needed for mild constipation. 14 each 0  . senna (SENOKOT) 8.6 MG TABS tablet Take 1 tablet (8.6 mg total) by mouth 2 (two) times daily. 120 each 0  . sertraline (ZOLOFT) 100 MG tablet Take 1 tablet (100 mg total) by mouth at bedtime. 30 tablet 4  . triamcinolone ointment (KENALOG) 0.5 % Apply 1 application topically 2 (two) times daily. 30 g 0  . urea (CARMOL) 10 % cream Apply topically as needed. 71 g 0  . zolpidem (AMBIEN) 5 MG tablet Take 1 tablet (5 mg total) by mouth at bedtime as needed for sleep. 30 tablet 0  . HYDROcodone-acetaminophen (NORCO/VICODIN) 5-325 MG tablet Take 1 tablet by mouth every 6 (six) hours as needed.    . pseudoephedrine-acetaminophen (TYLENOL SINUS) 30-500 MG TABS tablet Take 1-2  tablets by mouth every 4 (four) hours as needed (for congestion/allergies.).     No current facility-administered medications for this visit.     PHYSICAL EXAMINATION: ECOG PERFORMANCE STATUS: 0 - Asymptomatic  BP 118/76 (BP Location: Left Arm, Patient Position: Sitting)   Pulse 76   Resp 16   Wt 150 lb (68 kg)   BMI 23.49 kg/m   Filed Weights   02/09/18 1038  Weight: 150 lb (68 kg)    Physical Exam  Constitutional: She is oriented to person, place, and time and well-developed, well-nourished, and in no distress.  HENT:  Head: Normocephalic and atraumatic.  Mouth/Throat: Oropharynx is clear and moist. No oropharyngeal exudate.  Eyes: Pupils are equal, round, and reactive to light.  Neck: Normal range of motion. Neck supple.  Cardiovascular: Normal rate and regular rhythm.  Pulmonary/Chest: No respiratory distress. She has no wheezes.  Abdominal: Soft. Bowel sounds are normal. She exhibits no distension and no mass. There is no tenderness. There is no rebound and no guarding.  Musculoskeletal: Normal range of motion. She exhibits no edema or tenderness.  Neurological: She is alert and oriented to person, place, and time.  Skin: Skin is warm.  Psychiatric: Affect normal.      LABORATORY DATA:  I have reviewed the data as listed    Component Value Date/Time   NA 138 02/09/2018 1014   K 4.7 02/09/2018 1014   CL 101 02/09/2018 1014   CO2 29 02/09/2018 1014   GLUCOSE 90 02/09/2018 1014   BUN 16 02/09/2018 1014   CREATININE 0.78 02/09/2018 1014   CALCIUM 9.5 02/09/2018 1014   PROT 7.5 02/09/2018 1014   ALBUMIN 4.5 02/09/2018 1014   AST 24 02/09/2018 1014   ALT 17 02/09/2018 1014   ALKPHOS 113 02/09/2018 1014   BILITOT 0.8 02/09/2018 1014   GFRNONAA >60 02/09/2018 1014   GFRAA >60 02/09/2018 1014    No results found for: SPEP, UPEP  Lab Results  Component Value Date   WBC 5.4 02/09/2018   NEUTROABS 2.9 02/09/2018   HGB 12.3 02/09/2018   HCT 36.0 02/09/2018    MCV 104.7 (H) 02/09/2018   PLT 183 02/09/2018      Chemistry      Component Value Date/Time   NA 138 02/09/2018 1014   K 4.7 02/09/2018 1014   CL 101 02/09/2018 1014   CO2 29 02/09/2018 1014   BUN 16 02/09/2018 1014   CREATININE 0.78 02/09/2018 1014      Component Value Date/Time   CALCIUM 9.5 02/09/2018 1014   ALKPHOS  113 02/09/2018 1014   AST 24 02/09/2018 1014   ALT 17 02/09/2018 1014   BILITOT 0.8 02/09/2018 1014       RADIOGRAPHIC STUDIES: I have personally reviewed the radiological images as listed and agreed with the findings in the report. No results found.   ASSESSMENT & PLAN:  Malignant neoplasm of upper-inner quadrant of right breast in female, estrogen receptor negative ([place]) # Stage I triple negative breast cancer; post neoadjuvant chemotherapy- 6 mm residual disease.  Currently on adjuvant Xeloda.  Stable s/p cycle #4.    # recommend starting #5 on today oct 14th. Labs today reviewed;  acceptable for treatment today.  # hand foot syndrome- G-1-2;sec to xeloda-  on urea/ vaseline.  Stable  # Insomnia-continue Ambien; stable new prescription given.  # flu shot today; will plan pneumoani shot after finishing adjuvant therapy.   #Prophylactic bilateral oophorectomy/bilateral breast reconstruction surgery-discussed that ideally I would want the surgeries done after her adjuvant chemotherapyhowever patient  wants to have the surgery is done sooner because of financial issues.  I think is reasonable as her blood counts are fairly stable.  Patient is awaiting to have TAH plus BSO October 29th-which is her week off xeloda.   # follow up in 3 weeks/labs- cbc/cmp/Nov 4th/MD     No orders of the defined types were placed in this encounter.  All questions were answered. The patient knows to call the clinic with any problems, questions or concerns.      Cammie Sickle, MD 02/09/2018 8:39 PM

## 2018-02-09 NOTE — Assessment & Plan Note (Addendum)
#   Stage I triple negative breast cancer; post neoadjuvant chemotherapy- 6 mm residual disease.  Currently on adjuvant Xeloda.  Stable s/p cycle #4.    # recommend starting #5 on today oct 14th. Labs today reviewed;  acceptable for treatment today.  # hand foot syndrome- G-1-2;sec to xeloda-  on urea/ vaseline.  Stable  # Insomnia-continue Ambien; stable new prescription given.  # flu shot today; will plan pneumoani shot after finishing adjuvant therapy.   #Prophylactic bilateral oophorectomy/bilateral breast reconstruction surgery-discussed that ideally I would want the surgeries done after her adjuvant chemotherapyhowever patient  wants to have the surgery is done sooner because of financial issues.  I think is reasonable as her blood counts are fairly stable.  Patient is awaiting to have TAH plus BSO October 29th-which is her week off xeloda.   # follow up in 3 weeks/labs- cbc/cmp/Nov 4th/MD

## 2018-02-18 ENCOUNTER — Encounter

## 2018-02-25 MED FILL — CAPECITABINE 150 MG TABS: 150 | 14 days supply | Qty: 56 | Fill #5

## 2018-02-25 MED FILL — CAPECITABINE 500 MG TABS: 500 | 14 days supply | Qty: 84 | Fill #5

## 2018-03-02 ENCOUNTER — Other Ambulatory Visit: Payer: Self-pay

## 2018-03-02 ENCOUNTER — Inpatient Hospital Stay: Attending: Internal Medicine

## 2018-03-02 ENCOUNTER — Inpatient Hospital Stay (HOSPITAL_BASED_OUTPATIENT_CLINIC_OR_DEPARTMENT_OTHER): Admitting: Internal Medicine

## 2018-03-02 VITALS — BP 111/68 | HR 88 | Temp 96.6°F | Resp 18 | Wt 149.4 lb

## 2018-03-02 DIAGNOSIS — Z87891 Personal history of nicotine dependence: Secondary | ICD-10-CM | POA: Insufficient documentation

## 2018-03-02 DIAGNOSIS — C50211 Malignant neoplasm of upper-inner quadrant of right female breast: Secondary | ICD-10-CM

## 2018-03-02 DIAGNOSIS — Z171 Estrogen receptor negative status [ER-]: Secondary | ICD-10-CM

## 2018-03-02 DIAGNOSIS — L271 Localized skin eruption due to drugs and medicaments taken internally: Secondary | ICD-10-CM

## 2018-03-02 DIAGNOSIS — Z90710 Acquired absence of both cervix and uterus: Secondary | ICD-10-CM | POA: Diagnosis not present

## 2018-03-02 LAB — CBC WITH DIFFERENTIAL/PLATELET
ABS IMMATURE GRANULOCYTES: 0.05 10*3/uL (ref 0.00–0.07)
BASOS ABS: 0 10*3/uL (ref 0.0–0.1)
Basophils Relative: 0 %
EOS ABS: 0.8 10*3/uL — AB (ref 0.0–0.5)
Eosinophils Relative: 11 %
HEMATOCRIT: 34.2 % — AB (ref 36.0–46.0)
Hemoglobin: 11.7 g/dL — ABNORMAL LOW (ref 12.0–15.0)
IMMATURE GRANULOCYTES: 1 %
Lymphocytes Relative: 13 %
Lymphs Abs: 1 10*3/uL (ref 0.7–4.0)
MCH: 36.9 pg — ABNORMAL HIGH (ref 26.0–34.0)
MCHC: 34.2 g/dL (ref 30.0–36.0)
MCV: 107.9 fL — ABNORMAL HIGH (ref 80.0–100.0)
MONOS PCT: 11 %
Monocytes Absolute: 0.9 10*3/uL (ref 0.1–1.0)
NEUTROS ABS: 4.7 10*3/uL (ref 1.7–7.7)
Neutrophils Relative %: 64 %
PLATELETS: 188 10*3/uL (ref 150–400)
RBC: 3.17 MIL/uL — ABNORMAL LOW (ref 3.87–5.11)
RDW: 15.9 % — AB (ref 11.5–15.5)
WBC: 7.5 10*3/uL (ref 4.0–10.5)
nRBC: 0 % (ref 0.0–0.2)

## 2018-03-02 LAB — COMPREHENSIVE METABOLIC PANEL
ALBUMIN: 4.3 g/dL (ref 3.5–5.0)
ALT: 53 U/L — ABNORMAL HIGH (ref 0–44)
ANION GAP: 8 (ref 5–15)
AST: 31 U/L (ref 15–41)
Alkaline Phosphatase: 115 U/L (ref 38–126)
BILIRUBIN TOTAL: 1 mg/dL (ref 0.3–1.2)
BUN: 17 mg/dL (ref 6–20)
CO2: 28 mmol/L (ref 22–32)
Calcium: 9.3 mg/dL (ref 8.9–10.3)
Chloride: 103 mmol/L (ref 98–111)
Creatinine, Ser: 0.59 mg/dL (ref 0.44–1.00)
GFR calc Af Amer: >60 mL/min (ref >60)
GFR calc non Af Amer: >60 mL/min (ref >60)
GLUCOSE: 90 mg/dL (ref 70–99)
POTASSIUM: 4.5 mmol/L (ref 3.5–5.1)
SODIUM: 139 mmol/L (ref 135–145)
TOTAL PROTEIN: 7.2 g/dL (ref 6.5–8.1)

## 2018-03-02 NOTE — Patient Instructions (Signed)
#  Start taking Xeloda 23rd through 29th and then stop in anticipation of your breast reconstruction surgery. Dr.B

## 2018-03-02 NOTE — Progress Notes (Signed)
Here for follow up. Stated feeling well after hysterectomy last Tuesday -abd and lap.  Xeloda did not arrive despite call to confirm to receipt.  Per pt bilateral hand and feet pain- level 3, achy tingling pain  Constant she stated

## 2018-03-02 NOTE — Assessment & Plan Note (Addendum)
#   Stage I triple negative breast cancer; post neoadjuvant chemotherapy- 6 mm residual disease.  Currently on adjuvant Xeloda.  Stable.  S/p cycle #5.    # recommend starting #6 [not delivered] Labs today reviewed;  acceptable for treatment today.  Discussed that she will take only 1 week of treatment next cycle [23rd through the 29th]; given her upcoming breast reconstruction surgery.  # hand foot syndrome- G-1;sec to xeloda-  on urea/ vaseline.  Stable  # Insomnia-continue Ambien; stable.   #Prophylactic bilateral oophorectomy-oct 29 th 2019. bilateral breast reconstruction surgery on dec 4th. #Start taking Xeloda 23rd through 29th and then stop in anticipation of your breast reconstruction surgery.   # follow up in dec 9th-MDlabs- cbc/cmp- Dr.B

## 2018-03-02 NOTE — Progress Notes (Signed)
[place] OFFICE PROGRESS NOTE  Patient Care Team: Patient, No Pcp Per as PCP - General (General Practice) Magrinat, Virgie Dad, MD as Consulting Physician (Oncology) Marga Hoots, MD (Obstetrics and Gynecology) Dillingham, Loel Lofty, DO as Attending Physician (Plastic Surgery) Coralie Keens, MD as Consulting Physician (General Surgery) Cammie Sickle, MD as Consulting Physician (Internal Medicine) Rico Junker, RN as Oncology Nurse Navigator Jovita Kussmaul, MD as Consulting Physician (General Surgery)  Cancer Staging No matching staging information was found for the patient.   Oncology History   # OCT 2018- RIGHT BREAST UIQ -[place]; TRIPLE NEGATIVE; ki-67-high [98m-1'O clock-Bx- proven; [place]]; another- 12'O clock-451m Not biopsied. [s/p Dr.Magrinaut; [place]]; cT1c(m) cN0;  G-3; ki-67- 80%  # 11/218- ddACesolved;  x4 cyles- US/ammo- improved 6m33mothe leison-r-Taxol+carbo [finished ealry April 2019]  # MAY 2019- s/p Mastec& SLNBx [Dr.Toth; [place]]; 6mm1msidual ca; 3 SLN-NEG [ypT1bsnN0]  # BRCA-1 Gene mutated [mom-ovarian cancer-Stage IIIB/Brca; older sister- breast ca/brca-mutated]  # MUGA scan [nov [id number]]%; UPBEAT clinical trial; port explanted.   # TAH &BSO [s/p Oct 29th/Dr.Pearson; breast recon reconstruction- dec 3rd] --------------------------------------------------------   DIAGNOSIS: TRIPLE NEGATIVE BREAST CA  STAGE:  I   ; GOALS: CURATIVE  CURRENT/MOST RECENT THERAPY; Xeloda       Malignant neoplasm of upper-inner quadrant of right breast in female, estrogen receptor negative (HCC)[place]   INTERVAL HISTORY:  H[place] SCAT[age]ROy51.  female pleasant patient above history of stage I triple negative breast cancer currently on adjuvant Xeloda is here for follow-up.  In interim patient underwent TAH/BSO prophylactic.  Surgery was uneventful. I reviewed the pathology from TAH and BSO.  No evidence of malignancy.  Patient is awaiting  to have her bilateral reconstruction surgery on December 3rd.   Continues to have mild rash and soreness of her hands.  Otherwise no new lumps or bumps.  Appetite is good.  No weight loss.  No diarrhea.  Review of Systems  Constitutional: Negative for chills, diaphoresis, fever, malaise/fatigue and weight loss.  HENT: Negative for nosebleeds and sore throat.   Eyes: Negative for double vision.  Respiratory: Negative for cough, hemoptysis, sputum production, shortness of breath and wheezing.   Cardiovascular: Negative for chest pain, palpitations, orthopnea and leg swelling.  Gastrointestinal: Negative for abdominal pain, blood in stool, constipation, diarrhea, heartburn, melena, nausea and vomiting.  Genitourinary: Negative for dysuria, frequency and urgency.  Musculoskeletal: Negative for back pain and joint pain.  Skin: Positive for rash.       Mild peeling of the skin on hands.  Neurological: Negative for dizziness, tingling, focal weakness, weakness and headaches.  Endo/Heme/Allergies: Does not bruise/bleed easily.  Psychiatric/Behavioral: Negative for depression. The patient is not nervous/anxious and does not have insomnia.       PAST MEDICAL HISTORY :  Past Medical History:  Diagnosis Date  . Anemia    iron deficiency during chemotherapy  . Anxiety   . Asthma    Excercise induced as a child  . Fibromyalgia   . Headache    migraine headaches due to sinus/allergies and teeth grinding at night  . Malignant neoplasm of upper-inner quadrant of right breast in female, estrogen receptor negative (HCC)[id number]   right breast  . Personal history of chemotherapy    Right breast- 4 treatments    PAST SURGICAL HISTORY :   Past Surgical History:  Procedure Laterality Date  . BREAST RECONSTRUCTION WITH PLACEMENT OF TISSUE EXPANDER AND FLEX HD (ACELLULAR  HYDRATED DERMIS) Bilateral 09/15/2017   Procedure: BREAST RECONSTRUCTION WITH PLACEMENT OF TISSUE EXPANDER AND FLEX HD (ACELLULAR  HYDRATED DERMIS);  Surgeon: Wallace Going, DO;  Location: [facility] ORS;  Service: Plastics;  Laterality: Bilateral;  . IR IMAGING GUIDED PORT INSERTION  03/17/2017  . KNEE SURGERY Left 2009   Torn meniscus   . NIPPLE SPARING MASTECTOMY/SENTINAL LYMPH NODE BIOPSY/RECONSTRUCTION/PLACEMENT OF TISSUE EXPANDER Bilateral 09/15/2017   Procedure: BILATERAL NIPPLE SPARING MASTECTOMIES  WITH RIGHT  SENTINAL LYMPH NODE BIOPSY;  Surgeon: Jovita Kussmaul, MD;  Location: [facility] ORS;  Service: General;  Laterality: Bilateral;  . PORT-A-CATH REMOVAL N/A 09/15/2017   Procedure: REMOVAL PORT-A-CATH;  Surgeon: Jovita Kussmaul, MD;  Location: [facility] ORS;  Service: General;  Laterality: N/A;  . RHINOPLASTY  [id number]   both surgeries due to broken nose    FAMILY HISTORY :   Family History  Problem Relation Age of Onset  . Cancer Mother        Ovarian cancer (2003), breast cancer(2017) & squamous cell   . Leukemia Mother   . Breast cancer Mother 26  . Cancer Sister        breast cancer  . Breast cancer Sister 41  . Melanoma Father   . Lung cancer Paternal Grandmother     SOCIAL HISTORY:   Social History   Tobacco Use  . Smoking status: Former Smoker    Packs/day: 0.25    Types: Cigarettes    Last attempt to quit: 09/16/2011    Years since quitting: 6.4  . Smokeless tobacco: Never Used  Substance Use Topics  . Alcohol use: No  . Drug use: No    ALLERGIES:  is allergic to seasonal ic [cholestatin].  MEDICATIONS:  Current Outpatient Medications  Medication Sig Dispense Refill  . acetaminophen (TYLENOL) 325 MG tablet Take 1 tablet (325 mg total) by mouth every 6 (six) hours.    . cromolyn (NASALCROM) 5.2 MG/ACT nasal spray Place 1 spray into both nostrils 4 (four) times daily as needed for allergies or rhinitis. 26 mL 0  . loratadine (CLARITIN) 10 MG tablet Take 10 mg daily by mouth.    . Multiple Vitamin (MULTIVITAMIN WITH MINERALS) TABS tablet Take 1 tablet by mouth daily. One-A-Day    . naproxen  (NAPROSYN) 500 MG tablet Take 1 tablet (500 mg total) by mouth 2 (two) times daily as needed for mild pain.    . polyethylene glycol (MIRALAX / GLYCOLAX) packet Take 17 g by mouth daily as needed for mild constipation. 14 each 0  . senna (SENOKOT) 8.6 MG TABS tablet Take 1 tablet (8.6 mg total) by mouth 2 (two) times daily. 120 each 0  . sertraline (ZOLOFT) 100 MG tablet Take 1 tablet (100 mg total) by mouth at bedtime. 30 tablet 4  . triamcinolone ointment (KENALOG) 0.5 % Apply 1 application topically 2 (two) times daily. 30 g 0  . urea (CARMOL) 10 % cream Apply topically as needed. 71 g 0  . zolpidem (AMBIEN) 5 MG tablet Take 1 tablet (5 mg total) by mouth at bedtime as needed for sleep. 30 tablet 0  . capecitabine (XELODA) 150 MG tablet Take 2 tablets (300 mg total) by mouth 2 (two) times daily after a meal. Take for 14 days on, then 7 days off. Take with three 559m tablets (Patient not taking: Reported on 03/02/2018) 56 tablet 5  . capecitabine (XELODA) 500 MG tablet Take 3 tablets (1,500 mg total) by mouth 2 (two) times daily after a  meal. Take for 14 days on, then 7 days off. Take with two 118m tablets (Patient not taking: Reported on 03/02/2018) 84 tablet 5  . diazepam (VALIUM) 2 MG tablet Take 1 tablet (2 mg total) by mouth every 8 (eight) hours as needed for muscle spasms. (Patient not taking: Reported on 03/02/2018) 30 tablet 0  . fluticasone (FLONASE) 50 MCG/ACT nasal spray Place 1 spray into both nostrils daily as needed for allergies.    .Marland KitchenHYDROcodone-acetaminophen (NORCO/VICODIN) 5-325 MG tablet Take 1 tablet by mouth every 6 (six) hours as needed.    . ondansetron (ZOFRAN) 4 MG tablet Take 1 tablet (4 mg total) by mouth daily as needed for nausea or vomiting. (Patient not taking: Reported on 03/02/2018) 10 tablet 1  . pseudoephedrine-acetaminophen (TYLENOL SINUS) 30-500 MG TABS tablet Take 1-2 tablets by mouth every 4 (four) hours as needed (for congestion/allergies.).     No current  facility-administered medications for this visit.     PHYSICAL EXAMINATION: ECOG PERFORMANCE STATUS: 0 - Asymptomatic  BP 111/68 (BP Location: Left Arm, Patient Position: Sitting)   Pulse 88   Temp (!) 96.6 F (35.9 C) (Tympanic)   Resp 18   Wt 149 lb 6.4 oz (67.8 kg)   BMI 23.40 kg/m   Filed Weights   03/02/18 1001  Weight: 149 lb 6.4 oz (67.8 kg)    Physical Exam  Constitutional: She is oriented to person, place, and time and well-developed, well-nourished, and in no distress.  HENT:  Head: Normocephalic and atraumatic.  Mouth/Throat: Oropharynx is clear and moist. No oropharyngeal exudate.  Eyes: Pupils are equal, round, and reactive to light.  Neck: Normal range of motion. Neck supple.  Cardiovascular: Normal rate and regular rhythm.  Pulmonary/Chest: No respiratory distress. She has no wheezes.  Abdominal: Soft. Bowel sounds are normal. She exhibits no distension and no mass. There is no tenderness. There is no rebound and no guarding.  Musculoskeletal: Normal range of motion. She exhibits no edema or tenderness.  Neurological: She is alert and oriented to person, place, and time.  Skin: Skin is warm.  Mild desquamation of hands.  Psychiatric: Affect normal.      LABORATORY DATA:  I have reviewed the data as listed    Component Value Date/Time   NA 139 03/02/2018 0940   K 4.5 03/02/2018 0940   CL 103 03/02/2018 0940   CO2 28 03/02/2018 0940   GLUCOSE 90 03/02/2018 0940   BUN 17 03/02/2018 0940   CREATININE 0.59 03/02/2018 0940   CALCIUM 9.3 03/02/2018 0940   PROT 7.2 03/02/2018 0940   ALBUMIN 4.3 03/02/2018 0940   AST 31 03/02/2018 0940   ALT 53 (H) 03/02/2018 0940   ALKPHOS 115 03/02/2018 0940   BILITOT 1.0 03/02/2018 0940   GFRNONAA >60 03/02/2018 0940   GFRAA >60 03/02/2018 0940    No results found for: SPEP, UPEP  Lab Results  Component Value Date   WBC 7.5 03/02/2018   NEUTROABS 4.7 03/02/2018   HGB 11.7 (L) 03/02/2018   HCT 34.2 (L)  03/02/2018   MCV 107.9 (H) 03/02/2018   PLT 188 03/02/2018      Chemistry      Component Value Date/Time   NA 139 03/02/2018 0940   K 4.5 03/02/2018 0940   CL 103 03/02/2018 0940   CO2 28 03/02/2018 0940   BUN 17 03/02/2018 0940   CREATININE 0.59 03/02/2018 0940      Component Value Date/Time   CALCIUM 9.3 03/02/2018 0940  ALKPHOS 115 03/02/2018 0940   AST 31 03/02/2018 0940   ALT 53 (H) 03/02/2018 0940   BILITOT 1.0 03/02/2018 0940       RADIOGRAPHIC STUDIES: I have personally reviewed the radiological images as listed and agreed with the findings in the report. No results found.   ASSESSMENT & PLAN:  Malignant neoplasm of upper-inner quadrant of right breast in female, estrogen receptor negative ([place]) # Stage I triple negative breast cancer; post neoadjuvant chemotherapy- 6 mm residual disease.  Currently on adjuvant Xeloda.  Stable.  S/p cycle #5.    # recommend starting #6 [not delivered] Labs today reviewed;  acceptable for treatment today.  Discussed that she will take only 1 week of treatment next cycle [23rd through the 29th]; given her upcoming breast reconstruction surgery.  # hand foot syndrome- G-1;sec to xeloda-  on urea/ vaseline.  Stable  # Insomnia-continue Ambien; stable.   #Prophylactic bilateral oophorectomy-oct 29 th 2019. bilateral breast reconstruction surgery on dec 4th. #Start taking Xeloda 23rd through 29th and then stop in anticipation of your breast reconstruction surgery.   # follow up in dec 9th-[facility]- cbc/cmp- Dr.B   Orders Placed This Encounter  Procedures  . CBC with Differential/Platelet    Standing Status:   Future    Standing Expiration Date:   03/03/2019  . Comprehensive metabolic panel    Standing Status:   Future    Standing Expiration Date:   03/03/2019   All questions were answered. The patient knows to call the clinic with any problems, questions or concerns.      Cammie Sickle, MD 03/02/2018 12:44 PM

## 2018-03-12 ENCOUNTER — Telehealth: Payer: Self-pay | Admitting: Pharmacist

## 2018-03-12 ENCOUNTER — Other Ambulatory Visit: Payer: Self-pay | Admitting: Internal Medicine

## 2018-03-12 ENCOUNTER — Other Ambulatory Visit: Payer: Self-pay | Admitting: *Deleted

## 2018-03-12 DIAGNOSIS — C50211 Malignant neoplasm of upper-inner quadrant of right female breast: Secondary | ICD-10-CM

## 2018-03-12 DIAGNOSIS — Z171 Estrogen receptor negative status [ER-]: Principal | ICD-10-CM

## 2018-03-12 NOTE — Telephone Encounter (Signed)
...  Ref Range & Units 10d ago  WBC 4.0 - 10.5 K/uL 7.5   RBC 3.87 - 5.11 MIL/uL 3.17Low    Hemoglobin 12.0 - 15.0 g/dL 11.7Low    HCT 36.0 - 46.0 % 34.2Low    MCV 80.0 - 100.0 fL 107.9High    MCH 26.0 - 34.0 pg 36.9High    MCHC 30.0 - 36.0 g/dL 34.2   RDW 11.5 - 15.5 % 15.9High    Platelets 150 - 400 K/uL 188   nRBC 0.0 - 0.2 % 0.0   Neutrophils Relative % % 64   Neutro Abs 1.7 - 7.7 K/uL 4.7   Lymphocytes Relative % 13   Lymphs Abs 0.7 - 4.0 K/uL 1.0   Monocytes Relative % 11   Monocytes Absolute 0.1 - 1.0 K/uL 0.9   Eosinophils Relative % 11   Eosinophils Absolute 0.0 - 0.5 K/uL 0.8High    Basophils Relative % 0   Basophils Absolute 0.0 - 0.1 K/uL 0.0   Immature Granulocytes % 1   Abs Immature Granulocytes 0.00 - 0.07 K/uL 0.05   Comment: Performed at [facility], [address] [place], [place] [address]  Resulting Agency  [facility]      Specimen Collected: 03/02/18 09:40 Last Resulted: 03/02/18 09:51       ...   Ref Range & Units 10d ago  Sodium 135 - 145 mmol/L 139   Potassium 3.5 - 5.1 mmol/L 4.5   Chloride 98 - 111 mmol/L 103   CO2 22 - 32 mmol/L 28   Glucose, Bld 70 - 99 mg/dL 90   BUN 6 - 20 mg/dL 17   Creatinine, Ser 0.44 - 1.00 mg/dL 0.59   Calcium 8.9 - 10.3 mg/dL 9.3   Total Protein 6.5 - 8.1 g/dL 7.2   Albumin 3.5 - 5.0 g/dL 4.3   AST 15 - 41 U/L 31   ALT 0 - 44 U/L 53High    Alkaline Phosphatase 38 - 126 U/L 115   Total Bilirubin 0.3 - 1.2 mg/dL 1.0   GFR calc non Af Amer >60 mL/min >60   GFR calc Af Amer >60 mL/min >60   Comment: (NOTE)  The eGFR has been calculated using the CKD EPI equation.  This calculation has not been validated in all clinical situations.  eGFR's persistently <60 mL/min signify possible Chronic Kidney  Disease.   Anion gap 5 - 15 8   Comment: Performed at [facility], [address] [place], [place] [address]  Resulting Agency  [facility] CLIN LAB      Specimen Collected: 03/02/18 09:40 Last Resulted:  03/02/18 10:02

## 2018-03-12 NOTE — Telephone Encounter (Signed)
Oral Chemotherapy Pharmacist Encounter  Follow-Up Form  Called patient today to follow up regarding patient's oral chemotherapy medication: Xeloda (capecitabine)  Original Start date of oral chemotherapy: 10/2017  Pt reports 2 doses of Xeloda missed so far this month.  Missed dose(s) attributed to: shipment delay and her father being in the hospital Reviewed plan for missed doses.   Pt reports the following side effects: diarrhea and mild hand-foot, she has been able to manage both  Recent labs reviewed: CMP from 03/02/18  New medications?: None reported  Other Issues: None reported  Patient knows to call the office with questions or concerns. Oral Oncology Clinic will continue to follow.  Darl Pikes, PharmD, BCPS, [facility] Hematology/Oncology Clinical Pharmacist [facility] [place] [facility] [phone]  03/12/2018 10:56 AM

## 2018-03-13 ENCOUNTER — Telehealth: Payer: Self-pay | Admitting: *Deleted

## 2018-03-13 DIAGNOSIS — Z171 Estrogen receptor negative status [ER-]: Principal | ICD-10-CM

## 2018-03-13 DIAGNOSIS — C50211 Malignant neoplasm of upper-inner quadrant of right female breast: Secondary | ICD-10-CM

## 2018-03-13 MED ORDER — ZOLPIDEM TARTRATE 5 MG PO TABS: 5.0000 mg | ORAL_TABLET | Freq: Every evening | ORAL | 0 refills | Status: DC | PRN

## 2018-03-13 NOTE — Telephone Encounter (Signed)
Patient called [facility] requesting refill of Ambien.   As mandated by the [place] STOP Act (Strengthen Opioid Misuse Prevention), the [place] Controlled Substance Reporting System ([place]) was reviewed for this patient.  Below is the past 37-months of controlled substance prescriptions as displayed by the registry.  I have personally consulted with my supervising physician, Dr. Rogue Bussing, who agrees that continuation of opiate therapy is medically appropriate at this time and agrees to provide continual monitoring, including urine/blood drug screens, as indicated. Prescription sent electronically using [employer] secure transmission to requested pharmacy.   [place] Reviewed:     Beckey Rutter, DNP, AGNP-C [place] at [facility] [phone] (work cell) [phone] (office) 03/13/18 12:18 PM

## 2018-03-13 NOTE — Telephone Encounter (Signed)
T/C made to Rana Snare to schedule appointment for 12 month UPBEAT visit. Patient sees Dr. Rogue Bussing on 04/06/18; however this is following her plastic surgery on 04/01/18 and she does not feel she will be able to complete the physical exercise portion of the assessments. Arranged for patient to come in on 03/24/18 following her pre-op appt. And Dr. Rogue Bussing states he does want to get a CBC and METC along with her study labs that day. Patient called and informed of this and she is agreeable. Will schedule appointment for labs. [name], BSN, MHA, OCN 03/13/2018 2:47 PM

## 2018-03-18 MED FILL — CAPECITABINE 500 MG TABS: 500 | 14 days supply | Qty: 84 | Fill #0

## 2018-03-18 MED FILL — CAPECITABINE 150 MG TABS: 150 | 14 days supply | Qty: 56 | Fill #0

## 2018-03-23 ENCOUNTER — Other Ambulatory Visit

## 2018-03-24 ENCOUNTER — Encounter
Admission: RE | Admit: 2018-03-24 | Discharge: 2018-03-24 | Disposition: A | Discharge status: Home / Self Care | Source: Ambulatory Visit | Attending: Plastic Surgery | Admitting: Plastic Surgery

## 2018-03-24 ENCOUNTER — Encounter: Payer: Self-pay | Admitting: *Deleted

## 2018-03-24 ENCOUNTER — Inpatient Hospital Stay

## 2018-03-24 ENCOUNTER — Ambulatory Visit (INDEPENDENT_AMBULATORY_CARE_PROVIDER_SITE_OTHER): Payer: Self-pay | Admitting: Plastic Surgery

## 2018-03-24 ENCOUNTER — Other Ambulatory Visit: Payer: Self-pay

## 2018-03-24 ENCOUNTER — Encounter: Payer: Self-pay | Admitting: Plastic Surgery

## 2018-03-24 VITALS — BP 100/60 | HR 95 | Resp 14 | Ht 66.0 in | Wt 147.0 lb

## 2018-03-24 DIAGNOSIS — C50211 Malignant neoplasm of upper-inner quadrant of right female breast: Secondary | ICD-10-CM | POA: Diagnosis not present

## 2018-03-24 DIAGNOSIS — Z171 Estrogen receptor negative status [ER-]: Secondary | ICD-10-CM

## 2018-03-24 DIAGNOSIS — Z9889 Other specified postprocedural states: Secondary | ICD-10-CM

## 2018-03-24 DIAGNOSIS — Z1502 Genetic susceptibility to malignant neoplasm of ovary: Secondary | ICD-10-CM

## 2018-03-24 DIAGNOSIS — Z01818 Encounter for other preprocedural examination: Secondary | ICD-10-CM | POA: Insufficient documentation

## 2018-03-24 DIAGNOSIS — Z1501 Genetic susceptibility to malignant neoplasm of breast: Secondary | ICD-10-CM

## 2018-03-24 DIAGNOSIS — Z9013 Acquired absence of bilateral breasts and nipples: Secondary | ICD-10-CM

## 2018-03-24 LAB — COMPREHENSIVE METABOLIC PANEL
ALT: 16 U/L (ref 0–44)
ANION GAP: 6 (ref 5–15)
AST: 21 U/L (ref 15–41)
Albumin: 4.7 g/dL (ref 3.5–5.0)
Alkaline Phosphatase: 125 U/L (ref 38–126)
BUN: 13 mg/dL (ref 6–20)
CHLORIDE: 101 mmol/L (ref 98–111)
CO2: 28 mmol/L (ref 22–32)
Calcium: 9.7 mg/dL (ref 8.9–10.3)
Creatinine, Ser: 0.68 mg/dL (ref 0.44–1.00)
GFR calc Af Amer: >60 mL/min (ref >60)
GFR calc non Af Amer: >60 mL/min (ref >60)
GLUCOSE: 93 mg/dL (ref 70–99)
POTASSIUM: 4.2 mmol/L (ref 3.5–5.1)
Sodium: 135 mmol/L (ref 135–145)
Total Bilirubin: 1.1 mg/dL (ref 0.3–1.2)
Total Protein: 8 g/dL (ref 6.5–8.1)

## 2018-03-24 LAB — CBC WITH DIFFERENTIAL/PLATELET
Abs Immature Granulocytes: 0.02 10*3/uL (ref 0.00–0.07)
BASOS ABS: 0 10*3/uL (ref 0.0–0.1)
BASOS PCT: 1 %
EOS PCT: 6 %
Eosinophils Absolute: 0.4 10*3/uL (ref 0.0–0.5)
HCT: 35.8 % — ABNORMAL LOW (ref 36.0–46.0)
Hemoglobin: 12.3 g/dL (ref 12.0–15.0)
Immature Granulocytes: 0 %
LYMPHS PCT: 30 %
Lymphs Abs: 1.8 10*3/uL (ref 0.7–4.0)
MCH: 36.2 pg — AB (ref 26.0–34.0)
MCHC: 34.4 g/dL (ref 30.0–36.0)
MCV: 105.3 fL — ABNORMAL HIGH (ref 80.0–100.0)
MONO ABS: 0.5 10*3/uL (ref 0.1–1.0)
Monocytes Relative: 9 %
NEUTROS ABS: 3.2 10*3/uL (ref 1.7–7.7)
NRBC: 0 % (ref 0.0–0.2)
Neutrophils Relative %: 54 %
Platelets: 191 10*3/uL (ref 150–400)
RBC: 3.4 MIL/uL — AB (ref 3.87–5.11)
RDW: 15.5 % (ref 11.5–15.5)
WBC: 5.9 10*3/uL (ref 4.0–10.5)

## 2018-03-24 MED ORDER — CEPHALEXIN 500 MG PO CAPS: 500.0000 mg | ORAL_CAPSULE | Freq: Four times a day (QID) | ORAL | 0 refills | Status: AC

## 2018-03-24 MED ORDER — ONDANSETRON HCL 4 MG PO TABS: 4.0000 mg | ORAL_TABLET | Freq: Three times a day (TID) | ORAL | 0 refills | Status: AC | PRN

## 2018-03-24 MED ORDER — HYDROCODONE-ACETAMINOPHEN 5-325 MG PO TABS: 1.0000 | ORAL_TABLET | Freq: Three times a day (TID) | ORAL | 0 refills | Status: AC | PRN

## 2018-03-24 MED ORDER — DIAZEPAM 2 MG PO TABS: 2.0000 mg | ORAL_TABLET | Freq: Two times a day (BID) | ORAL | 0 refills | Status: AC | PRN

## 2018-03-24 NOTE — Patient Instructions (Signed)
Your procedure is scheduled on: April 01, 2018 [facility] Report to Day Surgery on the 2nd floor of the [facility]. To find out your arrival time, please call [phone] between 1PM - 3PM on: Tuesday March 31, 2018  REMEMBER: Instructions that are not followed completely may result in serious medical risk, up to and including death; or upon the discretion of your surgeon and anesthesiologist your surgery may need to be rescheduled.  Do not eat food after midnight the night before surgery.  No gum chewing, lozengers or hard candies.  You may however, drink CLEAR liquids up to 2 hours before you are scheduled to arrive for your surgery. Do not drink anything within 2 hours of the start of your surgery.  Clear liquids include: - water  - apple juice without pulp - CLEAR gatorade - black coffee or tea (Do NOT add milk or creamers to the coffee or tea) Do NOT drink anything that is not on this list.  Type 1 and Type 2 diabetics should only drink water.  No Alcohol for 24 hours before or after surgery.  No Smoking including e-cigarettes for 24 hours prior to surgery.  No chewable tobacco products for at least 6 hours prior to surgery.  No nicotine patches on the day of surgery.  On the morning of surgery brush your teeth with toothpaste and water, you may rinse your mouth with mouthwash if you wish. Do not swallow any toothpaste or mouthwash.  Notify your doctor if there is any change in your medical condition (cold, fever, infection).  Do not wear jewelry, make-up, hairpins, clips or nail polish.  Do not wear lotions, powders, or perfumes.   Do not shave 48 hours prior to surgery.   Contacts and dentures may not be worn into surgery.  Do not bring valuables to the hospital, including drivers license, insurance or credit cards.  [facility] is not responsible for any belongings or valuables.   TAKE THESE MEDICATIONS THE MORNING OF SURGERY: VALIUM IF NEEDED   Use CHG  Soap  as directed on instruction sheet.  Stop Anti-inflammatories (NSAIDS) such as Advil, Aleve, Ibuprofen, Motrin, Naproxen, Naprosyn and Aspirin based products such as Excedrin, Goodys Powder, BC Powder. (May take Tylenol or Acetaminophen if needed.)  Stop ANY OVER THE COUNTER supplements until after surgery. (May continue Vitamin D, Vitamin B, and multivitamin.)  Wear comfortable clothing (specific to your surgery type) to the hospital.  Plan for stool softeners for home use.  If you are being discharged the day of surgery, you will not be allowed to drive home. You will need a responsible adult to drive you home and stay with you that night.   If you are taking public transportation, you will need to have a responsible adult with you. Please confirm with your physician that it is acceptable to use public transportation.   Please call [phone] if you have any questions about these instructions.

## 2018-03-24 NOTE — Progress Notes (Signed)
Patient ID: Holly Parker, female    DOB: 1981-03-23, 37 y.o.   MRN: [id number]   Chief Complaint  Patient presents with  . Breast Problem    The patient is a 37 year old wf here for a preop history and physical for secondary breast reconstruction.  She underwent a nipple sparing mastectomy bilaterally in May 2019.  She has done very well with her expansion.  She has 430 / 350 cc bilaterally.  She is pleased with the size of the understand she will be a slight bit smaller after the exchange to implants.  She will need to have the left implant position slightly more inferior and medially.  She was BRCA1 positive, triple negative.  She has recovered from her oophorectomy and hysterectomy.  She is 5 feet 6 inches tall.  Weight is 147 pounds.   Review of Systems  Constitutional: Negative.  Negative for activity change and appetite change.  HENT: Negative.   Eyes: Negative.   Respiratory: Negative.   Cardiovascular: Negative.   Gastrointestinal: Negative.  Negative for abdominal distention.  Endocrine: Negative.   Genitourinary: Negative.   Musculoskeletal: Negative.   Skin: Negative.  Negative for color change and wound.  Hematological: Negative.   Psychiatric/Behavioral: Negative.     Past Medical History:  Diagnosis Date  . Anemia    iron deficiency during chemotherapy  . Anxiety   . Asthma    Excercise induced as a child  . Fibromyalgia   . Headache    migraine headaches due to sinus/allergies and teeth grinding at night  . Malignant neoplasm of upper-inner quadrant of right breast in female, estrogen receptor negative ([place]) 2018   right breast  . Personal history of chemotherapy    Right breast- 4 treatments    Past Surgical History:  Procedure Laterality Date  . BREAST RECONSTRUCTION WITH PLACEMENT OF TISSUE EXPANDER AND FLEX HD (ACELLULAR HYDRATED DERMIS) Bilateral 09/15/2017   Procedure: BREAST RECONSTRUCTION WITH PLACEMENT OF TISSUE EXPANDER AND FLEX HD  (ACELLULAR HYDRATED DERMIS);  Surgeon: Wallace Going, DO;  Location: [facility] OR[facility];  Service: Plastics;  Laterality: Bilateral;  . IR IMAGING GUIDED PORT INSERTION  03/17/2017  . KNEE SURGERY Left 2009   Torn meniscus   . NIPPLE SPARING MASTECTOMY/SENTINAL LYMPH NODE BIOPSY/RECONSTRUCTION/PLACEMENT OF TISSUE EXPANDER Bilateral 09/15/2017   Procedure: BILATERAL NIPPLE SPARING MASTECTOMIES  WITH RIGHT  SENTINAL LYMPH NODE BIOPSY;  Surgeon: Jovita Kussmaul, MD;  Location: [facility] OR[facility];  Service: General;  Laterality: Bilateral;  . PORT-A-CATH REMOVAL N/A 09/15/2017   Procedure: REMOVAL PORT-A-CATH;  Surgeon: Autumn Messing III, MD;  Location: [facility] ORS;  Service: General;  Laterality: N/A;  . RHINOPLASTY  [id number]   both surgeries due to broken nose      Current Outpatient Medications:  .  acetaminophen (TYLENOL) 325 MG tablet, Take 1 tablet (325 mg total) by mouth every 6 (six) hours., Disp: , Rfl:  .  capecitabine (XELODA) 150 MG tablet, TAKE 2 TABLETS (300 MG TOTAL) BY MOUTH 2 TIMES DAILY AFTER A MEAL. TAKE FOR 14 DAYS ON, THEN 7 DAYS OFF. TAKE WITH THREE 500MG TABLETS (Patient taking differently: Take 300 mg by mouth 2 (two) times daily after a meal. Take for 14 days then 7 days off), Disp: 56 tablet, Rfl: 5 .  capecitabine (XELODA) 500 MG tablet, TAKE 3 TABLETS (1,500 MG TOTAL) BY MOUTH 2 (TWO) TIMES DAILY AFTER A MEAL. TAKE FOR 14 DAYS ON, THEN 7 DAYS OFF. TAKE WITH TWO 150MG TABLETS, Disp:  84 tablet, Rfl: 5 .  cephALEXin (KEFLEX) 500 MG capsule, Take 1 capsule (500 mg total) by mouth 4 (four) times daily for 5 days., Disp: 20 capsule, Rfl: 0 .  cromolyn (NASALCROM) 5.2 MG/ACT nasal spray, Place 1 spray into both nostrils 4 (four) times daily as needed for allergies or rhinitis. (Patient not taking: Reported on 03/20/2018), Disp: 26 mL, Rfl: 0 .  diazepam (VALIUM) 2 MG tablet, Take 1 tablet (2 mg total) by mouth every 12 (twelve) hours as needed for up to 10 days for anxiety., Disp: 20 tablet, Rfl: 0 .   HYDROcodone-acetaminophen (NORCO) 5-325 MG tablet, Take 1 tablet by mouth every 8 (eight) hours as needed for up to 5 days for moderate pain., Disp: 15 tablet, Rfl: 0 .  loratadine (CLARITIN) 10 MG tablet, Take 10 mg daily by mouth., Disp: , Rfl:  .  Multiple Vitamin (MULTIVITAMIN WITH MINERALS) TABS tablet, Take 1 tablet by mouth daily. One-A-Day, Disp: , Rfl:  .  naproxen (NAPROSYN) 500 MG tablet, Take 1 tablet (500 mg total) by mouth 2 (two) times daily as needed for mild pain. (Patient not taking: Reported on 03/20/2018), Disp: , Rfl:  .  ondansetron (ZOFRAN) 4 MG tablet, Take 1 tablet (4 mg total) by mouth every 8 (eight) hours as needed for up to 5 days for nausea or vomiting., Disp: 15 tablet, Rfl: 0 .  polyethylene glycol (MIRALAX / GLYCOLAX) packet, Take 17 g by mouth daily as needed for mild constipation. (Patient not taking: Reported on 03/20/2018), Disp: 14 each, Rfl: 0 .  pseudoephedrine-acetaminophen (TYLENOL SINUS) 30-500 MG TABS tablet, Take 2 tablets by mouth daily. , Disp: , Rfl:  .  senna (SENOKOT) 8.6 MG TABS tablet, Take 1 tablet (8.6 mg total) by mouth 2 (two) times daily. (Patient not taking: Reported on 03/20/2018), Disp: 120 each, Rfl: 0 .  sertraline (ZOLOFT) 100 MG tablet, Take 1 tablet (100 mg total) by mouth at bedtime., Disp: 30 tablet, Rfl: 4 .  triamcinolone ointment (KENALOG) 0.5 %, Apply 1 application topically 2 (two) times daily. (Patient taking differently: Apply 1 application topically daily. ), Disp: 30 g, Rfl: 0 .  urea (CARMOL) 10 % cream, Apply topically as needed. (Patient taking differently: Apply 1 application topically 2 (two) times daily. ), Disp: 71 g, Rfl: 0 .  zolpidem (AMBIEN) 5 MG tablet, Take 1 tablet (5 mg total) by mouth at bedtime as needed for sleep. (Patient taking differently: Take 5 mg by mouth at bedtime. ), Disp: 30 tablet, Rfl: 0   Objective:   There were no vitals filed for this visit.  Physical Exam  Constitutional: She appears  well-developed and well-nourished.  HENT:  Head: Normocephalic and atraumatic.  Eyes: Pupils are equal, round, and reactive to light.  Cardiovascular: Normal rate.  Pulmonary/Chest: Effort normal.  Abdominal: Soft. She exhibits no distension. There is no tenderness.  Neurological: She is alert.  Skin: Skin is warm.  Psychiatric: She has a normal mood and affect. Her behavior is normal. Judgment and thought content normal.    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  Prescription prescription sent to pharmacy. The risks that can be encountered with and after placement of a breast implant  were discussed and include the following but not limited to these: bleeding, infection, delayed healing, anesthesia risks, skin sensation changes, injury to structures including nerves, blood vessels, and muscles which may be temporary or permanent, allergies to tape, suture materials and glues, blood products, topical preparations or injected agents, skin contour irregularities, skin discoloration and swelling, deep vein thrombosis, cardiac and pulmonary complications, pain, which may persist, fluid accumulation, wrinkling of the skin over the expander, changes in nipple or breast sensation, expander leakage or rupture, faulty position of the expander, persistent pain, formation of tight scar tissue around the expander (capsular contracture), possible need for revisional surgery or staged procedures.   [place], DO

## 2018-03-24 NOTE — Progress Notes (Signed)
Holly Parker presents to clinic this afternoon for her 12 month study visit for [facility] [id number] UPBEAT. Patient had [place] study labs collected per protocol as well as her height, weight, waist circumference, B/P and Pulse measured while in clinic. Weight was 146.2 lbs.in light weight clothing. Height was 67 inches without shoes. BMI per NIH calculator is 22.9. Waist circumference was 28.75 inches. Initial B/P was 97/63 in left arm and HR 80 while patient sitting straight with back supported and legs uncrossed. After 5 minutes, B/P was 110/66 and HR was 88 with similar position and using the left arm. Patient states her normal B/P runs around 100/60. She also completed her 6 minute walk and physical performance battery, her neurocognitive assessment and her 12 month self-administered questionnaire while in clinic. No cardiac MRI due at this time point Reviewed new consent form with patient including all of the changes. Holly Parker provided written consent for the [place] [facility] informed consent form protocol version date 12/23/17. She elected not to participate in the option to receive pre-composed motivational test messages from the study. Patient was given copies of the signed informed consent form for her records. She was also given a $25 [employer] gift card for which she signed as having received. Patient informed that next study-related visit will occur in about a year, but I will touch base with her in the meantime. Holly Parker, BSN, MHA, OCN 03/24/2018 3:59 PM

## 2018-03-24 NOTE — H&P (View-Only) (Signed)
Patient ID: Holly Parker, female    DOB: 1980/07/22, 37 y.o.   MRN: [id number]   Chief Complaint  Patient presents with  . Breast Problem    The patient is a 37 year old wf here for a preop history and physical for secondary breast reconstruction.  She underwent a nipple sparing mastectomy bilaterally in May 2019.  She has done very well with her expansion.  She has 430 / 350 cc bilaterally.  She is pleased with the size of the understand she will be a slight bit smaller after the exchange to implants.  She will need to have the left implant position slightly more inferior and medially.  She was BRCA1 positive, triple negative.  She has recovered from her oophorectomy and hysterectomy.  She is 5 feet 6 inches tall.  Weight is 147 pounds.   Review of Systems  Constitutional: Negative.  Negative for activity change and appetite change.  HENT: Negative.   Eyes: Negative.   Respiratory: Negative.   Cardiovascular: Negative.   Gastrointestinal: Negative.  Negative for abdominal distention.  Endocrine: Negative.   Genitourinary: Negative.   Musculoskeletal: Negative.   Skin: Negative.  Negative for color change and wound.  Hematological: Negative.   Psychiatric/Behavioral: Negative.     Past Medical History:  Diagnosis Date  . Anemia    iron deficiency during chemotherapy  . Anxiety   . Asthma    Excercise induced as a child  . Fibromyalgia   . Headache    migraine headaches due to sinus/allergies and teeth grinding at night  . Malignant neoplasm of upper-inner quadrant of right breast in female, estrogen receptor negative ([place]) 2018   right breast  . Personal history of chemotherapy    Right breast- 4 treatments    Past Surgical History:  Procedure Laterality Date  . BREAST RECONSTRUCTION WITH PLACEMENT OF TISSUE EXPANDER AND FLEX HD (ACELLULAR HYDRATED DERMIS) Bilateral 09/15/2017   Procedure: BREAST RECONSTRUCTION WITH PLACEMENT OF TISSUE EXPANDER AND FLEX HD  (ACELLULAR HYDRATED DERMIS);  Surgeon: Wallace Going, DO;  Location: [facility];  Service: Plastics;  Laterality: Bilateral;  . IR IMAGING GUIDED PORT INSERTION  03/17/2017  . KNEE SURGERY Left 2009   Torn meniscus   . NIPPLE SPARING MASTECTOMY/SENTINAL LYMPH NODE BIOPSY/RECONSTRUCTION/PLACEMENT OF TISSUE EXPANDER Bilateral 09/15/2017   Procedure: BILATERAL NIPPLE SPARING MASTECTOMIES  WITH RIGHT  SENTINAL LYMPH NODE BIOPSY;  Surgeon: Jovita Kussmaul, MD;  Location: [facility];  Service: General;  Laterality: Bilateral;  . PORT-A-CATH REMOVAL N/A 09/15/2017   Procedure: REMOVAL PORT-A-CATH;  Surgeon: Autumn Messing III, MD;  Location: [facility];  Service: General;  Laterality: N/A;  . RHINOPLASTY  [id number]   both surgeries due to broken nose      Current Outpatient Medications:  .  acetaminophen (TYLENOL) 325 MG tablet, Take 1 tablet (325 mg total) by mouth every 6 (six) hours., Disp: , Rfl:  .  capecitabine (XELODA) 150 MG tablet, TAKE 2 TABLETS (300 MG TOTAL) BY MOUTH 2 TIMES DAILY AFTER A MEAL. TAKE FOR 14 DAYS ON, THEN 7 DAYS OFF. TAKE WITH THREE 500MG TABLETS (Patient taking differently: Take 300 mg by mouth 2 (two) times daily after a meal. Take for 14 days then 7 days off), Disp: 56 tablet, Rfl: 5 .  capecitabine (XELODA) 500 MG tablet, TAKE 3 TABLETS (1,500 MG TOTAL) BY MOUTH 2 (TWO) TIMES DAILY AFTER A MEAL. TAKE FOR 14 DAYS ON, THEN 7 DAYS OFF. TAKE WITH TWO 150MG TABLETS, Disp:  84 tablet, Rfl: 5 .  cephALEXin (KEFLEX) 500 MG capsule, Take 1 capsule (500 mg total) by mouth 4 (four) times daily for 5 days., Disp: 20 capsule, Rfl: 0 .  cromolyn (NASALCROM) 5.2 MG/ACT nasal spray, Place 1 spray into both nostrils 4 (four) times daily as needed for allergies or rhinitis. (Patient not taking: Reported on 03/20/2018), Disp: 26 mL, Rfl: 0 .  diazepam (VALIUM) 2 MG tablet, Take 1 tablet (2 mg total) by mouth every 12 (twelve) hours as needed for up to 10 days for anxiety., Disp: 20 tablet, Rfl: 0 .   HYDROcodone-acetaminophen (NORCO) 5-325 MG tablet, Take 1 tablet by mouth every 8 (eight) hours as needed for up to 5 days for moderate pain., Disp: 15 tablet, Rfl: 0 .  loratadine (CLARITIN) 10 MG tablet, Take 10 mg daily by mouth., Disp: , Rfl:  .  Multiple Vitamin (MULTIVITAMIN WITH MINERALS) TABS tablet, Take 1 tablet by mouth daily. One-A-Day, Disp: , Rfl:  .  naproxen (NAPROSYN) 500 MG tablet, Take 1 tablet (500 mg total) by mouth 2 (two) times daily as needed for mild pain. (Patient not taking: Reported on 03/20/2018), Disp: , Rfl:  .  ondansetron (ZOFRAN) 4 MG tablet, Take 1 tablet (4 mg total) by mouth every 8 (eight) hours as needed for up to 5 days for nausea or vomiting., Disp: 15 tablet, Rfl: 0 .  polyethylene glycol (MIRALAX / GLYCOLAX) packet, Take 17 g by mouth daily as needed for mild constipation. (Patient not taking: Reported on 03/20/2018), Disp: 14 each, Rfl: 0 .  pseudoephedrine-acetaminophen (TYLENOL SINUS) 30-500 MG TABS tablet, Take 2 tablets by mouth daily. , Disp: , Rfl:  .  senna (SENOKOT) 8.6 MG TABS tablet, Take 1 tablet (8.6 mg total) by mouth 2 (two) times daily. (Patient not taking: Reported on 03/20/2018), Disp: 120 each, Rfl: 0 .  sertraline (ZOLOFT) 100 MG tablet, Take 1 tablet (100 mg total) by mouth at bedtime., Disp: 30 tablet, Rfl: 4 .  triamcinolone ointment (KENALOG) 0.5 %, Apply 1 application topically 2 (two) times daily. (Patient taking differently: Apply 1 application topically daily. ), Disp: 30 g, Rfl: 0 .  urea (CARMOL) 10 % cream, Apply topically as needed. (Patient taking differently: Apply 1 application topically 2 (two) times daily. ), Disp: 71 g, Rfl: 0 .  zolpidem (AMBIEN) 5 MG tablet, Take 1 tablet (5 mg total) by mouth at bedtime as needed for sleep. (Patient taking differently: Take 5 mg by mouth at bedtime. ), Disp: 30 tablet, Rfl: 0   Objective:   There were no vitals filed for this visit.  Physical Exam  Constitutional: She appears  well-developed and well-nourished.  HENT:  Head: Normocephalic and atraumatic.  Eyes: Pupils are equal, round, and reactive to light.  Cardiovascular: Normal rate.  Pulmonary/Chest: Effort normal.  Abdominal: Soft. She exhibits no distension. There is no tenderness.  Neurological: She is alert.  Skin: Skin is warm.  Psychiatric: She has a normal mood and affect. Her behavior is normal. Judgment and thought content normal.    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  Prescription prescription sent to pharmacy. The risks that can be encountered with and after placement of a breast implant  were discussed and include the following but not limited to these: bleeding, infection, delayed healing, anesthesia risks, skin sensation changes, injury to structures including nerves, blood vessels, and muscles which may be temporary or permanent, allergies to tape, suture materials and glues, blood products, topical preparations or injected agents, skin contour irregularities, skin discoloration and swelling, deep vein thrombosis, cardiac and pulmonary complications, pain, which may persist, fluid accumulation, wrinkling of the skin over the expander, changes in nipple or breast sensation, expander leakage or rupture, faulty position of the expander, persistent pain, formation of tight scar tissue around the expander (capsular contracture), possible need for revisional surgery or staged procedures.   [place], DO

## 2018-04-01 ENCOUNTER — Encounter
Admission: RE | Disposition: A | Discharge status: Home / Self Care | Payer: Self-pay | Source: Ambulatory Visit | Attending: Plastic Surgery

## 2018-04-01 ENCOUNTER — Encounter

## 2018-04-01 ENCOUNTER — Other Ambulatory Visit: Payer: Self-pay

## 2018-04-01 ENCOUNTER — Encounter: Payer: Self-pay | Admitting: *Deleted

## 2018-04-01 ENCOUNTER — Ambulatory Visit
Admission: RE | Admit: 2018-04-01 | Discharge: 2018-04-01 | Disposition: A | Discharge status: Home / Self Care | Source: Ambulatory Visit | Attending: Plastic Surgery | Admitting: Plastic Surgery

## 2018-04-01 ENCOUNTER — Ambulatory Visit

## 2018-04-01 DIAGNOSIS — Z79899 Other long term (current) drug therapy: Secondary | ICD-10-CM | POA: Diagnosis not present

## 2018-04-01 DIAGNOSIS — Z1501 Genetic susceptibility to malignant neoplasm of breast: Secondary | ICD-10-CM | POA: Diagnosis not present

## 2018-04-01 DIAGNOSIS — Z87891 Personal history of nicotine dependence: Secondary | ICD-10-CM | POA: Diagnosis not present

## 2018-04-01 DIAGNOSIS — Z90710 Acquired absence of both cervix and uterus: Secondary | ICD-10-CM | POA: Insufficient documentation

## 2018-04-01 DIAGNOSIS — Z9079 Acquired absence of other genital organ(s): Secondary | ICD-10-CM | POA: Diagnosis not present

## 2018-04-01 DIAGNOSIS — Z421 Encounter for breast reconstruction following mastectomy: Secondary | ICD-10-CM | POA: Insufficient documentation

## 2018-04-01 DIAGNOSIS — C50211 Malignant neoplasm of upper-inner quadrant of right female breast: Secondary | ICD-10-CM | POA: Diagnosis not present

## 2018-04-01 DIAGNOSIS — Z853 Personal history of malignant neoplasm of breast: Secondary | ICD-10-CM

## 2018-04-01 DIAGNOSIS — Z9013 Acquired absence of bilateral breasts and nipples: Secondary | ICD-10-CM | POA: Insufficient documentation

## 2018-04-01 DIAGNOSIS — Z9221 Personal history of antineoplastic chemotherapy: Secondary | ICD-10-CM | POA: Insufficient documentation

## 2018-04-01 DIAGNOSIS — Z171 Estrogen receptor negative status [ER-]: Secondary | ICD-10-CM | POA: Diagnosis not present

## 2018-04-01 HISTORY — PX: REMOVAL OF BILATERAL TISSUE EXPANDERS WITH PLACEMENT OF BILATERAL BREAST IMPLANTS: SHX6431

## 2018-04-01 SURGERY — REMOVAL, TISSUE EXPANDER, BREAST, BILATERAL, WITH BILATERAL IMPLANT IMPLANT INSERTION
Anesthesia: General | Laterality: Bilateral

## 2018-04-01 MED ORDER — LACTATED RINGERS IV SOLN
INTRAVENOUS | Status: DC
  Administered 2018-04-01: 10:00:00 via INTRAVENOUS

## 2018-04-01 MED ORDER — FENTANYL CITRATE (PF) 100 MCG/2ML IJ SOLN
INTRAMUSCULAR | Status: DC | PRN
  Administered 2018-04-01: 25 ug via INTRAVENOUS
  Administered 2018-04-01 (×2): 50 ug via INTRAVENOUS
  Administered 2018-04-01: 25 ug via INTRAVENOUS
  Administered 2018-04-01: 50 ug via INTRAVENOUS

## 2018-04-01 MED ORDER — LIDOCAINE HCL (CARDIAC) PF 100 MG/5ML IV SOSY
PREFILLED_SYRINGE | INTRAVENOUS | Status: DC | PRN
  Administered 2018-04-01: 80 mg via INTRAVENOUS

## 2018-04-01 MED ORDER — ONDANSETRON HCL 4 MG/2ML IJ SOLN
INTRAMUSCULAR | Status: AC
  Filled 2018-04-01: qty 2

## 2018-04-01 MED ORDER — MIDAZOLAM HCL 2 MG/2ML IJ SOLN
INTRAMUSCULAR | Status: AC
  Filled 2018-04-01: qty 2

## 2018-04-01 MED ORDER — CEFAZOLIN SODIUM-DEXTROSE 2-4 GM/100ML-% IV SOLN
INTRAVENOUS | Status: AC
  Filled 2018-04-01: qty 100

## 2018-04-01 MED ORDER — ACETAMINOPHEN 10 MG/ML IV SOLN
INTRAVENOUS | Status: DC | PRN
  Administered 2018-04-01: 1000 mg via INTRAVENOUS

## 2018-04-01 MED ORDER — FAMOTIDINE 20 MG PO TABS
20.0000 mg | ORAL_TABLET | Freq: Once | ORAL | Status: AC
  Administered 2018-04-01: 20 mg via ORAL

## 2018-04-01 MED ORDER — DEXAMETHASONE SODIUM PHOSPHATE 10 MG/ML IJ SOLN
INTRAMUSCULAR | Status: DC | PRN
  Administered 2018-04-01: 10 mg via INTRAVENOUS

## 2018-04-01 MED ORDER — PROPOFOL 10 MG/ML IV BOLUS
INTRAVENOUS | Status: AC
  Filled 2018-04-01: qty 20

## 2018-04-01 MED ORDER — FENTANYL CITRATE (PF) 100 MCG/2ML IJ SOLN
INTRAMUSCULAR | Status: AC
  Filled 2018-04-01: qty 2

## 2018-04-01 MED ORDER — FAMOTIDINE 20 MG PO TABS
ORAL_TABLET | ORAL | Status: AC
  Filled 2018-04-01: qty 1

## 2018-04-01 MED ORDER — BACITRACIN 50000 UNITS IM SOLR
INTRAMUSCULAR | Status: AC
  Filled 2018-04-01: qty 1

## 2018-04-01 MED ORDER — ACETAMINOPHEN 10 MG/ML IV SOLN
INTRAVENOUS | Status: AC
  Filled 2018-04-01: qty 100

## 2018-04-01 MED ORDER — MEPERIDINE HCL 50 MG/ML IJ SOLN: 6.2500 mg | INTRAMUSCULAR | Status: DC | PRN

## 2018-04-01 MED ORDER — CEFAZOLIN SODIUM-DEXTROSE 2-4 GM/100ML-% IV SOLN
2.0000 g | INTRAVENOUS | Status: AC
  Administered 2018-04-01: 2 g via INTRAVENOUS

## 2018-04-01 MED ORDER — FENTANYL CITRATE (PF) 100 MCG/2ML IJ SOLN
25.0000 ug | INTRAMUSCULAR | Status: DC | PRN
  Administered 2018-04-01: 50 ug via INTRAVENOUS
  Administered 2018-04-01 (×2): 25 ug via INTRAVENOUS

## 2018-04-01 MED ORDER — OXYCODONE HCL 5 MG PO TABS: 5.0000 mg | ORAL_TABLET | Freq: Once | ORAL | Status: DC | PRN

## 2018-04-01 MED ORDER — ROCURONIUM BROMIDE 50 MG/5ML IV SOLN
INTRAVENOUS | Status: AC
  Filled 2018-04-01: qty 1

## 2018-04-01 MED ORDER — BUPIVACAINE-EPINEPHRINE 0.25% -1:200000 IJ SOLN
INTRAMUSCULAR | Status: DC | PRN
  Administered 2018-04-01: 10 mL

## 2018-04-01 MED ORDER — OXYCODONE HCL 5 MG/5ML PO SOLN: 5.0000 mg | Freq: Once | ORAL | Status: DC | PRN

## 2018-04-01 MED ORDER — BUPIVACAINE-EPINEPHRINE (PF) 0.25% -1:200000 IJ SOLN
INTRAMUSCULAR | Status: AC
  Filled 2018-04-01: qty 30

## 2018-04-01 MED ORDER — DEXAMETHASONE SODIUM PHOSPHATE 10 MG/ML IJ SOLN
INTRAMUSCULAR | Status: AC
  Filled 2018-04-01: qty 1

## 2018-04-01 MED ORDER — FENTANYL CITRATE (PF) 100 MCG/2ML IJ SOLN
INTRAMUSCULAR | Status: AC
  Administered 2018-04-01: 25 ug via INTRAVENOUS
  Filled 2018-04-01: qty 2

## 2018-04-01 MED ORDER — EPHEDRINE SULFATE 50 MG/ML IJ SOLN
INTRAMUSCULAR | Status: DC | PRN
  Administered 2018-04-01: 10 mg via INTRAVENOUS

## 2018-04-01 MED ORDER — SODIUM CHLORIDE 0.9 % IV SOLN
INTRAVENOUS | Status: DC | PRN
  Administered 2018-04-01: 1000 mL

## 2018-04-01 MED ORDER — PROPOFOL 10 MG/ML IV BOLUS
INTRAVENOUS | Status: DC | PRN
  Administered 2018-04-01: 200 mg via INTRAVENOUS

## 2018-04-01 MED ORDER — ONDANSETRON HCL 4 MG/2ML IJ SOLN
INTRAMUSCULAR | Status: DC | PRN
  Administered 2018-04-01: 4 mg via INTRAVENOUS

## 2018-04-01 MED ORDER — PROMETHAZINE HCL 25 MG/ML IJ SOLN: 6.2500 mg | INTRAMUSCULAR | Status: DC | PRN

## 2018-04-01 MED ORDER — EPHEDRINE SULFATE 50 MG/ML IJ SOLN
INTRAMUSCULAR | Status: AC
  Filled 2018-04-01: qty 1

## 2018-04-01 MED ORDER — PHENYLEPHRINE HCL 10 MG/ML IJ SOLN
INTRAMUSCULAR | Status: DC | PRN
  Administered 2018-04-01 (×6): 50 ug via INTRAVENOUS

## 2018-04-01 MED ORDER — MIDAZOLAM HCL 2 MG/2ML IJ SOLN
INTRAMUSCULAR | Status: DC | PRN
  Administered 2018-04-01: 2 mg via INTRAVENOUS

## 2018-04-01 MED ORDER — LIDOCAINE HCL (PF) 2 % IJ SOLN
INTRAMUSCULAR | Status: AC
  Filled 2018-04-01: qty 10

## 2018-04-01 SURGICAL SUPPLY — 54 items
ADH SKN CLS APL DERMABOND .7 (GAUZE/BANDAGES/DRESSINGS) ×2
BAG DECANTER FOR FLEXI CONT (MISCELLANEOUS) ×3 IMPLANT
BINDER BREAST LRG (GAUZE/BANDAGES/DRESSINGS) ×4 IMPLANT
BINDER BREAST XLRG (GAUZE/BANDAGES/DRESSINGS) IMPLANT
BIOPATCH RED 1 DISK 7.0 (GAUZE/BANDAGES/DRESSINGS) ×1 IMPLANT
BIOPATCH RED 1IN DISK 7.0MM (GAUZE/BANDAGES/DRESSINGS)
BLADE BOVIE TIP EXT 4 (BLADE) ×4 IMPLANT
BLADE SURG 10 STRL SS SAFETY (BLADE) ×1 IMPLANT
CANISTER SUCT 3000ML PPV (MISCELLANEOUS) ×3 IMPLANT
CHLORAPREP W/TINT 26ML (MISCELLANEOUS) ×5 IMPLANT
COVER WAND RF STERILE (DRAPES) ×1 IMPLANT
DERMABOND ADVANCED (GAUZE/BANDAGES/DRESSINGS) ×4
DERMABOND ADVANCED .7 DNX12 (GAUZE/BANDAGES/DRESSINGS) ×1 IMPLANT
DRAIN CHANNEL 19F RND (DRAIN) ×3 IMPLANT
DRAPE CHEST BREAST 15X10 FENES (DRAPES) ×4 IMPLANT
DRAPE HALF SHEET 40X57 (DRAPES) ×2 IMPLANT
DRAPE SURG 17X23 STRL (DRAPES) ×4 IMPLANT
ELECT REM PT RETURN 9FT ADLT (ELECTROSURGICAL)
ELECTRODE REM PT RTRN 9FT ADLT (ELECTROSURGICAL) ×1 IMPLANT
EVACUATOR SILICONE 100CC (DRAIN) ×1 IMPLANT
GAUZE SPONGE 4X4 12PLY STRL (GAUZE/BANDAGES/DRESSINGS) ×7 IMPLANT
GLOVE BIO SURGEON STRL SZ 6.5 (GLOVE) ×7 IMPLANT
GLOVE BIO SURGEONS STRL SZ 6.5 (GLOVE) ×6
GOWN STRL REUS W/ TWL LRG LVL3 (GOWN DISPOSABLE) ×2 IMPLANT
GOWN STRL REUS W/TWL LRG LVL3 (GOWN DISPOSABLE) ×6
IMPL BREAST SMOOTH UH 455CC (Breast) IMPLANT
IMPLANT BREAST SMOOTH UH 455CC (Breast) ×6 IMPLANT
KIT BASIN OR (CUSTOM PROCEDURE TRAY) ×1 IMPLANT
KIT TURNOVER KIT A (KITS) ×3 IMPLANT
NEEDLE HYPO 22GX1.5 SAFETY (NEEDLE) ×2 IMPLANT
NS IRRIG 1000ML POUR BTL (IV SOLUTION) ×4 IMPLANT
PACK BASIN MAJOR ARMC (MISCELLANEOUS) ×3 IMPLANT
PAD ABD DERMACEA PRESS 5X9 (GAUZE/BANDAGES/DRESSINGS) ×2 IMPLANT
PIN SAFETY STRL (MISCELLANEOUS) ×3 IMPLANT
SET ASEPTIC TRANSFER (MISCELLANEOUS) IMPLANT
SIZER BREAST REUSE 455CC (SIZER) ×3
SIZER BRST REUSE 455CC (SIZER) IMPLANT
STAPLER SKIN PROX 35W (STAPLE) IMPLANT
SUT MNCRL 3-0 UNDYED SH (SUTURE) IMPLANT
SUT MNCRL 4-0 (SUTURE) ×6
SUT MNCRL 4-0 27XMFL (SUTURE) ×2
SUT MNCRL 5-0 UNDYED TF 13 (SUTURE) ×2 IMPLANT
SUT MONOCRYL 3-0 UNDYED (SUTURE) ×4
SUT MONOCRYL 5-0 (SUTURE) ×4
SUT PDS AB 2-0 CT1 27 (SUTURE) IMPLANT
SUT SILK 3 0 SH 30 (SUTURE) ×1 IMPLANT
SUT VIC AB 3-0 SH 27 (SUTURE)
SUT VIC AB 3-0 SH 27X BRD (SUTURE) ×3 IMPLANT
SUT VIC AB 4-0 PS2 18 (SUTURE) ×2 IMPLANT
SUTURE MNCRL 4-0 27XMF (SUTURE) IMPLANT
SYR 10ML LL (SYRINGE) ×2 IMPLANT
SYR BULB IRRIG 60ML STRL (SYRINGE) ×2 IMPLANT
TOWEL OR 17X26 4PK STRL BLUE (TOWEL DISPOSABLE) ×5 IMPLANT
TRAY FOLEY MTR SLVR 16FR STAT (SET/KITS/TRAYS/PACK) IMPLANT

## 2018-04-01 NOTE — Discharge Instructions (Addendum)
INSTRUCTIONS FOR AFTER BREAST SURGERY ° ° °You are getting ready to undergo breast surgery.  You will likely have some questions about what to expect following your operation.  The following information will help you and your family understand what to expect when you are discharged from the hospital.  Following these guidelines will help ensure a smooth recovery and reduce risks of complications.   °Postoperative instructions include information on: diet, wound care, medications and physical activity. ° °AFTER SURGERY °Expect to go home after the procedure.  In some cases, you may need to spend one night in the hospital for observation. ° °DIET °Breast surgery does not require a specific diet.  However, I have to mention that the healthier you eat the better your body can start healing. It is important to increasing your protein intake.  This means limiting the foods with sugar and carbohydrates.  Focus on vegetables and some meat.  If you have any liposuction during your procedure be sure to drink water.  If your urine is bright yellow, then it is concentrated, and you need to drink more water.  As a general rule after surgery, you should have 8 ounces of water every hour while awake.  If you find you are persistently nauseated or unable to take in liquids let us know.  NO TOBACCO USE or EXPOSURE.  This will slow your healing process and increase the risk of a wound. ° °WOUND CARE °You can shower the day after surgery if you don't have a drain.  Use fragrance free soap.  Dial, Dove and Ivory are usually mild on the skin. If you have a drain clean with baby wipes until the drain is removed.  If you have steri-strips / tape directly attached to your skin leave them in place. It is OK to get these wet.  No baths, pools or hot tubs for two weeks. °We close your incision to leave the smallest and best-looking scar. No ointment or creams on your incisions until given the go ahead.  Especially not Neosporin (Too many skin  reactions with this one).  A few weeks after surgery you can use Mederma and start massaging the scar. °We ask you to wear your binder or sports bra for the first 6 weeks around the clock, including while sleeping. This provides added comfort and helps reduce the fluid accumulation at the surgery site. ° °ACTIVITY °No heavy lifting until cleared by the doctor.  This usually means no more than a half-gallon of milk.  It is OK to walk and climb stairs. In fact, moving your legs is very important to decrease your risk of a blood clot.  It will also help keep you from getting deconditioned.  Every 1 to 2 hours get up and walk for 5 minutes. This will help with a quicker recovery back to normal.  Let pain be your guide so you don't do too much.  NO, you cannot do the spring cleaning and don't plan on taking care of anyone else.  This is your time for TLC.  °You will be more comfortable if you sleep and rest with your head elevated either with a few pillows under you or in a recliner.  No stomach sleeping for a few months. ° °WORK °Everyone returns to work at different times. As a rough guide, most people take at least 1 - 2 weeks off prior to returning to work. If you need documentation for your job, bring the forms to your postoperative follow   up visit.  DRIVING Arrange for someone to bring you home from the hospital.  You may be able to drive a few days after surgery but not while taking any narcotics or valium.  BOWEL MOVEMENTS Constipation can occur after anesthesia and while taking pain medication.  It is important to stay ahead for your comfort.  We recommend taking Milk of Magnesia (2 tablespoons; twice a day) while taking the pain pills.  SEROMA This is fluid your body tried to put in the surgical site.  This is normal but if it creates tight skinny skin let us know.  It usually decreases in a few weeks.  WHEN TO CALL Call your surgeon's office if any of the following occur:  Fever 101 degrees F or  greater  Excessive bleeding or fluid from the incision site.  Pain that increases over time without aid from the medications  Redness, warmth, or pus draining from incision sites  Persistent nausea or inability to take in liquids  Severe misshapen area that underwent the operation.  Here are some resources:  1. Plastic surgery website: https://www.plasticsurgery.org/for-medical-professionals/education-and-resources/publications/breast-reconstruction-magazine 2. Breast Reconstruction Awareness Campaign:  [employer] Plastic surgery Implant information:  https://www.plasticsurgery.org/patient-safety/breast-implant-safety    AMBULATORY SURGERY  DISCHARGE INSTRUCTIONS   1) The drugs that you were given will stay in your system until tomorrow so for the next 24 hours you should not:  A) Drive an automobile B) Make any legal decisions C) Drink any alcoholic beverage   2) You may resume regular meals tomorrow.  Today it is better to start with liquids and gradually work up to solid foods.  You may eat anything you prefer, but it is better to start with liquids, then soup and crackers, and gradually work up to solid foods.   3) Please notify your doctor immediately if you have any unusual bleeding, trouble breathing, redness and pain at the surgery site, drainage, fever, or pain not relieved by medication.    4) Additional Instructions:        Please contact your physician with any problems or Same Day Surgery at [phone], Monday through Friday 6 am to 4 pm, or [place] at [facility] number at [phone].

## 2018-04-01 NOTE — Op Note (Signed)
Op report Bilateral Exchange   DATE OF OPERATION: 04/01/2018  LOCATION: [facility]  SURGICAL DIVISION: Plastic Surgery  PREOPERATIVE DIAGNOSES:  1.History of breast cancer.  2. Acquired absence of bilateral breast.   POSTOPERATIVE DIAGNOSES:  1. History of breast cancer.  2. Acquired absence of bilateral breast.   PROCEDURE:  1. Bilateral exchange of tissue expanders for implants.  2. Bilateral capsulotomies for implant respositioning.  SURGEON: Rodrickus Min Sanger Kalandra Masters, DO  ANESTHESIA:  General.   COMPLICATIONS: None.   IMPLANTS: Left - Mentor Smooth Round Ultra High Profile Gel 455 cc. Ref #[id number].  Serial Number [id number] Right - Mentor Smooth Round Ultra High Profile Gel 455 cc. Ref #[id number].  Serial Number [id number]  INDICATIONS FOR PROCEDURE:  The patient, Holly Parker, is a 37 y.o. female born on Feb 28, 1981, is here for treatment after bilateral mastectomies.  She had tissue expanders placed at the time of mastectomies. She now presents for exchange of her expanders for implants.  She requires capsulotomies to better position the implants. MRN: [id number]  CONSENT:  Informed consent was obtained directly from the patient. Risks, benefits and alternatives were fully discussed. Specific risks including but not limited to bleeding, infection, hematoma, seroma, scarring, pain, implant infection, implant extrusion, capsular contracture, asymmetry, wound healing problems, and need for further surgery were all discussed. The patient did have an ample opportunity to have her questions answered to her satisfaction.   DESCRIPTION OF PROCEDURE:  The patient was taken to the operating room. SCDs were placed and IV antibiotics were given. The patient's chest was prepped and draped in a sterile fashion. A time out was performed and the implants to be used were identified.    On the right breast: One percent Lidocaine with epinephrine was used to infiltrate at  the incision site. The old mastectomy scar was excised.  The mastectomy flaps from the superior and inferior flaps were raised over the pectoralis major muscle or capsule as present for several centimeters to minimize tension for the closure. The capsule was split inferior to the skin incision to expose and remove the tissue expander.  Inspection of the pocket showed a normal healthy capsule and good integration of the biologic matrix.  The pocket was irrigated with antibiotic solution.  Circumferential capsulotomies were performed to allow for breast pocket expansion.  Measurements were made and a sizer used to confirm adequate pocket size for the implant dimensions.  Hemostasis was ensured with electrocautery. New gloves were placed. The implant was soaked in antibiotic solution and then placed in the pocket and oriented appropriately. The capsule on the anterior surface was re-closed with a 3-0 Monocryl suture. The remaining skin was closed with 4-0 Monocryl deep dermal and 5-0 Monocryl subcuticular stitches.   On the left breast: The old mastectomy scar was incised.  The mastectomy flaps from the superior and inferior flaps were raised over the pectoralis major muscle or capsule as present for several centimeters to minimize tension for the closure. The capsule was split inferior to the skin incision to expose and remove the tissue expander.  Inspection of the pocket showed a normal healthy capsule and good integration of the biologic matrix.   Circumferential capsulotomies were performed to allow for breast pocket expansion.  Measurements were made and a sizer utilized to confirm adequate pocket size for the implant dimensions.  Hemostasis was ensured with the electrocautery.  New gloves were applied. The implant was soaked in antibiotic solution and placed in the pocket and oriented appropriately. The  capsule on the anterior surface was re-closed with a 3-0 Monocryl suture. The remaining skin was closed  with 4-0 Monocryl deep dermal and 5-0 Monocryl subcuticular stitches.  Dermabond was applied to the incision site. A breast binder and ABDs were placed.  The patient was allowed to wake from anesthesia and taken to the recovery room in satisfactory condition.

## 2018-04-01 NOTE — Transfer of Care (Signed)
Immediate Anesthesia Transfer of Care Note  Patient: Holly Parker  Procedure(s) Performed: REMOVAL OF BILATERAL TISSUE EXPANDERS WITH PLACEMENT OF BILATERAL BREAST IMPLANTS (Bilateral )  Patient Location: PACU  Anesthesia Type:General  Level of Consciousness: drowsy  Airway & Oxygen Therapy: Patient Spontanous Breathing and Patient connected to face mask oxygen  Post-op Assessment: Report given to RN and Post -op Vital signs reviewed and stable  Post vital signs: Reviewed and stable  Last Vitals:  Vitals Value Taken Time  BP 98/58 04/01/2018 12:47 PM  Temp 36.4 C 04/01/2018 12:47 PM  Pulse 87 04/01/2018 12:52 PM  Resp 14 04/01/2018 12:52 PM  SpO2 100 % 04/01/2018 12:52 PM  Vitals shown include unvalidated device data.  Last Pain:  Vitals:   04/01/18 1247  TempSrc:   PainSc: Asleep         Complications: No apparent anesthesia complications

## 2018-04-01 NOTE — Anesthesia Post-op Follow-up Note (Signed)
Anesthesia QCDR form completed.        

## 2018-04-01 NOTE — Interval H&P Note (Signed)
History and Physical Interval Note:  04/01/2018 10:31 AM  Holly Parker  has presented today for surgery, with the diagnosis of n/a  The various methods of treatment have been discussed with the patient and family. After consideration of risks, benefits and other options for treatment, the patient has consented to  Procedure(s): REMOVAL OF BILATERAL TISSUE EXPANDERS WITH PLACEMENT OF BILATERAL BREAST IMPLANTS (Bilateral) as a surgical intervention .  The patient's history has been reviewed, patient examined, no change in status, stable for surgery.  I have reviewed the patient's chart and labs.  Questions were answered to the patient's satisfaction.     Holly Parker

## 2018-04-01 NOTE — Anesthesia Preprocedure Evaluation (Signed)
Anesthesia Evaluation  Patient identified by MRN, date of birth, ID band Patient awake    Reviewed: Allergy & Precautions, NPO status , Patient's Chart, lab work & pertinent test results  History of Anesthesia Complications Negative for: history of anesthetic complications  Airway Mallampati: II  TM Distance: >3 FB Neck ROM: Full    Dental no notable dental hx.    Pulmonary asthma , former smoker,    breath sounds clear to auscultation- rhonchi (-) wheezing      Cardiovascular Exercise Tolerance: Good (-) hypertension(-) CAD, (-) Past MI, (-) Cardiac Stents and (-) CABG  Rhythm:Regular Rate:Normal - Systolic murmurs and - Diastolic murmurs    Neuro/Psych  Headaches, Anxiety    GI/Hepatic negative GI ROS, Neg liver ROS,   Endo/Other  negative endocrine ROSneg diabetes  Renal/GU negative Renal ROS     Musculoskeletal  (+) Fibromyalgia -  Abdominal (+) - obese,   Peds  Hematology  (+) anemia ,   Anesthesia Other Findings Past Medical History: No date: Anemia     Comment:  iron deficiency during chemotherapy No date: Anxiety No date: Asthma     Comment:  Excercise induced as a child No date: Fibromyalgia No date: Headache     Comment:  migraine headaches due to sinus/allergies and teeth               grinding at night 2018: Malignant neoplasm of upper-inner quadrant of right breast in  female, estrogen receptor negative ([place])     Comment:  right breast No date: Personal history of chemotherapy     Comment:  Right breast- 4 treatments   Reproductive/Obstetrics                            Anesthesia Physical Anesthesia Plan  ASA: II  Anesthesia Plan: General   Post-op Pain Management:    Induction: Intravenous  PONV Risk Score and Plan: 2  Airway Management Planned: LMA  Additional Equipment:   Intra-op Plan:   Post-operative Plan:   Informed Consent: I have reviewed the  patients History and Physical, chart, labs and discussed the procedure including the risks, benefits and alternatives for the proposed anesthesia with the patient or authorized representative who has indicated his/her understanding and acceptance.   Dental advisory given  Plan Discussed with: CRNA and Anesthesiologist  Anesthesia Plan Comments:         Anesthesia Quick Evaluation

## 2018-04-01 NOTE — Anesthesia Procedure Notes (Signed)
Procedure Name: LMA Insertion Date/Time: 04/01/2018 11:00 AM Performed by: Jacqualine Mau, RN Pre-anesthesia Checklist: Patient identified, Emergency Drugs available, Suction available, Patient being monitored and Timeout performed Patient Re-evaluated:Patient Re-evaluated prior to induction Oxygen Delivery Method: Circle system utilized Preoxygenation: Pre-oxygenation with 100% oxygen Induction Type: IV induction Ventilation: Mask ventilation without difficulty LMA: LMA inserted LMA Size: 3.5 Number of attempts: 1

## 2018-04-02 ENCOUNTER — Encounter: Payer: Self-pay | Admitting: Plastic Surgery

## 2018-04-02 NOTE — Anesthesia Postprocedure Evaluation (Signed)
Anesthesia Post Note  Patient: Holly Parker  Procedure(s) Performed: REMOVAL OF BILATERAL TISSUE EXPANDERS WITH PLACEMENT OF BILATERAL BREAST IMPLANTS (Bilateral )  Patient location during evaluation: Endoscopy Anesthesia Type: General Level of consciousness: awake and alert Pain management: pain level controlled Vital Signs Assessment: post-procedure vital signs reviewed and stable Respiratory status: spontaneous breathing, nonlabored ventilation, respiratory function stable and patient connected to nasal cannula oxygen Cardiovascular status: blood pressure returned to baseline and stable Postop Assessment: no apparent nausea or vomiting Anesthetic complications: no     Last Vitals:  Vitals:   04/01/18 1427 04/01/18 1448  BP: (!) 103/58 (!) 108/49  Pulse: 91 96  Resp: 16   Temp: 36.6 C   SpO2: 96% 100%    Last Pain:  Vitals:   04/02/18 0930  TempSrc:   PainSc: 1                  Martha Clan

## 2018-04-06 ENCOUNTER — Inpatient Hospital Stay (HOSPITAL_BASED_OUTPATIENT_CLINIC_OR_DEPARTMENT_OTHER): Admitting: Internal Medicine

## 2018-04-06 ENCOUNTER — Encounter: Payer: Self-pay | Admitting: Internal Medicine

## 2018-04-06 ENCOUNTER — Inpatient Hospital Stay: Attending: Internal Medicine

## 2018-04-06 VITALS — BP 103/66 | HR 71 | Temp 97.9°F | Resp 16 | Wt 147.8 lb

## 2018-04-06 DIAGNOSIS — G4700 Insomnia, unspecified: Secondary | ICD-10-CM | POA: Insufficient documentation

## 2018-04-06 DIAGNOSIS — R748 Abnormal levels of other serum enzymes: Secondary | ICD-10-CM | POA: Insufficient documentation

## 2018-04-06 DIAGNOSIS — L271 Localized skin eruption due to drugs and medicaments taken internally: Secondary | ICD-10-CM

## 2018-04-06 DIAGNOSIS — Z171 Estrogen receptor negative status [ER-]: Secondary | ICD-10-CM | POA: Insufficient documentation

## 2018-04-06 DIAGNOSIS — Z87891 Personal history of nicotine dependence: Secondary | ICD-10-CM | POA: Insufficient documentation

## 2018-04-06 DIAGNOSIS — C50211 Malignant neoplasm of upper-inner quadrant of right female breast: Secondary | ICD-10-CM

## 2018-04-06 LAB — COMPREHENSIVE METABOLIC PANEL
ALT: 15 U/L (ref 0–44)
AST: 19 U/L (ref 15–41)
Albumin: 4.3 g/dL (ref 3.5–5.0)
Alkaline Phosphatase: 128 U/L — ABNORMAL HIGH (ref 38–126)
Anion gap: 7 (ref 5–15)
BUN: 17 mg/dL (ref 6–20)
CO2: 29 mmol/L (ref 22–32)
Calcium: 9.3 mg/dL (ref 8.9–10.3)
Chloride: 101 mmol/L (ref 98–111)
Creatinine, Ser: 0.8 mg/dL (ref 0.44–1.00)
GFR calc Af Amer: >60 mL/min (ref >60)
GFR calc non Af Amer: >60 mL/min (ref >60)
Glucose, Bld: 96 mg/dL (ref 70–99)
POTASSIUM: 4.5 mmol/L (ref 3.5–5.1)
Sodium: 137 mmol/L (ref 135–145)
Total Bilirubin: 1 mg/dL (ref 0.3–1.2)
Total Protein: 7 g/dL (ref 6.5–8.1)

## 2018-04-06 LAB — CBC WITH DIFFERENTIAL/PLATELET
Abs Immature Granulocytes: 0.03 10*3/uL (ref 0.00–0.07)
Basophils Absolute: 0 10*3/uL (ref 0.0–0.1)
Basophils Relative: 1 %
Eosinophils Absolute: 0.5 10*3/uL (ref 0.0–0.5)
Eosinophils Relative: 10 %
HCT: 32.5 % — ABNORMAL LOW (ref 36.0–46.0)
Hemoglobin: 11.2 g/dL — ABNORMAL LOW (ref 12.0–15.0)
Immature Granulocytes: 1 %
Lymphocytes Relative: 27 %
Lymphs Abs: 1.5 10*3/uL (ref 0.7–4.0)
MCH: 37.1 pg — ABNORMAL HIGH (ref 26.0–34.0)
MCHC: 34.5 g/dL (ref 30.0–36.0)
MCV: 107.6 fL — ABNORMAL HIGH (ref 80.0–100.0)
Monocytes Absolute: 0.7 10*3/uL (ref 0.1–1.0)
Monocytes Relative: 12 %
Neutro Abs: 2.8 10*3/uL (ref 1.7–7.7)
Neutrophils Relative %: 49 %
Platelets: 176 10*3/uL (ref 150–400)
RBC: 3.02 MIL/uL — ABNORMAL LOW (ref 3.87–5.11)
RDW: 15.3 % (ref 11.5–15.5)
WBC: 5.5 10*3/uL (ref 4.0–10.5)
nRBC: 0 % (ref 0.0–0.2)

## 2018-04-06 NOTE — Progress Notes (Signed)
[facility] OFFICE PROGRESS NOTE  Patient Care Team: Myer Peer, MD as PCP - General (Family Medicine) Magrinat, Virgie Dad, MD as Consulting Physician (Oncology) Marga Hoots, MD (Obstetrics and Gynecology) Dillingham, Loel Lofty, DO as Attending Physician (Plastic Surgery) Coralie Keens, MD as Consulting Physician (General Surgery) Cammie Sickle, MD as Consulting Physician (Internal Medicine) Rico Junker, RN as Oncology Nurse Navigator Jovita Kussmaul, MD as Consulting Physician (General Surgery)  Cancer Staging No matching staging information was found for the patient.   Oncology History   # OCT 2018- RIGHT BREAST UIQ -[place]; TRIPLE NEGATIVE; ki-67-high [747m-1'O clock-Bx- proven; [place]]; another- 12'O clock-495m Not biopsied. [s/p Dr.Magrinaut; [place]]; cT1c(m) cN0;  G-3; ki-67- 80%  # 11/218- ddACesolved;  x4 cyles- US/ammo- improved 47m92mothe leison-r-Taxol+carbo [finished ealry April 2019]  # MAY 2019- s/p Mastec& SLNBx [Dr.Toth; [place]]; 6mm24msidual ca; 3 SLN-NEG [ypT1bsnN0]  # BRCA-1 Gene mutated [mom-ovarian cancer-Stage IIIB/Brca; older sister- breast ca/brca-mutated]  # MUGA scan [nov [id number]]%; UPBEAT clinical trial; port explanted.   # TAH &BSO [s/p Oct 29th/Dr.Pearson; breast recon reconstruction- dec 3rd] --------------------------------------------------------   DIAGNOSIS: TRIPLE NEGATIVE BREAST CA  STAGE:  I   ; GOALS: CURATIVE  CURRENT/MOST RECENT THERAPY; Xeloda       Malignant neoplasm of upper-inner quadrant of right breast in female, estrogen receptor negative (HCC)[place]   INTERVAL HISTORY:  Holly [id number].  female pleasant patient above history of stage I triple negative breast cancer currently on adjuvant Xeloda is here for follow-up.  Patient in the interim underwent reconstruction surgery for her breasts/mastectomy on December 3.  Uneventful.  She is recuperating fairly well.  Continues to have mild  rash on hands and feet.  Denies any diarrhea sores in the mouth or fevers or chills.  No bone pain.  Review of Systems  Constitutional: Negative for chills, diaphoresis, fever, malaise/fatigue and weight loss.  HENT: Negative for nosebleeds and sore throat.   Eyes: Negative for double vision.  Respiratory: Negative for cough, hemoptysis, sputum production, shortness of breath and wheezing.   Cardiovascular: Negative for chest pain, palpitations, orthopnea and leg swelling.  Gastrointestinal: Negative for abdominal pain, blood in stool, constipation, diarrhea, heartburn, melena, nausea and vomiting.  Genitourinary: Negative for dysuria, frequency and urgency.  Musculoskeletal: Negative for back pain and joint pain.  Skin: Positive for rash.       Mild peeling of the skin on hands.  Neurological: Negative for dizziness, tingling, focal weakness, weakness and headaches.  Endo/Heme/Allergies: Does not bruise/bleed easily.  Psychiatric/Behavioral: Negative for depression. The patient is not nervous/anxious and does not have insomnia.       PAST MEDICAL HISTORY :  Past Medical History:  Diagnosis Date  . Anemia    iron deficiency during chemotherapy  . Anxiety   . Asthma    Excercise induced as a child  . Fibromyalgia   . Headache    migraine headaches due to sinus/allergies and teeth grinding at night  . Malignant neoplasm of upper-inner quadrant of right breast in female, estrogen receptor negative (HCC)[place]18   right breast  . Personal history of chemotherapy    Right breast- 4 treatments    PAST SURGICAL HISTORY :   Past Surgical History:  Procedure Laterality Date  . ABDOMINAL HYSTERECTOMY    . BREAST RECONSTRUCTION WITH PLACEMENT OF TISSUE EXPANDER AND FLEX HD (ACELLULAR HYDRATED DERMIS) Bilateral 09/15/2017   Procedure: BREAST RECONSTRUCTION WITH PLACEMENT OF TISSUE  EXPANDER AND FLEX HD (ACELLULAR HYDRATED DERMIS);  Surgeon: Wallace Going, DO;  Location: [facility] ORS;   Service: Plastics;  Laterality: Bilateral;  . IR IMAGING GUIDED PORT INSERTION  03/17/2017  . KNEE SURGERY Left 2009   Torn meniscus   . NIPPLE SPARING MASTECTOMY/SENTINAL LYMPH NODE BIOPSY/RECONSTRUCTION/PLACEMENT OF TISSUE EXPANDER Bilateral 09/15/2017   Procedure: BILATERAL NIPPLE SPARING MASTECTOMIES  WITH RIGHT  SENTINAL LYMPH NODE BIOPSY;  Surgeon: Jovita Kussmaul, MD;  Location: [facility] ORS;  Service: General;  Laterality: Bilateral;  . PORT-A-CATH REMOVAL N/A 09/15/2017   Procedure: REMOVAL PORT-A-CATH;  Surgeon: Jovita Kussmaul, MD;  Location: [facility] ORS;  Service: General;  Laterality: N/A;  . REMOVAL OF BILATERAL TISSUE EXPANDERS WITH PLACEMENT OF BILATERAL BREAST IMPLANTS Bilateral 04/01/2018   Procedure: REMOVAL OF BILATERAL TISSUE EXPANDERS WITH PLACEMENT OF BILATERAL BREAST IMPLANTS;  Surgeon: Wallace Going, DO;  Location: [facility] ORS;  Service: Plastics;  Laterality: Bilateral;  . RHINOPLASTY  [id number]   both surgeries due to broken nose    FAMILY HISTORY :   Family History  Problem Relation Age of Onset  . Cancer Mother        Ovarian cancer (2003), breast cancer(2017) & squamous cell   . Leukemia Mother   . Breast cancer Mother 88  . Cancer Sister        breast cancer  . Breast cancer Sister 61  . Melanoma Father   . Lung cancer Paternal Grandmother     SOCIAL HISTORY:   Social History   Tobacco Use  . Smoking status: Former Smoker    Packs/day: 0.25    Types: Cigarettes    Last attempt to quit: 09/16/2011    Years since quitting: 6.5  . Smokeless tobacco: Never Used  Substance Use Topics  . Alcohol use: No  . Drug use: No    ALLERGIES:  has No Known Allergies.  MEDICATIONS:  Current Outpatient Medications  Medication Sig Dispense Refill  . acetaminophen (TYLENOL) 325 MG tablet Take 1 tablet (325 mg total) by mouth every 6 (six) hours.    . capecitabine (XELODA) 150 MG tablet TAKE 2 TABLETS (300 MG TOTAL) BY MOUTH 2 TIMES DAILY AFTER A MEAL. TAKE FOR 14  DAYS ON, THEN 7 DAYS OFF. TAKE WITH THREE 500MG TABLETS (Patient taking differently: Take 300 mg by mouth 2 (two) times daily after a meal. Take for 14 days then 7 days off) 56 tablet 5  . capecitabine (XELODA) 500 MG tablet TAKE 3 TABLETS (1,500 MG TOTAL) BY MOUTH 2 (TWO) TIMES DAILY AFTER A MEAL. TAKE FOR 14 DAYS ON, THEN 7 DAYS OFF. TAKE WITH TWO 150MG TABLETS 84 tablet 5  . cromolyn (NASALCROM) 5.2 MG/ACT nasal spray Place 1 spray into both nostrils 4 (four) times daily as needed for allergies or rhinitis. 26 mL 0  . loratadine (CLARITIN) 10 MG tablet Take 10 mg daily by mouth.    . Multiple Vitamin (MULTIVITAMIN WITH MINERALS) TABS tablet Take 1 tablet by mouth daily. One-A-Day    . naproxen (NAPROSYN) 500 MG tablet Take 1 tablet (500 mg total) by mouth 2 (two) times daily as needed for mild pain.    . polyethylene glycol (MIRALAX / GLYCOLAX) packet Take 17 g by mouth daily as needed for mild constipation. 14 each 0  . pseudoephedrine-acetaminophen (TYLENOL SINUS) 30-500 MG TABS tablet Take 2 tablets by mouth daily.     Marland Kitchen senna (SENOKOT) 8.6 MG TABS tablet Take 1 tablet (8.6 mg total) by mouth 2 (  two) times daily. 120 each 0  . sertraline (ZOLOFT) 100 MG tablet Take 1 tablet (100 mg total) by mouth at bedtime. 30 tablet 4  . triamcinolone ointment (KENALOG) 0.5 % Apply 1 application topically 2 (two) times daily. (Patient taking differently: Apply 1 application topically daily. ) 30 g 0  . urea (CARMOL) 10 % cream Apply topically as needed. (Patient taking differently: Apply 1 application topically 2 (two) times daily. ) 71 g 0  . zolpidem (AMBIEN) 5 MG tablet Take 1 tablet (5 mg total) by mouth at bedtime as needed for sleep. (Patient taking differently: Take 5 mg by mouth at bedtime. ) 30 tablet 0   No current facility-administered medications for this visit.     PHYSICAL EXAMINATION: ECOG PERFORMANCE STATUS: 0 - Asymptomatic  BP 103/66 (BP Location: Left Arm, Patient Position: Sitting)    Pulse 71   Temp 97.9 F (36.6 C) (Tympanic)   Resp 16   Wt 147 lb 12.8 oz (67 kg)   LMP 05/28/2017 (Approximate) Comment: no periods since start of chemo  BMI 23.15 kg/m   Filed Weights   04/06/18 1107  Weight: 147 lb 12.8 oz (67 kg)    Physical Exam  Constitutional: She is oriented to person, place, and time and well-developed, well-nourished, and in no distress.  Accompanied by husband.  HENT:  Head: Normocephalic and atraumatic.  Mouth/Throat: Oropharynx is clear and moist. No oropharyngeal exudate.  Eyes: Pupils are equal, round, and reactive to light.  Neck: Normal range of motion. Neck supple.  Cardiovascular: Normal rate and regular rhythm.  Pulmonary/Chest: No respiratory distress. She has no wheezes.  Abdominal: Soft. Bowel sounds are normal. She exhibits no distension and no mass. There is no tenderness. There is no rebound and no guarding.  Musculoskeletal: Normal range of motion. She exhibits no edema or tenderness.  Neurological: She is alert and oriented to person, place, and time.  Skin: Skin is warm.  Mild desquamation of hands.  Psychiatric: Affect normal.      LABORATORY DATA:  I have reviewed the data as listed    Component Value Date/Time   NA 137 04/06/2018 1043   K 4.5 04/06/2018 1043   CL 101 04/06/2018 1043   CO2 29 04/06/2018 1043   GLUCOSE 96 04/06/2018 1043   BUN 17 04/06/2018 1043   CREATININE 0.80 04/06/2018 1043   CALCIUM 9.3 04/06/2018 1043   PROT 7.0 04/06/2018 1043   ALBUMIN 4.3 04/06/2018 1043   AST 19 04/06/2018 1043   ALT 15 04/06/2018 1043   ALKPHOS 128 (H) 04/06/2018 1043   BILITOT 1.0 04/06/2018 1043   GFRNONAA >60 04/06/2018 1043   GFRAA >60 04/06/2018 1043    No results found for: SPEP, UPEP  Lab Results  Component Value Date   WBC 5.5 04/06/2018   NEUTROABS 2.8 04/06/2018   HGB 11.2 (L) 04/06/2018   HCT 32.5 (L) 04/06/2018   MCV 107.6 (H) 04/06/2018   PLT 176 04/06/2018      Chemistry      Component Value  Date/Time   NA 137 04/06/2018 1043   K 4.5 04/06/2018 1043   CL 101 04/06/2018 1043   CO2 29 04/06/2018 1043   BUN 17 04/06/2018 1043   CREATININE 0.80 04/06/2018 1043      Component Value Date/Time   CALCIUM 9.3 04/06/2018 1043   ALKPHOS 128 (H) 04/06/2018 1043   AST 19 04/06/2018 1043   ALT 15 04/06/2018 1043   BILITOT 1.0 04/06/2018 1043  RADIOGRAPHIC STUDIES: I have personally reviewed the radiological images as listed and agreed with the findings in the report. No results found.   ASSESSMENT & PLAN:  Malignant neoplasm of upper-inner quadrant of right breast in female, estrogen receptor negative ([place]) # Stage I triple negative breast cancer; post neoadjuvant chemotherapy- 6 mm residual disease.  Currently on adjuvant Xeloda.  Stable.  S/p cycle #6.    # proceed with cycle # 7 [starting today] x2 weeks [christmas]; Labs today reviewed;  acceptable for treatment today.   # start last cycle on dec 30th/ beach vacation; follow-up with me on January 6 labs.  #Mild elevation of alk phosphatase 128; previously normal.  Suspected benign causes.  Will repeat labs at next visit.  # hand foot syndrome- G-1;sec to xeloda-  on urea/ vaseline.  Stable.   # Insomnia-continue Ambien; stable  # DISPOSITION: # follow up on Jan 6th-[facility]- cbc/cmp- Dr.B   No orders of the defined types were placed in this encounter.  All questions were answered. The patient knows to call the clinic with any problems, questions or concerns.      Cammie Sickle, MD 04/06/2018 12:55 PM

## 2018-04-06 NOTE — Assessment & Plan Note (Addendum)
#  Stage I triple negative breast cancer; post neoadjuvant chemotherapy- 6 mm residual disease.  Currently on adjuvant Xeloda.  Stable.  S/p cycle #6.    # proceed with cycle # 7 [starting today] x2 weeks [christmas]; Labs today reviewed;  acceptable for treatment today.   # start last cycle on dec 30th/ beach vacation; follow-up with me on January 6 labs.  #Mild elevation of alk phosphatase 128; previously normal.  Suspected benign causes.  Will repeat labs at next visit.  # hand foot syndrome- G-1;sec to xeloda-  on urea/ vaseline.  Stable.   # Insomnia-continue Ambien; stable  # DISPOSITION: # follow up on Jan 6th-[facility]- cbc/cmp- Dr.B

## 2018-04-07 ENCOUNTER — Ambulatory Visit (INDEPENDENT_AMBULATORY_CARE_PROVIDER_SITE_OTHER): Admitting: Plastic Surgery

## 2018-04-07 ENCOUNTER — Encounter: Payer: Self-pay | Admitting: Plastic Surgery

## 2018-04-07 VITALS — BP 90/60 | HR 87 | Ht 66.0 in | Wt 147.8 lb

## 2018-04-07 DIAGNOSIS — Z9889 Other specified postprocedural states: Secondary | ICD-10-CM

## 2018-04-07 DIAGNOSIS — Z9013 Acquired absence of bilateral breasts and nipples: Secondary | ICD-10-CM

## 2018-04-07 NOTE — Progress Notes (Signed)
   Subjective:    Patient ID: Holly Parker, female    DOB: 10-13-1980, 37 y.o.   MRN: [id number]  Holly Parker is a 37 year old white female here for a postop follow-up.  She underwent second stage of her reconstruction with bilateral removal of expanders in her breasts and placement of silicone implants.  She is doing extremely well.  She states that her arm movement feels better.  Her pain is been very minimal.  No redness, swelling, seroma or sign of infection.  She is very happy with the reconstruction.  Her incisions are intact.   Review of Systems  Constitutional: Negative.   HENT: Negative.   Eyes: Negative.   Respiratory: Negative.   Gastrointestinal: Negative.   Genitourinary: Negative.   Musculoskeletal: Negative.   Skin: Negative.        Objective:   Physical Exam  Constitutional: She is oriented to person, place, and time. She appears well-developed and well-nourished.  HENT:  Head: Normocephalic and atraumatic.  Cardiovascular: Normal rate.  Pulmonary/Chest: Effort normal.  Neurological: She is alert and oriented to person, place, and time.  Skin: Skin is warm.  Psychiatric: She has a normal mood and affect. Her behavior is normal. Judgment and thought content normal.      Assessment & Plan:  Status post bilateral breast reconstruction  S/P mastectomy, bilateral I requested that she not try to do too much repetitive movement at home.  Range of motion is fine.  I will see her back in 3 weeks unless she needs me sooner.

## 2018-04-10 ENCOUNTER — Other Ambulatory Visit: Payer: Self-pay | Admitting: *Deleted

## 2018-04-10 MED ORDER — ZOLPIDEM TARTRATE 5 MG PO TABS: 5.0000 mg | ORAL_TABLET | Freq: Every evening | ORAL | 0 refills | Status: DC | PRN

## 2018-04-13 ENCOUNTER — Telehealth: Payer: Self-pay | Admitting: *Deleted

## 2018-04-13 MED FILL — CAPECITABINE 150 MG TABS: 150 | 14 days supply | Qty: 56 | Fill #1

## 2018-04-13 MED FILL — CAPECITABINE 500 MG TABS: 500 | 14 days supply | Qty: 84 | Fill #1

## 2018-04-13 NOTE — Telephone Encounter (Signed)
Spoke with [place] outpatient oncology specilaty. Med was to be shipped out on 12/18, but if patient desires she can go to  to get the drug today. Drug shipment can be changed to today for shipment to be rcvd by Tuesday am. I spoke with Asencion Partridge and gave her the phone number to reach [facility] if she desires to go to [place] today to pick up med. Samiha stated that she will just wait for the shipment to be delivered tom.

## 2018-04-13 NOTE — Telephone Encounter (Signed)
Reports that she was to restart er Xeloda today, but her medicine has not come yet, she wants to know what to do.

## 2018-04-27 ENCOUNTER — Telehealth: Payer: Self-pay | Admitting: *Deleted

## 2018-04-27 NOTE — Telephone Encounter (Signed)
Patient left msg on triage line-requesting call back to discuss oral chemotherapy. I attempted to reach patient, but unable to get in touch with her.

## 2018-05-04 ENCOUNTER — Encounter: Payer: Self-pay | Admitting: Internal Medicine

## 2018-05-04 ENCOUNTER — Other Ambulatory Visit: Payer: Self-pay | Admitting: *Deleted

## 2018-05-04 ENCOUNTER — Inpatient Hospital Stay (HOSPITAL_BASED_OUTPATIENT_CLINIC_OR_DEPARTMENT_OTHER): Admitting: Internal Medicine

## 2018-05-04 ENCOUNTER — Inpatient Hospital Stay: Attending: Internal Medicine

## 2018-05-04 VITALS — BP 106/71 | HR 84 | Resp 16 | Wt 149.0 lb

## 2018-05-04 DIAGNOSIS — G4700 Insomnia, unspecified: Secondary | ICD-10-CM | POA: Insufficient documentation

## 2018-05-04 DIAGNOSIS — Z87891 Personal history of nicotine dependence: Secondary | ICD-10-CM

## 2018-05-04 DIAGNOSIS — Z1502 Genetic susceptibility to malignant neoplasm of ovary: Principal | ICD-10-CM

## 2018-05-04 DIAGNOSIS — Z171 Estrogen receptor negative status [ER-]: Secondary | ICD-10-CM | POA: Insufficient documentation

## 2018-05-04 DIAGNOSIS — C50211 Malignant neoplasm of upper-inner quadrant of right female breast: Secondary | ICD-10-CM | POA: Insufficient documentation

## 2018-05-04 DIAGNOSIS — R5383 Other fatigue: Secondary | ICD-10-CM

## 2018-05-04 DIAGNOSIS — R21 Rash and other nonspecific skin eruption: Secondary | ICD-10-CM

## 2018-05-04 DIAGNOSIS — Z1501 Genetic susceptibility to malignant neoplasm of breast: Secondary | ICD-10-CM

## 2018-05-04 LAB — COMPREHENSIVE METABOLIC PANEL
ALT: 21 U/L (ref 0–44)
AST: 25 U/L (ref 15–41)
Albumin: 4.6 g/dL (ref 3.5–5.0)
Alkaline Phosphatase: 118 U/L (ref 38–126)
Anion gap: 6 (ref 5–15)
BUN: 20 mg/dL (ref 6–20)
CO2: 30 mmol/L (ref 22–32)
Calcium: 9.6 mg/dL (ref 8.9–10.3)
Chloride: 102 mmol/L (ref 98–111)
Creatinine, Ser: 0.75 mg/dL (ref 0.44–1.00)
GFR calc Af Amer: >60 mL/min (ref >60)
GFR calc non Af Amer: >60 mL/min (ref >60)
GLUCOSE: 92 mg/dL (ref 70–99)
Potassium: 4.4 mmol/L (ref 3.5–5.1)
SODIUM: 138 mmol/L (ref 135–145)
Total Bilirubin: 0.8 mg/dL (ref 0.3–1.2)
Total Protein: 7.6 g/dL (ref 6.5–8.1)

## 2018-05-04 LAB — CBC WITH DIFFERENTIAL/PLATELET
Abs Immature Granulocytes: 0.01 10*3/uL (ref 0.00–0.07)
Basophils Absolute: 0 10*3/uL (ref 0.0–0.1)
Basophils Relative: 1 %
Eosinophils Absolute: 0.4 10*3/uL (ref 0.0–0.5)
Eosinophils Relative: 8 %
HCT: 36.1 % (ref 36.0–46.0)
Hemoglobin: 12.3 g/dL (ref 12.0–15.0)
IMMATURE GRANULOCYTES: 0 %
LYMPHS PCT: 29 %
Lymphs Abs: 1.4 10*3/uL (ref 0.7–4.0)
MCH: 35.9 pg — ABNORMAL HIGH (ref 26.0–34.0)
MCHC: 34.1 g/dL (ref 30.0–36.0)
MCV: 105.2 fL — ABNORMAL HIGH (ref 80.0–100.0)
Monocytes Absolute: 0.5 10*3/uL (ref 0.1–1.0)
Monocytes Relative: 11 %
Neutro Abs: 2.6 10*3/uL (ref 1.7–7.7)
Neutrophils Relative %: 51 %
Platelets: 161 10*3/uL (ref 150–400)
RBC: 3.43 MIL/uL — ABNORMAL LOW (ref 3.87–5.11)
RDW: 14.9 % (ref 11.5–15.5)
WBC: 5 10*3/uL (ref 4.0–10.5)
nRBC: 0 % (ref 0.0–0.2)

## 2018-05-04 NOTE — Assessment & Plan Note (Addendum)
#  Stage I triple negative breast cancer; post neoadjuvant chemotherapy- 6 mm residual disease.  Adjuvant Xeloda-  S/p cycle #8 [finished May 03, 2018] .    # clinically, NED- will get baseline CT scans in 3 months.   #Mild elevation of alk phosphatase 128; previously normal; back to normal at 118 today.  Improved.  # hand foot syndrome- G-1;sec to xeloda-  on urea/ vaseline. Stable.    # Insomnia-continue Ambien; stable. Will call; discussed re: weaning off.   # exposure daughter with strep- will call if symtomatic/Zpak.   # DISPOSITION: # follow up in 3 months; labs- cbc/cmp/CT c/Ap/P prior- Dr.B

## 2018-05-04 NOTE — Progress Notes (Signed)
[facility] OFFICE PROGRESS NOTE  Patient Care Team: Myer Peer, MD as PCP - General (Family Medicine) Magrinat, Virgie Dad, MD as Consulting Physician (Oncology) Marga Hoots, MD (Obstetrics and Gynecology) Dillingham, Loel Lofty, DO as Attending Physician (Plastic Surgery) Coralie Keens, MD as Consulting Physician (General Surgery) Cammie Sickle, MD as Consulting Physician (Internal Medicine) Rico Junker, RN as Oncology Nurse Navigator Jovita Kussmaul, MD as Consulting Physician (General Surgery)  Cancer Staging No matching staging information was found for the patient.   Oncology History   # OCT 2018- RIGHT BREAST UIQ -[place]; TRIPLE NEGATIVE; ki-67-high [100m-1'O clock-Bx- proven; [place]]; another- 12'O clock-460m Not biopsied. [s/p Dr.Magrinaut; [place]]; cT1c(m) cN0;  G-3; ki-67- 80%  # 11/218- ddACesolved;  x4 cyles- US/ammo- improved 8m38mothe leison-r-Taxol+carbo [finished ealry April 2019]  # MAY 2019- s/p Mastec& SLNBx [Dr.Toth; [place]]; 6mm36msidual ca; 3 SLN-NEG [ypT1bsnN0]; s/p 8 cycles of xeloda [finished jan 5th 2020]  # BRCA-1 Gene mutated [mom-ovarian cancer-Stage IIIB/Brca; older sister- breast ca/brca-mutated]  # MUGA scan [nov [id number]]%; UPBEAT clinical trial; port explanted.   # TAH &BSO [s/p Oct 29th/Dr.Pearson; breast recon reconstruction- dec 3rd] --------------------------------------------------------   DIAGNOSIS: TRIPLE NEGATIVE BREAST CA  STAGE:  I   ; GOALS: CURATIVE  CURRENT/MOST RECENT THERAPY: surveillaince      Malignant neoplasm of upper-inner quadrant of right breast in female, estrogen receptor negative (HCC)[place]   INTERVAL HISTORY:  Holly Parker.  female pleasant patient above history of stage I triple negative breast cancer currently on adjuvant Xeloda is here for follow-up.   Patient with cycle #8 of Xeloda yesterday.  Continues to have dry/rash on hand and feet.  Denies any diarrhea  sores in the mouth.  Complains of mild to moderate fatigue.  Review of Systems  Constitutional: Negative for chills, diaphoresis, fever, malaise/fatigue and weight loss.  HENT: Negative for nosebleeds and sore throat.   Eyes: Negative for double vision.  Respiratory: Negative for cough, hemoptysis, sputum production, shortness of breath and wheezing.   Cardiovascular: Negative for chest pain, palpitations, orthopnea and leg swelling.  Gastrointestinal: Negative for abdominal pain, blood in stool, constipation, diarrhea, heartburn, melena, nausea and vomiting.  Genitourinary: Negative for dysuria, frequency and urgency.  Musculoskeletal: Negative for back pain and joint pain.  Skin: Positive for rash.       Mild peeling of the skin on hands.  Neurological: Negative for dizziness, tingling, focal weakness, weakness and headaches.  Endo/Heme/Allergies: Does not bruise/bleed easily.  Psychiatric/Behavioral: Negative for depression. The patient is not nervous/anxious and does not have insomnia.       PAST MEDICAL HISTORY :  Past Medical History:  Diagnosis Date  . Anemia    iron deficiency during chemotherapy  . Anxiety   . Asthma    Excercise induced as a child  . Fibromyalgia   . Headache    migraine headaches due to sinus/allergies and teeth grinding at night  . Malignant neoplasm of upper-inner quadrant of right breast in female, estrogen receptor negative (HCC)Holly Parker   right breast  . Personal history of chemotherapy    Right breast- 4 treatments    PAST SURGICAL HISTORY :   Past Surgical History:  Procedure Laterality Date  . ABDOMINAL HYSTERECTOMY    . BREAST RECONSTRUCTION WITH PLACEMENT OF TISSUE EXPANDER AND FLEX HD (ACELLULAR HYDRATED DERMIS) Bilateral 09/15/2017   Procedure: BREAST RECONSTRUCTION WITH PLACEMENT OF TISSUE EXPANDER AND FLEX HD (ACELLULAR HYDRATED DERMIS);  Surgeon: Wallace Going, DO;  Location: [facility] OR[facility];  Service: Plastics;  Laterality: Bilateral;   . IR IMAGING GUIDED PORT INSERTION  03/17/2017  . KNEE SURGERY Left 2009   Torn meniscus   . NIPPLE SPARING MASTECTOMY/SENTINAL LYMPH NODE BIOPSY/RECONSTRUCTION/PLACEMENT OF TISSUE EXPANDER Bilateral 09/15/2017   Procedure: BILATERAL NIPPLE SPARING MASTECTOMIES  WITH RIGHT  SENTINAL LYMPH NODE BIOPSY;  Surgeon: Jovita Kussmaul, MD;  Location: [facility];  Service: General;  Laterality: Bilateral;  . PORT-A-CATH REMOVAL N/A 09/15/2017   Procedure: REMOVAL PORT-A-CATH;  Surgeon: Jovita Kussmaul, MD;  Location: [facility];  Service: General;  Laterality: N/A;  . REMOVAL OF BILATERAL TISSUE EXPANDERS WITH PLACEMENT OF BILATERAL BREAST IMPLANTS Bilateral 04/01/2018   Procedure: REMOVAL OF BILATERAL TISSUE EXPANDERS WITH PLACEMENT OF BILATERAL BREAST IMPLANTS;  Surgeon: Wallace Going, DO;  Location: [facility];  Service: Plastics;  Laterality: Bilateral;  . RHINOPLASTY  [id number]   both surgeries due to broken nose    FAMILY HISTORY :   Family History  Problem Relation Age of Onset  . Cancer Mother        Ovarian cancer (2003), breast cancer(2017) & squamous cell   . Leukemia Mother   . Breast cancer Mother 60  . Cancer Sister        breast cancer  . Breast cancer Sister 26  . Melanoma Father   . Lung cancer Paternal Grandmother     SOCIAL HISTORY:   Social History   Tobacco Use  . Smoking status: Former Smoker    Packs/day: 0.25    Types: Cigarettes    Last attempt to quit: 09/16/2011    Years since quitting: 6.6  . Smokeless tobacco: Never Used  Substance Use Topics  . Alcohol use: No  . Drug use: No    ALLERGIES:  has No Known Allergies.  MEDICATIONS:  Current Outpatient Medications  Medication Sig Dispense Refill  . acetaminophen (TYLENOL) 325 MG tablet Take 1 tablet (325 mg total) by mouth every 6 (six) hours.    . capecitabine (XELODA) 150 MG tablet TAKE 2 TABLETS (300 MG TOTAL) BY MOUTH 2 TIMES DAILY AFTER A MEAL. TAKE FOR 14 DAYS ON, THEN 7 DAYS OFF. TAKE WITH THREE  500MG TABLETS (Patient taking differently: Take 300 mg by mouth 2 (two) times daily after a meal. Take for 14 days then 7 days off) 56 tablet 5  . capecitabine (XELODA) 500 MG tablet TAKE 3 TABLETS (1,500 MG TOTAL) BY MOUTH 2 (TWO) TIMES DAILY AFTER A MEAL. TAKE FOR 14 DAYS ON, THEN 7 DAYS OFF. TAKE WITH TWO 150MG TABLETS 84 tablet 5  . cromolyn (NASALCROM) 5.2 MG/ACT nasal spray Place 1 spray into both nostrils 4 (four) times daily as needed for allergies or rhinitis. 26 mL 0  . loratadine (CLARITIN) 10 MG tablet Take 10 mg daily by mouth.    . Multiple Vitamin (MULTIVITAMIN WITH MINERALS) TABS tablet Take 1 tablet by mouth daily. One-A-Day    . naproxen (NAPROSYN) 500 MG tablet Take 1 tablet (500 mg total) by mouth 2 (two) times daily as needed for mild pain.    . polyethylene glycol (MIRALAX / GLYCOLAX) packet Take 17 g by mouth daily as needed for mild constipation. 14 each 0  . pseudoephedrine-acetaminophen (TYLENOL SINUS) 30-500 MG TABS tablet Take 2 tablets by mouth daily.     Marland Kitchen senna (SENOKOT) 8.6 MG TABS tablet Take 1 tablet (8.6 mg total) by mouth 2 (two) times daily. 120 each 0  .  sertraline (ZOLOFT) 100 MG tablet Take 1 tablet (100 mg total) by mouth at bedtime. 30 tablet 4  . triamcinolone ointment (KENALOG) 0.5 % Apply 1 application topically 2 (two) times daily. (Patient taking differently: Apply 1 application topically daily. ) 30 g 0  . urea (CARMOL) 10 % cream Apply topically as needed. (Patient taking differently: Apply 1 application topically 2 (two) times daily. ) 71 g 0  . zolpidem (AMBIEN) 5 MG tablet Take 1 tablet (5 mg total) by mouth at bedtime as needed for sleep. 30 tablet 0   No current facility-administered medications for this visit.     PHYSICAL EXAMINATION: ECOG PERFORMANCE STATUS: 0 - Asymptomatic  BP 106/71 (BP Location: Left Arm, Patient Position: Sitting)   Pulse 84   Resp 16   Wt 149 lb (67.6 kg)   LMP 05/28/2017 (Approximate) Comment: no periods since  start of chemo  BMI 24.05 kg/m   Filed Weights   05/04/18 1028  Weight: 149 lb (67.6 kg)    Physical Exam  Constitutional: She is oriented to person, place, and time and well-developed, well-nourished, and in no distress.  Is alone.  HENT:  Head: Normocephalic and atraumatic.  Mouth/Throat: Oropharynx is clear and moist. No oropharyngeal exudate.  Eyes: Pupils are equal, round, and reactive to light.  Neck: Normal range of motion. Neck supple.  Cardiovascular: Normal rate and regular rhythm.  Pulmonary/Chest: No respiratory distress. She has no wheezes.  Abdominal: Soft. Bowel sounds are normal. She exhibits no distension and no mass. There is no abdominal tenderness. There is no rebound and no guarding.  Musculoskeletal: Normal range of motion.        General: No tenderness or edema.  Neurological: She is alert and oriented to person, place, and time.  Skin: Skin is warm.  Mild desquamation of hands.  Psychiatric: Affect normal.      LABORATORY DATA:  I have reviewed the data as listed    Component Value Date/Time   NA 138 05/04/2018 0957   K 4.4 05/04/2018 0957   CL 102 05/04/2018 0957   CO2 30 05/04/2018 0957   GLUCOSE 92 05/04/2018 0957   BUN 20 05/04/2018 0957   CREATININE 0.75 05/04/2018 0957   CALCIUM 9.6 05/04/2018 0957   PROT 7.6 05/04/2018 0957   ALBUMIN 4.6 05/04/2018 0957   AST 25 05/04/2018 0957   ALT 21 05/04/2018 0957   ALKPHOS 118 05/04/2018 0957   BILITOT 0.8 05/04/2018 0957   GFRNONAA >60 05/04/2018 0957   GFRAA >60 05/04/2018 0957    No results found for: SPEP, UPEP  Lab Results  Component Value Date   WBC 5.0 05/04/2018   NEUTROABS 2.6 05/04/2018   HGB 12.3 05/04/2018   HCT 36.1 05/04/2018   MCV 105.2 (H) 05/04/2018   PLT 161 05/04/2018      Chemistry      Component Value Date/Time   NA 138 05/04/2018 0957   K 4.4 05/04/2018 0957   CL 102 05/04/2018 0957   CO2 30 05/04/2018 0957   BUN 20 05/04/2018 0957   CREATININE 0.75  05/04/2018 0957      Component Value Date/Time   CALCIUM 9.6 05/04/2018 0957   ALKPHOS 118 05/04/2018 0957   AST 25 05/04/2018 0957   ALT 21 05/04/2018 0957   BILITOT 0.8 05/04/2018 0957       RADIOGRAPHIC STUDIES: I have personally reviewed the radiological images as listed and agreed with the findings in the report. No results found.  ASSESSMENT & PLAN:  Malignant neoplasm of upper-inner quadrant of right breast in female, estrogen receptor negative ([place]) # Stage I triple negative breast cancer; post neoadjuvant chemotherapy- 6 mm residual disease.  Adjuvant Xeloda-  S/p cycle #8 [finished May 03, 2018] .    # clinically, NED- will get baseline CT scans in 3 months.   #Mild elevation of alk phosphatase 128; previously normal; back to normal at 118 today.  Improved.  # hand foot syndrome- G-1;sec to xeloda-  on urea/ vaseline. Stable.    # Insomnia-continue Ambien; stable. Will call; discussed re: weaning off.   # exposure daughter with strep- will call if symtomatic/Zpak.   # DISPOSITION: # follow up in 3 months; labs- cbc/cmp/CT c/Ap/P prior- Dr.B   Orders Placed This Encounter  Procedures  . CT CHEST W CONTRAST    Standing Status:   Future    Standing Expiration Date:   05/05/2019    Order Specific Question:   If indicated for the ordered procedure, I authorize the administration of contrast media per Radiology protocol    Answer:   Yes    Order Specific Question:   Preferred imaging location?    Answer:   [facility]    Order Specific Question:   Radiology Contrast Protocol - do NOT remove file path    Answer:   \\[email or web address]    Order Specific Question:   ** REASON FOR EXAM (FREE TEXT)    Answer:   breast cancer    Order Specific Question:   Is patient pregnant?    Answer:   No  . CT Abdomen Pelvis W Contrast    Standing Status:   Future    Standing Expiration Date:   05/04/2019    Order Specific Question:   ** REASON FOR  EXAM (FREE TEXT)    Answer:   breast cancer    Order Specific Question:   If indicated for the ordered procedure, I authorize the administration of contrast media per Radiology protocol    Answer:   Yes    Order Specific Question:   Is patient pregnant?    Answer:   No    Order Specific Question:   Preferred imaging location?    Answer:   [facility]    Order Specific Question:   Is Oral Contrast requested for this exam?    Answer:   Yes, Per Radiology protocol    Order Specific Question:   Radiology Contrast Protocol - do NOT remove file path    Answer:   \\[email or web address]   All questions were answered. The patient knows to call the clinic with any problems, questions or concerns.      Cammie Sickle, MD 05/04/2018 5:10 PM

## 2018-05-08 ENCOUNTER — Encounter: Payer: Self-pay | Admitting: Physician Assistant

## 2018-05-08 ENCOUNTER — Ambulatory Visit (INDEPENDENT_AMBULATORY_CARE_PROVIDER_SITE_OTHER): Admitting: Physician Assistant

## 2018-05-08 VITALS — BP 101/65 | HR 81 | Temp 98.5°F | Ht 67.0 in | Wt 149.0 lb

## 2018-05-08 DIAGNOSIS — C50211 Malignant neoplasm of upper-inner quadrant of right female breast: Secondary | ICD-10-CM

## 2018-05-08 DIAGNOSIS — Z9889 Other specified postprocedural states: Secondary | ICD-10-CM

## 2018-05-08 DIAGNOSIS — Z171 Estrogen receptor negative status [ER-]: Secondary | ICD-10-CM

## 2018-05-08 NOTE — Progress Notes (Signed)
  Subjective:     Patient ID: [name], female   DOB: 1980-08-27, 38 y.o.   MRN: [id number]  HPI Very pleasant 38 year old white female patient presents the clinic status post removal of bilateral expanders and implant placement on 04/01/18.  The patient reports doing very well.  She reports only mild discomfort.  Patient is very pleased with her result.  Her incisions have healed well and are barely noticeable.  Review of Systems  Constitutional: Negative.   Respiratory: Negative.   Cardiovascular: Negative.   Gastrointestinal: Negative.   Skin: Negative.   Psychiatric/Behavioral: Negative.        Objective:   Physical Exam Constitutional:      Appearance: Normal appearance.  Abdominal:     Palpations: Abdomen is soft.  Skin:    General: Skin is warm and dry.  Neurological:     Mental Status: She is alert and oriented to person, place, and time.  Psychiatric:        Mood and Affect: Mood normal.        Behavior: Behavior normal.        Thought Content: Thought content normal.        Judgment: Judgment normal.        Assessment:     S/p bilateral expander removal and exchange for implants    Plan:     Dr. Marla Roe advised the pt to continue to wear fitted sports bra 24 hours a day for 2 more weeks We will see the pt back in 1 year

## 2018-05-13 ENCOUNTER — Encounter

## 2018-05-13 ENCOUNTER — Other Ambulatory Visit: Payer: Self-pay | Admitting: *Deleted

## 2018-05-13 MED ORDER — ZOLPIDEM TARTRATE 5 MG PO TABS: 5.0000 mg | ORAL_TABLET | Freq: Every evening | ORAL | 0 refills | Status: DC | PRN

## 2018-05-13 NOTE — Telephone Encounter (Signed)
Patient called cancer center requesting refill of Ambien 5 mg nightly.   As mandated by the [place] STOP Act (Strengthen Opioid Misuse Prevention), the [place] Controlled Substance Reporting System ([place]) was reviewed for this patient.  Below is the past 6-months of controlled substance prescriptions as displayed by the registry.  I have personally consulted with my supervising physician, Dr. Rogue Bussing, who agrees that continuation of opiate therapy is medically appropriate at this time and agrees to provide continual monitoring, including urine/blood drug screens, as indicated. Refill is appropriate on or after 05/11/2018.  NCCSRS reviewed:     Faythe Casa, NP 05/13/2018 11:23 AM [phone]

## 2018-06-15 ENCOUNTER — Other Ambulatory Visit: Payer: Self-pay | Admitting: *Deleted

## 2018-06-15 MED ORDER — ZOLPIDEM TARTRATE 5 MG PO TABS: 5.0000 mg | ORAL_TABLET | Freq: Every evening | ORAL | 0 refills | Status: DC | PRN

## 2018-06-15 NOTE — Telephone Encounter (Signed)
Patient called [facility] requesting refill of ambien.   As mandated by the [place] STOP Act (Strengthen Opioid Misuse Prevention), the [place] Controlled Substance Reporting System ([place]) was reviewed for this patient.  Below is the past 27-months of controlled substance prescriptions as displayed by the registry.  I have personally consulted with my supervising physician, Dr. Rogue Bussing, who agrees that continuation of therapy is medically appropriate at this time and agrees to provide continual monitoring, including urine/blood drug screens, as indicated. Prescription sent electronically using [employer] secure transmission to requested pharmacy.   [place] Reviewed. PDMP Review updated in epic.     Beckey Rutter, DNP, AGNP-C [place] at [facility] [phone] (work cell) [phone] (office) 06/15/18 11:17 AM

## 2018-07-17 ENCOUNTER — Other Ambulatory Visit: Payer: Self-pay | Admitting: *Deleted

## 2018-07-17 MED ORDER — ZOLPIDEM TARTRATE 5 MG PO TABS: 5.0000 mg | ORAL_TABLET | Freq: Every evening | ORAL | 0 refills | Status: DC | PRN

## 2018-07-17 NOTE — Telephone Encounter (Signed)
Patient called [facility] requesting refill of ambien.  As mandated by the [place] STOP Act (Strengthen Opioid Misuse Prevention), the [place] Controlled Substance Reporting System ([place]) was reviewed for this patient.  Below is the past 41-months of controlled substance prescriptions as displayed by the registry.  I have personally consulted with my supervising physician, Dr. Rogue Bussing, who agrees that continuation of therapy is medically appropriate at this time and agrees to provide continual monitoring, including urine/blood drug screens, as indicated. Prescription sent electronically using Imp[employer] secure transmission to requested pharmacy.   [place] Reviewed, is appropriate, and updated as reviewed in Epic.   Beckey Rutter, DNP, AGNP-C [place] at [facility] [phone] (work cell) [phone] (office) 07/17/18 11:39 AM

## 2018-07-30 ENCOUNTER — Ambulatory Visit

## 2018-07-30 ENCOUNTER — Other Ambulatory Visit: Payer: Self-pay

## 2018-07-30 DIAGNOSIS — C50211 Malignant neoplasm of upper-inner quadrant of right female breast: Secondary | ICD-10-CM

## 2018-07-30 DIAGNOSIS — Z171 Estrogen receptor negative status [ER-]: Principal | ICD-10-CM

## 2018-07-31 ENCOUNTER — Other Ambulatory Visit: Payer: Self-pay

## 2018-08-03 ENCOUNTER — Inpatient Hospital Stay (HOSPITAL_BASED_OUTPATIENT_CLINIC_OR_DEPARTMENT_OTHER): Admitting: Internal Medicine

## 2018-08-03 ENCOUNTER — Encounter: Payer: Self-pay | Admitting: Internal Medicine

## 2018-08-03 ENCOUNTER — Inpatient Hospital Stay: Attending: Internal Medicine

## 2018-08-03 ENCOUNTER — Other Ambulatory Visit: Payer: Self-pay

## 2018-08-03 DIAGNOSIS — Z171 Estrogen receptor negative status [ER-]: Principal | ICD-10-CM

## 2018-08-03 DIAGNOSIS — Z9221 Personal history of antineoplastic chemotherapy: Secondary | ICD-10-CM

## 2018-08-03 DIAGNOSIS — C50211 Malignant neoplasm of upper-inner quadrant of right female breast: Secondary | ICD-10-CM | POA: Insufficient documentation

## 2018-08-03 LAB — CBC WITH DIFFERENTIAL/PLATELET
Abs Immature Granulocytes: 0.01 10*3/uL (ref 0.00–0.07)
Basophils Absolute: 0 10*3/uL (ref 0.0–0.1)
Basophils Relative: 1 %
Eosinophils Absolute: 0.3 10*3/uL (ref 0.0–0.5)
Eosinophils Relative: 5 %
HCT: 38.8 % (ref 36.0–46.0)
Hemoglobin: 12.7 g/dL (ref 12.0–15.0)
Immature Granulocytes: 0 %
Lymphocytes Relative: 23 %
Lymphs Abs: 1.5 10*3/uL (ref 0.7–4.0)
MCH: 32 pg (ref 26.0–34.0)
MCHC: 32.7 g/dL (ref 30.0–36.0)
MCV: 97.7 fL (ref 80.0–100.0)
Monocytes Absolute: 0.6 10*3/uL (ref 0.1–1.0)
Monocytes Relative: 10 %
Neutro Abs: 3.8 10*3/uL (ref 1.7–7.7)
Neutrophils Relative %: 61 %
Platelets: 231 10*3/uL (ref 150–400)
RBC: 3.97 MIL/uL (ref 3.87–5.11)
RDW: 11.5 % (ref 11.5–15.5)
WBC: 6.3 10*3/uL (ref 4.0–10.5)
nRBC: 0 % (ref 0.0–0.2)

## 2018-08-03 LAB — COMPREHENSIVE METABOLIC PANEL
ALT: 15 U/L (ref 0–44)
AST: 22 U/L (ref 15–41)
Albumin: 4.4 g/dL (ref 3.5–5.0)
Alkaline Phosphatase: 104 U/L (ref 38–126)
Anion gap: 9 (ref 5–15)
BUN: 14 mg/dL (ref 6–20)
CO2: 28 mmol/L (ref 22–32)
Calcium: 9.6 mg/dL (ref 8.9–10.3)
Chloride: 99 mmol/L (ref 98–111)
Creatinine, Ser: 0.7 mg/dL (ref 0.44–1.00)
GFR calc Af Amer: >60 mL/min (ref >60)
GFR calc non Af Amer: >60 mL/min (ref >60)
Glucose, Bld: 90 mg/dL (ref 70–99)
Potassium: 4.7 mmol/L (ref 3.5–5.1)
Sodium: 136 mmol/L (ref 135–145)
Total Bilirubin: 0.7 mg/dL (ref 0.3–1.2)
Total Protein: 7.6 g/dL (ref 6.5–8.1)

## 2018-08-03 NOTE — Assessment & Plan Note (Addendum)
#   Stage I triple negative breast cancer; post neoadjuvant chemotherapy- 6 mm residual disease.  Adjuvant Xeloda-  S/p cycle #8 [finished May 03, 2018] .    #Clinically no evidence of recurrence.  Given the COVID-19 pandemic-we will push out the baseline CT scans in 3 months.  Will order CT scans at next visit.  # hand foot syndrome- G-1;sec to xeloda-resolved.  # Insomnia-continue Ambien; stable.  # DISPOSITION: # follow up in 3 months; labs- cbc/cmp Dr.B

## 2018-08-03 NOTE — Progress Notes (Signed)
I connected with Rana Snare on 08/03/18 at 10:00 AM EDTby telephone and verified that I am speaking with the patient using 2 identifiers.  # LOCATION:  Patient:home Provider: office  I discussed the limitations, risks, security and privacy concerns of performing an evaluation and management service by telephone and the availability of in person appointments.  I also discussed with the patient that there may be a patient responsible charge related to the service.  The patient expressed understanding and agrees to proceed.  History of present illness:Natlie A Soh 38 y.o.  female with history of triple negative breast stage I.  Patient denies any nausea vomiting.  Denies any headaches.  Denies any bone pain.  Observation/objective: CBC CMP reviewed within normal limits.  Assessment and plan: Malignant neoplasm of upper-inner quadrant of right breast in female, estrogen receptor negative ([place]) # Stage I triple negative breast cancer; post neoadjuvant chemotherapy- 6 mm residual disease.  Adjuvant Xeloda-  S/p cycle #8 [finished May 03, 2018] .    #Clinically no evidence of recurrence.  Given the COVID-19 pandemic-we will push out the baseline CT scans in 3 months.  Will order CT scans at next visit.  # hand foot syndrome- G-1;sec to xeloda-resolved.  # Insomnia-continue Ambien; stable.  # DISPOSITION: # follow up in 3 months; labs- cbc/cmp Dr.B    Follow-up instructions:  I discussed the assessment and treatment plan with the patient.  The patient was provided an opportunity to ask questions and all were answered.  The patient agreed with the plan and demonstrated understanding of instructions.  The patient was advised to call back or seek an in person evaluation if the symptoms worsen or if the condition fails to improve as anticipated.  I provide 5 minutes of non-face-to-face time during this encounter   Dr. [name] [facility] at [facility] 08/03/2018 11:07 AM

## 2018-08-20 ENCOUNTER — Encounter: Payer: Self-pay | Admitting: Hematology and Oncology

## 2018-09-17 ENCOUNTER — Other Ambulatory Visit: Payer: Self-pay | Admitting: *Deleted

## 2018-09-18 MED ORDER — ZOLPIDEM TARTRATE 5 MG PO TABS: 5.0000 mg | ORAL_TABLET | Freq: Every evening | ORAL | 0 refills | Status: DC | PRN

## 2018-10-29 ENCOUNTER — Other Ambulatory Visit: Payer: Self-pay

## 2018-11-02 ENCOUNTER — Inpatient Hospital Stay: Attending: Internal Medicine

## 2018-11-02 ENCOUNTER — Inpatient Hospital Stay (HOSPITAL_BASED_OUTPATIENT_CLINIC_OR_DEPARTMENT_OTHER): Admitting: Internal Medicine

## 2018-11-02 ENCOUNTER — Other Ambulatory Visit: Payer: Self-pay

## 2018-11-02 ENCOUNTER — Encounter: Payer: Self-pay | Admitting: Internal Medicine

## 2018-11-02 ENCOUNTER — Other Ambulatory Visit: Payer: Self-pay | Admitting: *Deleted

## 2018-11-02 DIAGNOSIS — G4700 Insomnia, unspecified: Secondary | ICD-10-CM | POA: Insufficient documentation

## 2018-11-02 DIAGNOSIS — C50211 Malignant neoplasm of upper-inner quadrant of right female breast: Secondary | ICD-10-CM

## 2018-11-02 DIAGNOSIS — R635 Abnormal weight gain: Secondary | ICD-10-CM

## 2018-11-02 DIAGNOSIS — Z171 Estrogen receptor negative status [ER-]: Secondary | ICD-10-CM

## 2018-11-02 DIAGNOSIS — R5383 Other fatigue: Secondary | ICD-10-CM

## 2018-11-02 DIAGNOSIS — Z79899 Other long term (current) drug therapy: Secondary | ICD-10-CM | POA: Diagnosis not present

## 2018-11-02 DIAGNOSIS — Z87891 Personal history of nicotine dependence: Secondary | ICD-10-CM

## 2018-11-02 LAB — COMPREHENSIVE METABOLIC PANEL
ALT: 14 U/L (ref 0–44)
AST: 19 U/L (ref 15–41)
Albumin: 4.5 g/dL (ref 3.5–5.0)
Alkaline Phosphatase: 106 U/L (ref 38–126)
Anion gap: 11 (ref 5–15)
BUN: 20 mg/dL (ref 6–20)
CO2: 26 mmol/L (ref 22–32)
Calcium: 9.4 mg/dL (ref 8.9–10.3)
Chloride: 100 mmol/L (ref 98–111)
Creatinine, Ser: 0.67 mg/dL (ref 0.44–1.00)
GFR calc Af Amer: >60 mL/min (ref >60)
GFR calc non Af Amer: >60 mL/min (ref >60)
Glucose, Bld: 82 mg/dL (ref 70–99)
Potassium: 4.8 mmol/L (ref 3.5–5.1)
Sodium: 137 mmol/L (ref 135–145)
Total Bilirubin: 0.4 mg/dL (ref 0.3–1.2)
Total Protein: 7.8 g/dL (ref 6.5–8.1)

## 2018-11-02 LAB — CBC WITH DIFFERENTIAL/PLATELET
Abs Immature Granulocytes: 0.02 10*3/uL (ref 0.00–0.07)
Basophils Absolute: 0.1 10*3/uL (ref 0.0–0.1)
Basophils Relative: 1 %
Eosinophils Absolute: 0.4 10*3/uL (ref 0.0–0.5)
Eosinophils Relative: 5 %
HCT: 38.7 % (ref 36.0–46.0)
Hemoglobin: 12.9 g/dL (ref 12.0–15.0)
Immature Granulocytes: 0 %
Lymphocytes Relative: 24 %
Lymphs Abs: 1.7 10*3/uL (ref 0.7–4.0)
MCH: 31.5 pg (ref 26.0–34.0)
MCHC: 33.3 g/dL (ref 30.0–36.0)
MCV: 94.4 fL (ref 80.0–100.0)
Monocytes Absolute: 0.7 10*3/uL (ref 0.1–1.0)
Monocytes Relative: 10 %
Neutro Abs: 4.2 10*3/uL (ref 1.7–7.7)
Neutrophils Relative %: 60 %
Platelets: 228 10*3/uL (ref 150–400)
RBC: 4.1 MIL/uL (ref 3.87–5.11)
RDW: 12 % (ref 11.5–15.5)
WBC: 7 10*3/uL (ref 4.0–10.5)
nRBC: 0 % (ref 0.0–0.2)

## 2018-11-02 LAB — TSH: TSH: 1.149 u[IU]/mL (ref 0.350–4.500)

## 2018-11-02 NOTE — Assessment & Plan Note (Addendum)
#   Stage I triple negative breast cancer; post neoadjuvant chemotherapy- 6 mm residual disease.  Adjuvant Xeloda-  S/p cycle #8 [finished May 03, 2018].   #Clinically no evidence of disease noted.  We will plan baseline/posttreatment scan in 2 months.  CT scan has been ordered.  #Weight gain likely second to postmenopausal status/we will check a TSH.  # Insomnia-continue Ambien; stable.   # DISPOSITION: add TSH today # follow up in 2 months; MD-; no labs; CT C/A/P prior- Dr.B

## 2018-11-02 NOTE — Progress Notes (Signed)
Here for follow up, feels tired, weight is fluctuating

## 2018-11-02 NOTE — Progress Notes (Signed)
[facility] OFFICE PROGRESS NOTE  Patient Care Team: Serita Grammes, MD as PCP - General (Family Medicine) Magrinat, Virgie Dad, MD as Consulting Physician (Oncology) Marga Hoots, MD (Obstetrics and Gynecology) Dillingham, Loel Lofty, DO as Attending Physician (Plastic Surgery) Coralie Keens, MD as Consulting Physician (General Surgery) Cammie Sickle, MD as Consulting Physician (Internal Medicine) Rico Junker, RN as Oncology Nurse Navigator Jovita Kussmaul, MD as Consulting Physician (General Surgery)  Cancer Staging No matching staging information was found for the patient.   Oncology History Overview Note  # OCT 2018- RIGHT BREAST UIQ -[place]; TRIPLE NEGATIVE; ki-67-high [58m-1'O clock-Bx- proven; [place]]; another- 12'O clock-428m Not biopsied. [s/p Dr.Magrinaut; [place]]; cT1c(m) cN0;  G-3; ki-67- 80%  # 11/218- ddACesolved;  x4 cyles- US/ammo- improved 31m10mothe leison-r-Taxol+carbo [finished ealry April 2019]  # MAY 2019- s/p Mastec& SLNBx [Dr.Toth; [place]]; 6mm70msidual ca; 3 SLN-NEG [ypT1bsnN0]; s/p 8 cycles of xeloda [finished jan 5th 2020]  # BRCA-1 Gene mutated [mom-ovarian cancer-Stage IIIB/Brca; older sister- breast ca/brca-mutated]  # MUGA scan [nov [id number]]%; UPBEAT clinical trial; port explanted.   # TAH &BSO [s/p Oct 29th/Dr.Pearson; breast recon reconstruction- dec 3rd] --------------------------------------------------------   DIAGNOSIS: TRIPLE NEGATIVE BREAST CA  STAGE:  I   ; GOALS: CURATIVE  CURRENT/MOST RECENT THERAPY: surveillaince    Malignant neoplasm of upper-inner quadrant of right breast in female, estrogen receptor negative (HCC)[place]   INTERVAL HISTORY:  H[place] AMISONy45.  female pleasant patient above history of stage I triple negative breast cancer currently on surveillance is here for follow-up.  Patient finished adjuvant Xeloda in January 2020.  Patient denies any new lumps or bumps.  Appetite is  good.  No nausea no vomiting no headaches.  No skin rash.  Patient does complain of increasing weight gain.  Mild to moderate fatigue.  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  She is nervous going back to school given the coronavirus pandemic  Review of Systems  Constitutional: Negative for chills, diaphoresis, fever, malaise/fatigue and weight loss.  HENT: Negative for nosebleeds and sore throat.   Eyes: Negative for double vision.  Respiratory: Negative for cough, hemoptysis, sputum production, shortness of breath and wheezing.   Cardiovascular: Negative for chest pain, palpitations, orthopnea and leg swelling.  Gastrointestinal: Negative for abdominal pain, blood in stool, constipation, diarrhea, heartburn, melena, nausea and vomiting.  Genitourinary: Negative for dysuria, frequency and urgency.  Musculoskeletal: Negative for back pain and joint pain.  Neurological: Negative for dizziness, tingling, focal weakness, weakness and headaches.  Endo/Heme/Allergies: Does not bruise/bleed easily.  Psychiatric/Behavioral: Negative for depression. The patient is not nervous/anxious and does not have insomnia.       PAST MEDICAL HISTORY :  Past Medical History:  Diagnosis Date  . Anemia    iron deficiency during chemotherapy  . Anxiety   . Asthma    Excercise induced as a child  . Fibromyalgia   . Headache    migraine headaches due to sinus/allergies and teeth grinding at night  . Malignant neoplasm of upper-inner quadrant of right breast in female, estrogen receptor negative (HCC)[place]18   right breast  . Personal history of chemotherapy    Right breast- 4 treatments    PAST SURGICAL HISTORY :   Past Surgical History:  Procedure Laterality Date  . ABDOMINAL HYSTERECTOMY    . BREAST RECONSTRUCTION WITH PLACEMENT OF TISSUE EXPANDER AND FLEX HD (ACELLULAR HYDRATED DERMIS) Bilateral 09/15/2017  Procedure: BREAST RECONSTRUCTION WITH PLACEMENT OF TISSUE EXPANDER  AND FLEX HD (ACELLULAR HYDRATED DERMIS);  Surgeon: Wallace Going, DO;  Location: [facility];  Service: Plastics;  Laterality: Bilateral;  . IR IMAGING GUIDED PORT INSERTION  03/17/2017  . KNEE SURGERY Left 2009   Torn meniscus   . NIPPLE SPARING MASTECTOMY/SENTINAL LYMPH NODE BIOPSY/RECONSTRUCTION/PLACEMENT OF TISSUE EXPANDER Bilateral 09/15/2017   Procedure: BILATERAL NIPPLE SPARING MASTECTOMIES  WITH RIGHT  SENTINAL LYMPH NODE BIOPSY;  Surgeon: Jovita Kussmaul, MD;  Location: [facility];  Service: General;  Laterality: Bilateral;  . PORT-A-CATH REMOVAL N/A 09/15/2017   Procedure: REMOVAL PORT-A-CATH;  Surgeon: Jovita Kussmaul, MD;  Location: [facility];  Service: General;  Laterality: N/A;  . REMOVAL OF BILATERAL TISSUE EXPANDERS WITH PLACEMENT OF BILATERAL BREAST IMPLANTS Bilateral 04/01/2018   Procedure: REMOVAL OF BILATERAL TISSUE EXPANDERS WITH PLACEMENT OF BILATERAL BREAST IMPLANTS;  Surgeon: Wallace Going, DO;  Location: [facility];  Service: Plastics;  Laterality: Bilateral;  . RHINOPLASTY  [id number]   both surgeries due to broken nose    FAMILY HISTORY :   Family History  Problem Relation Age of Onset  . Cancer Mother        Ovarian cancer (2003), breast cancer(2017) & squamous cell   . Leukemia Mother   . Breast cancer Mother 28  . Cancer Sister        breast cancer  . Breast cancer Sister 58  . Melanoma Father   . Lung cancer Paternal Grandmother     SOCIAL HISTORY:   Social History   Tobacco Use  . Smoking status: Former Smoker    Packs/day: 0.25    Types: Cigarettes    Quit date: 09/16/2011    Years since quitting: 7.1  . Smokeless tobacco: Never Used  Substance Use Topics  . Alcohol use: No  . Drug use: No    ALLERGIES:  has No Known Allergies.  MEDICATIONS:  Current Outpatient Medications  Medication Sig Dispense Refill  . acetaminophen (TYLENOL) 325 MG tablet Take 1 tablet (325 mg total) by mouth every 6 (six) hours.    Marland Kitchen loratadine (CLARITIN) 10 MG  tablet Take 10 mg daily by mouth.    . Multiple Vitamin (MULTIVITAMIN WITH MINERALS) TABS tablet Take 1 tablet by mouth daily. One-A-Day    . naproxen (NAPROSYN) 500 MG tablet Take 1 tablet (500 mg total) by mouth 2 (two) times daily as needed for mild pain.    . pseudoephedrine-acetaminophen (TYLENOL SINUS) 30-500 MG TABS tablet Take 2 tablets by mouth daily.     Marland Kitchen senna (SENOKOT) 8.6 MG TABS tablet Take 1 tablet (8.6 mg total) by mouth 2 (two) times daily. 120 each 0  . sertraline (ZOLOFT) 100 MG tablet Take 1 tablet (100 mg total) by mouth at bedtime. 30 tablet 4  . capecitabine (XELODA) 150 MG tablet TAKE 2 TABLETS (300 MG TOTAL) BY MOUTH 2 TIMES DAILY AFTER A MEAL. TAKE FOR 14 DAYS ON, THEN 7 DAYS OFF. TAKE WITH THREE 500MG TABLETS (Patient not taking: Reported on 11/02/2018) 56 tablet 5  . capecitabine (XELODA) 500 MG tablet TAKE 3 TABLETS (1,500 MG TOTAL) BY MOUTH 2 (TWO) TIMES DAILY AFTER A MEAL. TAKE FOR 14 DAYS ON, THEN 7 DAYS OFF. TAKE WITH TWO 150MG TABLETS (Patient not taking: Reported on 11/02/2018) 84 tablet 5  . cromolyn (NASALCROM) 5.2 MG/ACT nasal spray Place 1 spray into both nostrils 4 (four) times daily as needed for allergies or rhinitis. (Patient not taking: Reported on  11/02/2018) 26 mL 0  . polyethylene glycol (MIRALAX / GLYCOLAX) packet Take 17 g by mouth daily as needed for mild constipation. (Patient not taking: Reported on 11/02/2018) 14 each 0  . triamcinolone ointment (KENALOG) 0.5 % Apply 1 application topically 2 (two) times daily. (Patient not taking: Reported on 11/02/2018) 30 g 0  . urea (CARMOL) 10 % cream Apply topically as needed. (Patient not taking: Reported on 11/02/2018) 71 g 0  . zolpidem (AMBIEN) 5 MG tablet Take 1 tablet (5 mg total) by mouth at bedtime as needed for sleep. (Patient not taking: Reported on 11/02/2018) 60 tablet 0   No current facility-administered medications for this visit.     PHYSICAL EXAMINATION: ECOG PERFORMANCE STATUS: 0 - Asymptomatic  BP  102/68 (BP Location: Left Arm, Patient Position: Sitting)   Temp (!) 96.2 F (35.7 C) (Tympanic)   Resp 18   Wt 156 lb 12.8 oz (71.1 kg)   LMP 05/28/2017 (Approximate) Comment: no periods since start of chemo  BMI 24.56 kg/m   Filed Weights   11/02/18 1033  Weight: 156 lb 12.8 oz (71.1 kg)    Physical Exam  Constitutional: She is oriented to person, place, and time and well-developed, well-nourished, and in no distress.  Is alone.  HENT:  Head: Normocephalic and atraumatic.  Mouth/Throat: Oropharynx is clear and moist. No oropharyngeal exudate.  Eyes: Pupils are equal, round, and reactive to light.  Neck: Normal range of motion. Neck supple.  Cardiovascular: Normal rate and regular rhythm.  Pulmonary/Chest: No respiratory distress. She has no wheezes.  Abdominal: Soft. Bowel sounds are normal. She exhibits no distension and no mass. There is no abdominal tenderness. There is no rebound and no guarding.  Musculoskeletal: Normal range of motion.        General: No tenderness or edema.  Neurological: She is alert and oriented to person, place, and time.  Skin: Skin is warm.  Mild desquamation of hands.  Psychiatric: Affect normal.      LABORATORY DATA:  I have reviewed the data as listed    Component Value Date/Time   NA 137 11/02/2018 1012   K 4.8 11/02/2018 1012   CL 100 11/02/2018 1012   CO2 26 11/02/2018 1012   GLUCOSE 82 11/02/2018 1012   BUN 20 11/02/2018 1012   CREATININE 0.67 11/02/2018 1012   CALCIUM 9.4 11/02/2018 1012   PROT 7.8 11/02/2018 1012   ALBUMIN 4.5 11/02/2018 1012   AST 19 11/02/2018 1012   ALT 14 11/02/2018 1012   ALKPHOS 106 11/02/2018 1012   BILITOT 0.4 11/02/2018 1012   GFRNONAA >60 11/02/2018 1012   GFRAA >60 11/02/2018 1012    No results found for: SPEP, UPEP  Lab Results  Component Value Date   WBC 7.0 11/02/2018   NEUTROABS 4.2 11/02/2018   HGB 12.9 11/02/2018   HCT 38.7 11/02/2018   MCV 94.4 11/02/2018   PLT 228 11/02/2018       Chemistry      Component Value Date/Time   NA 137 11/02/2018 1012   K 4.8 11/02/2018 1012   CL 100 11/02/2018 1012   CO2 26 11/02/2018 1012   BUN 20 11/02/2018 1012   CREATININE 0.67 11/02/2018 1012      Component Value Date/Time   CALCIUM 9.4 11/02/2018 1012   ALKPHOS 106 11/02/2018 1012   AST 19 11/02/2018 1012   ALT 14 11/02/2018 1012   BILITOT 0.4 11/02/2018 1012       RADIOGRAPHIC STUDIES: I have  personally reviewed the radiological images as listed and agreed with the findings in the report. No results found.   ASSESSMENT & PLAN:  Malignant neoplasm of upper-inner quadrant of right breast in female, estrogen receptor negative ([place]) # Stage I triple negative breast cancer; post neoadjuvant chemotherapy- 6 mm residual disease.  Adjuvant Xeloda-  S/p cycle #8 [finished May 03, 2018].   #Clinically no evidence of disease noted.  We will plan baseline/posttreatment scan in 2 months.  CT scan has been ordered.  #Weight gain likely second to postmenopausal status/we will check a TSH.  # Insomnia-continue Ambien; stable.   # DISPOSITION: add TSH today # follow up in 2 months; MD-; no labs; CT C/A/P prior- Dr.B   Orders Placed This Encounter  Procedures  . CT ABDOMEN PELVIS W CONTRAST    Standing Status:   Future    Standing Expiration Date:   11/02/2019    Order Specific Question:   If indicated for the ordered procedure, I authorize the administration of contrast media per Radiology protocol    Answer:   Yes    Order Specific Question:   Preferred imaging location?    Answer:   [facility]    Order Specific Question:   Radiology Contrast Protocol - do NOT remove file path    Answer:   [email or web address]    Order Specific Question:   ** REASON FOR EXAM (FREE TEXT)    Answer:   breast cancer    Order Specific Question:   Is patient pregnant?    Answer:   No  . CT CHEST W CONTRAST    Standing Status:   Future    Standing  Expiration Date:   11/02/2019    Order Specific Question:   If indicated for the ordered procedure, I authorize the administration of contrast media per Radiology protocol    Answer:   Yes    Order Specific Question:   Preferred imaging location?    Answer:   [facility]    Order Specific Question:   Radiology Contrast Protocol - do NOT remove file path    Answer:   [email or web address]    Order Specific Question:   ** REASON FOR EXAM (FREE TEXT)    Answer:   breast cancer    Order Specific Question:   Is patient pregnant?    Answer:   No  . TSH    Standing Status:   Future    Number of Occurrences:   1    Standing Expiration Date:   11/02/2019   All questions were answered. The patient knows to call the clinic with any problems, questions or concerns.      Cammie Sickle, MD 11/02/2018 1:49 PM

## 2018-11-07 IMAGING — NM NM CARDIA MUGA REST
6 series · 38 of 38 positions shown · non-contrast
Comparison: None

CLINICAL DATA: RIGHT breast cancer, pre cardiotoxic chemotherapy

EXAM:
NUCLEAR MEDICINE CARDIAC BLOOD POOL IMAGING (MUGA)
TECHNIQUE: Cardiac multi-gated acquisition was performed at rest following
intravenous injection of Jc-33m labeled red blood cells.
RADIOPHARMACEUTICALS:  23.81 mCi Jc-33m pertechnetate UltraTag
in-vitro labeled autologous red blood cells IV

[Series 1000: 70 degree-gated · 3.30mm/px · 6 of 24 frames shown]
[frame 3/24]
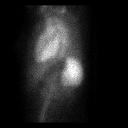
[frame 7/24]
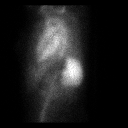
[frame 11/24]
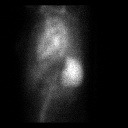
[frame 15/24]
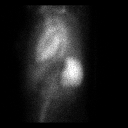
[frame 19/24]
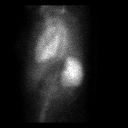
[frame 23/24]
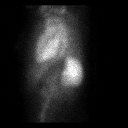

[Series 1000: 45 lao-gated (original with roi) · 3.30mm/px · 6 of 24 frames shown]
[frame 3/24]
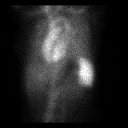
[frame 7/24]
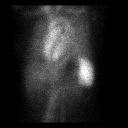
[frame 11/24]
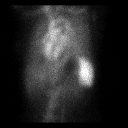
[frame 15/24]
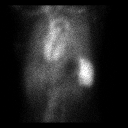
[frame 19/24]
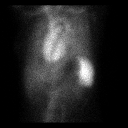
[frame 23/24]
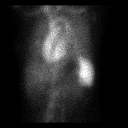

[Series 1000: 45 lao-gated · 3.30mm/px · 6 of 24 frames shown]
[frame 3/24]
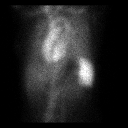
[frame 7/24]
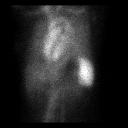
[frame 11/24]
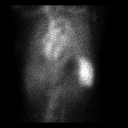
[frame 15/24]
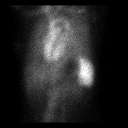
[frame 19/24]
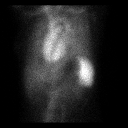
[frame 23/24]
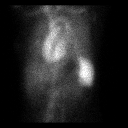

[Series 1000: 45 lao-gated (results) · 3.30mm/px · 6 of 24 frames shown]
[frame 3/24]
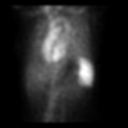
[frame 7/24]
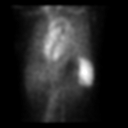
[frame 11/24]
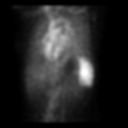
[frame 15/24]
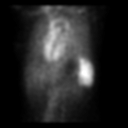
[frame 19/24]
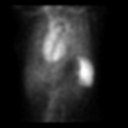
[frame 23/24]
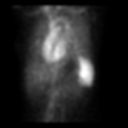

[Series 1000: anterior-gated · 3.30mm/px · 6 of 24 frames shown]
[frame 3/24]
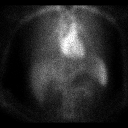
[frame 7/24]
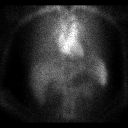
[frame 11/24]
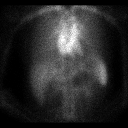
[frame 15/24]
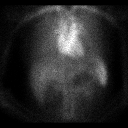
[frame 19/24]
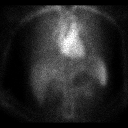
[frame 23/24]
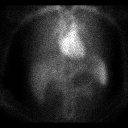

[Series 1000: 45 lao-gated (functional) · 3.30mm/px · 8 of 8 slices shown]
[im 1/8]
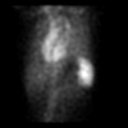
[im 2/8]
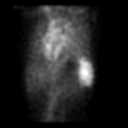
[im 3/8]
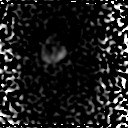
[im 4/8  full-range]
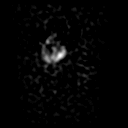
[im 5/8  full-range]
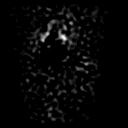
[im 6/8  full-range]
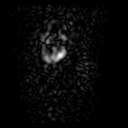
[im 7/8]
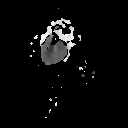
[im 8/8]
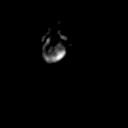

[38 of 38 positions shown; findings below may reference images not displayed]

FINDINGS: Calculated LEFT ventricular ejection fraction is 69% which is within
the normal range.

Patient was rhythmic during image acquisition.

No focal wall motion abnormalities.
IMPRESSION: Calculated LEFT ventricular ejection fraction of 69%.

Normal LEFT ventricular wall motion.

## 2018-11-18 IMAGING — XA IR FLUORO GUIDE CV LINE*L*
1 series · 1 of 1 positions shown · non-contrast
Comparison: none

CLINICAL DATA: Invasive ductal carcinoma of the right breast and
need for porta cath to begin chemotherapy.

[Series 1: fl - angio · 1 of 1 slices shown]
[im 1/1]
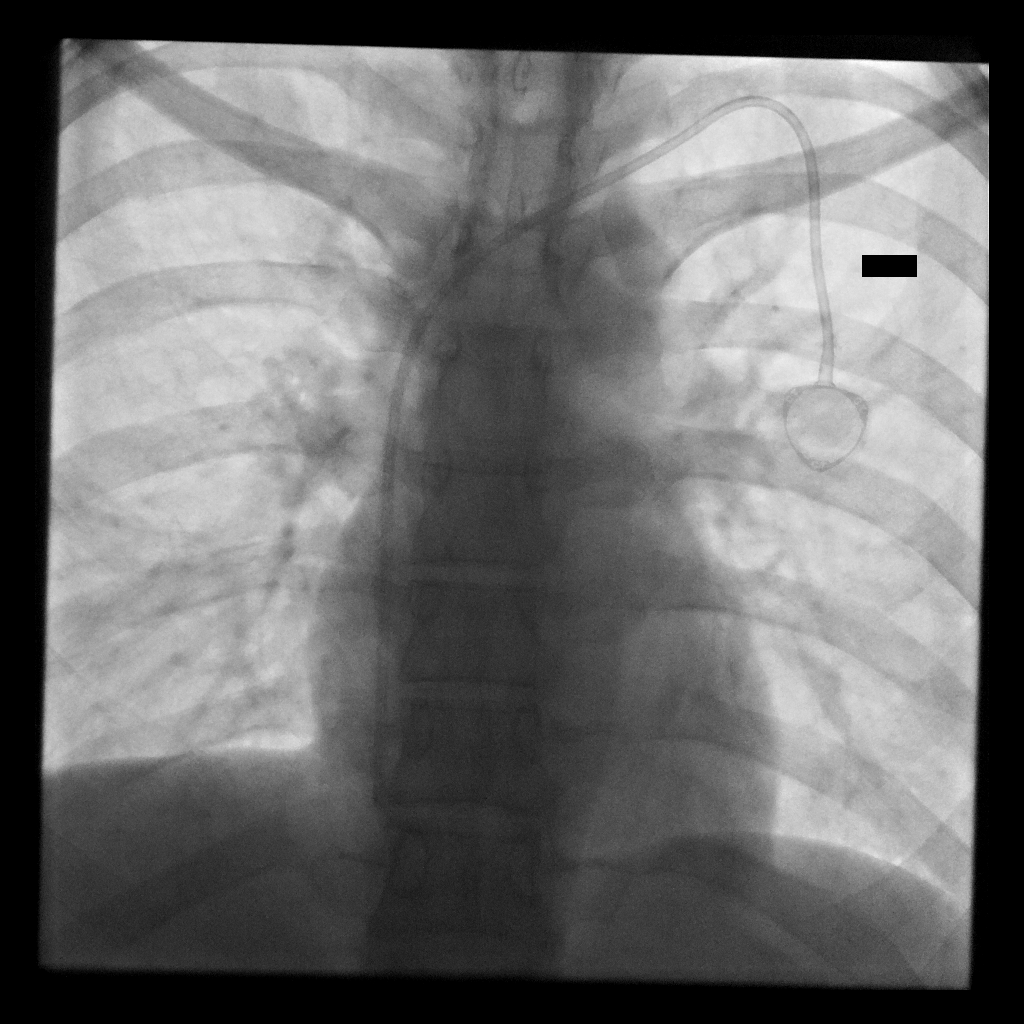

[1 of 1 positions shown; findings below may reference images not displayed]

EXAM:
IMPLANTED PORT A CATH PLACEMENT WITH ULTRASOUND AND FLUOROSCOPIC
GUIDANCE

ANESTHESIA/SEDATION:
4.0 mg IV Versed; 100 mcg IV Fentanyl

Total Moderate Sedation Time:  32 minutes

The patient's level of consciousness and physiologic status were
continuously monitored during the procedure by Radiology nursing.

Additional Medications: 2 g IV Ancef.

FLUOROSCOPY TIME:  24 seconds.  2.0 mGy.

PROCEDURE:
The procedure, risks, benefits, and alternatives were explained to
the patient. Questions regarding the procedure were encouraged and
answered. The patient understands and consents to the procedure. A
time-out was performed prior to initiating the procedure.

Ultrasound was utilized to confirm patency of the left internal
jugular vein. The left neck and chest were prepped with
chlorhexidine in a sterile fashion, and a sterile drape was applied
covering the operative field. Maximum barrier sterile technique with
sterile gowns and gloves were used for the procedure. Local
anesthesia was provided with 1% lidocaine.

After creating a small venotomy incision, a 21 gauge needle was
advanced into the left internal jugular vein under direct, real-time
ultrasound guidance. Ultrasound image documentation was performed.
After securing guidewire access, an 8 Fr dilator was placed. A
J-wire was kinked to measure appropriate catheter length.

A subcutaneous port pocket was then created along the upper chest
wall utilizing sharp and blunt dissection. Portable cautery was
utilized. The pocket was irrigated with sterile saline.

A single lumen power injectable port was chosen for placement. The 8
Fr catheter was tunneled from the port pocket site to the venotomy
incision. The port was placed in the pocket. External catheter was
trimmed to appropriate length based on guidewire measurement.

At the venotomy, an 8 Fr peel-away sheath was placed over a
guidewire. The catheter was then placed through the sheath and the
sheath removed. Final catheter positioning was confirmed and
documented with a fluoroscopic spot image. The port was accessed
with a needle and aspirated and flushed with heparinized saline. The
access needle was removed.

The venotomy and port pocket incisions were closed with subcutaneous
3-0 Monocryl and subcuticular 4-0 Vicryl. Dermabond was applied to
both incisions.

COMPLICATIONS:
COMPLICATIONS
None
FINDINGS: After catheter placement, the tip lies at the [place] junction.
The catheter aspirates normally and is ready for immediate use.
IMPRESSION: Placement of single lumen port a cath via left internal jugular
vein. The catheter tip lies at the [place] junction. A power
injectable port a cath was placed and is ready for immediate use.

## 2018-12-31 ENCOUNTER — Ambulatory Visit

## 2019-01-05 ENCOUNTER — Ambulatory Visit: Admitting: Internal Medicine

## 2019-01-06 ENCOUNTER — Ambulatory Visit
Admission: RE | Admit: 2019-01-06 | Discharge: 2019-01-06 | Disposition: A | Discharge status: Home / Self Care | Source: Ambulatory Visit | Attending: Internal Medicine | Admitting: Internal Medicine

## 2019-01-06 ENCOUNTER — Other Ambulatory Visit: Payer: Self-pay

## 2019-01-06 DIAGNOSIS — C50211 Malignant neoplasm of upper-inner quadrant of right female breast: Secondary | ICD-10-CM | POA: Diagnosis not present

## 2019-01-06 DIAGNOSIS — Z171 Estrogen receptor negative status [ER-]: Secondary | ICD-10-CM

## 2019-01-06 MED ORDER — IOHEXOL 300 MG/ML  SOLN
100.0000 mL | Freq: Once | INTRAMUSCULAR | Status: AC | PRN
  Administered 2019-01-06: 14:00:00 100 mL via INTRAVENOUS

## 2019-01-13 ENCOUNTER — Inpatient Hospital Stay

## 2019-01-13 ENCOUNTER — Inpatient Hospital Stay: Attending: Internal Medicine | Admitting: Internal Medicine

## 2019-01-13 ENCOUNTER — Other Ambulatory Visit: Payer: Self-pay

## 2019-01-13 DIAGNOSIS — G4700 Insomnia, unspecified: Secondary | ICD-10-CM | POA: Diagnosis not present

## 2019-01-13 DIAGNOSIS — Z803 Family history of malignant neoplasm of breast: Secondary | ICD-10-CM | POA: Diagnosis not present

## 2019-01-13 DIAGNOSIS — Z23 Encounter for immunization: Secondary | ICD-10-CM

## 2019-01-13 DIAGNOSIS — Z87891 Personal history of nicotine dependence: Secondary | ICD-10-CM | POA: Diagnosis not present

## 2019-01-13 DIAGNOSIS — Z79899 Other long term (current) drug therapy: Secondary | ICD-10-CM | POA: Insufficient documentation

## 2019-01-13 DIAGNOSIS — C50211 Malignant neoplasm of upper-inner quadrant of right female breast: Secondary | ICD-10-CM | POA: Diagnosis present

## 2019-01-13 DIAGNOSIS — R634 Abnormal weight loss: Secondary | ICD-10-CM | POA: Insufficient documentation

## 2019-01-13 DIAGNOSIS — Z171 Estrogen receptor negative status [ER-]: Secondary | ICD-10-CM | POA: Insufficient documentation

## 2019-01-13 DIAGNOSIS — Z8041 Family history of malignant neoplasm of ovary: Secondary | ICD-10-CM | POA: Diagnosis not present

## 2019-01-13 DIAGNOSIS — Z801 Family history of malignant neoplasm of trachea, bronchus and lung: Secondary | ICD-10-CM | POA: Insufficient documentation

## 2019-01-13 DIAGNOSIS — R110 Nausea: Secondary | ICD-10-CM | POA: Diagnosis not present

## 2019-01-13 DIAGNOSIS — Z808 Family history of malignant neoplasm of other organs or systems: Secondary | ICD-10-CM | POA: Diagnosis not present

## 2019-01-13 DIAGNOSIS — Z806 Family history of leukemia: Secondary | ICD-10-CM | POA: Diagnosis not present

## 2019-01-13 IMAGING — CR DG CHEST 2V
1 series · 2 of 2 positions shown · non-contrast
Comparison: 04/23/2017

CLINICAL DATA: Cough for 4 days, fever for 2 days, history breast
cancer, most recent chemotherapy 2 weeks ago

EXAM:
CHEST  2 VIEW

[Series 3: w chest pa · 0.14mm/px · 2 of 2 slices shown]
[im 1/2]
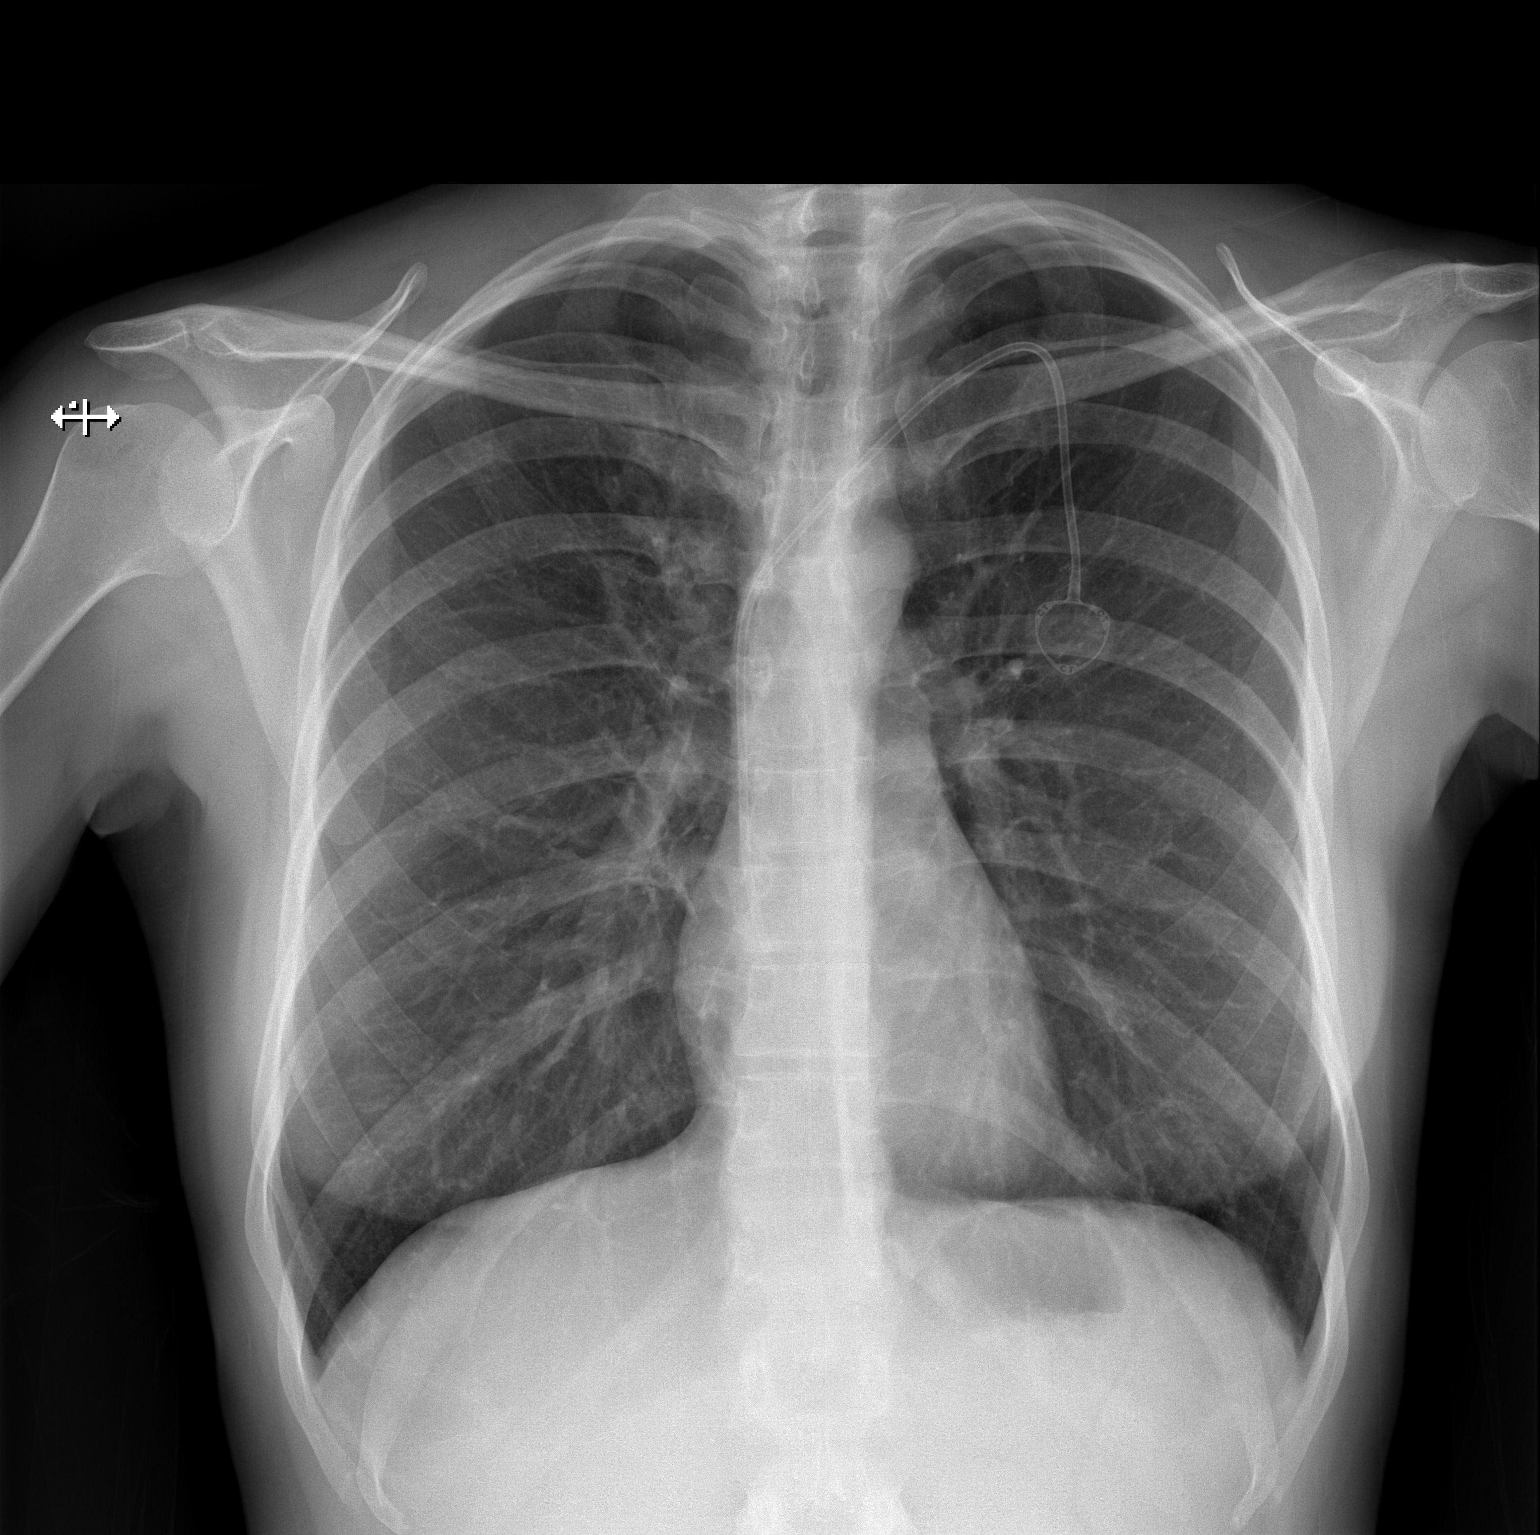
[im 2/2]
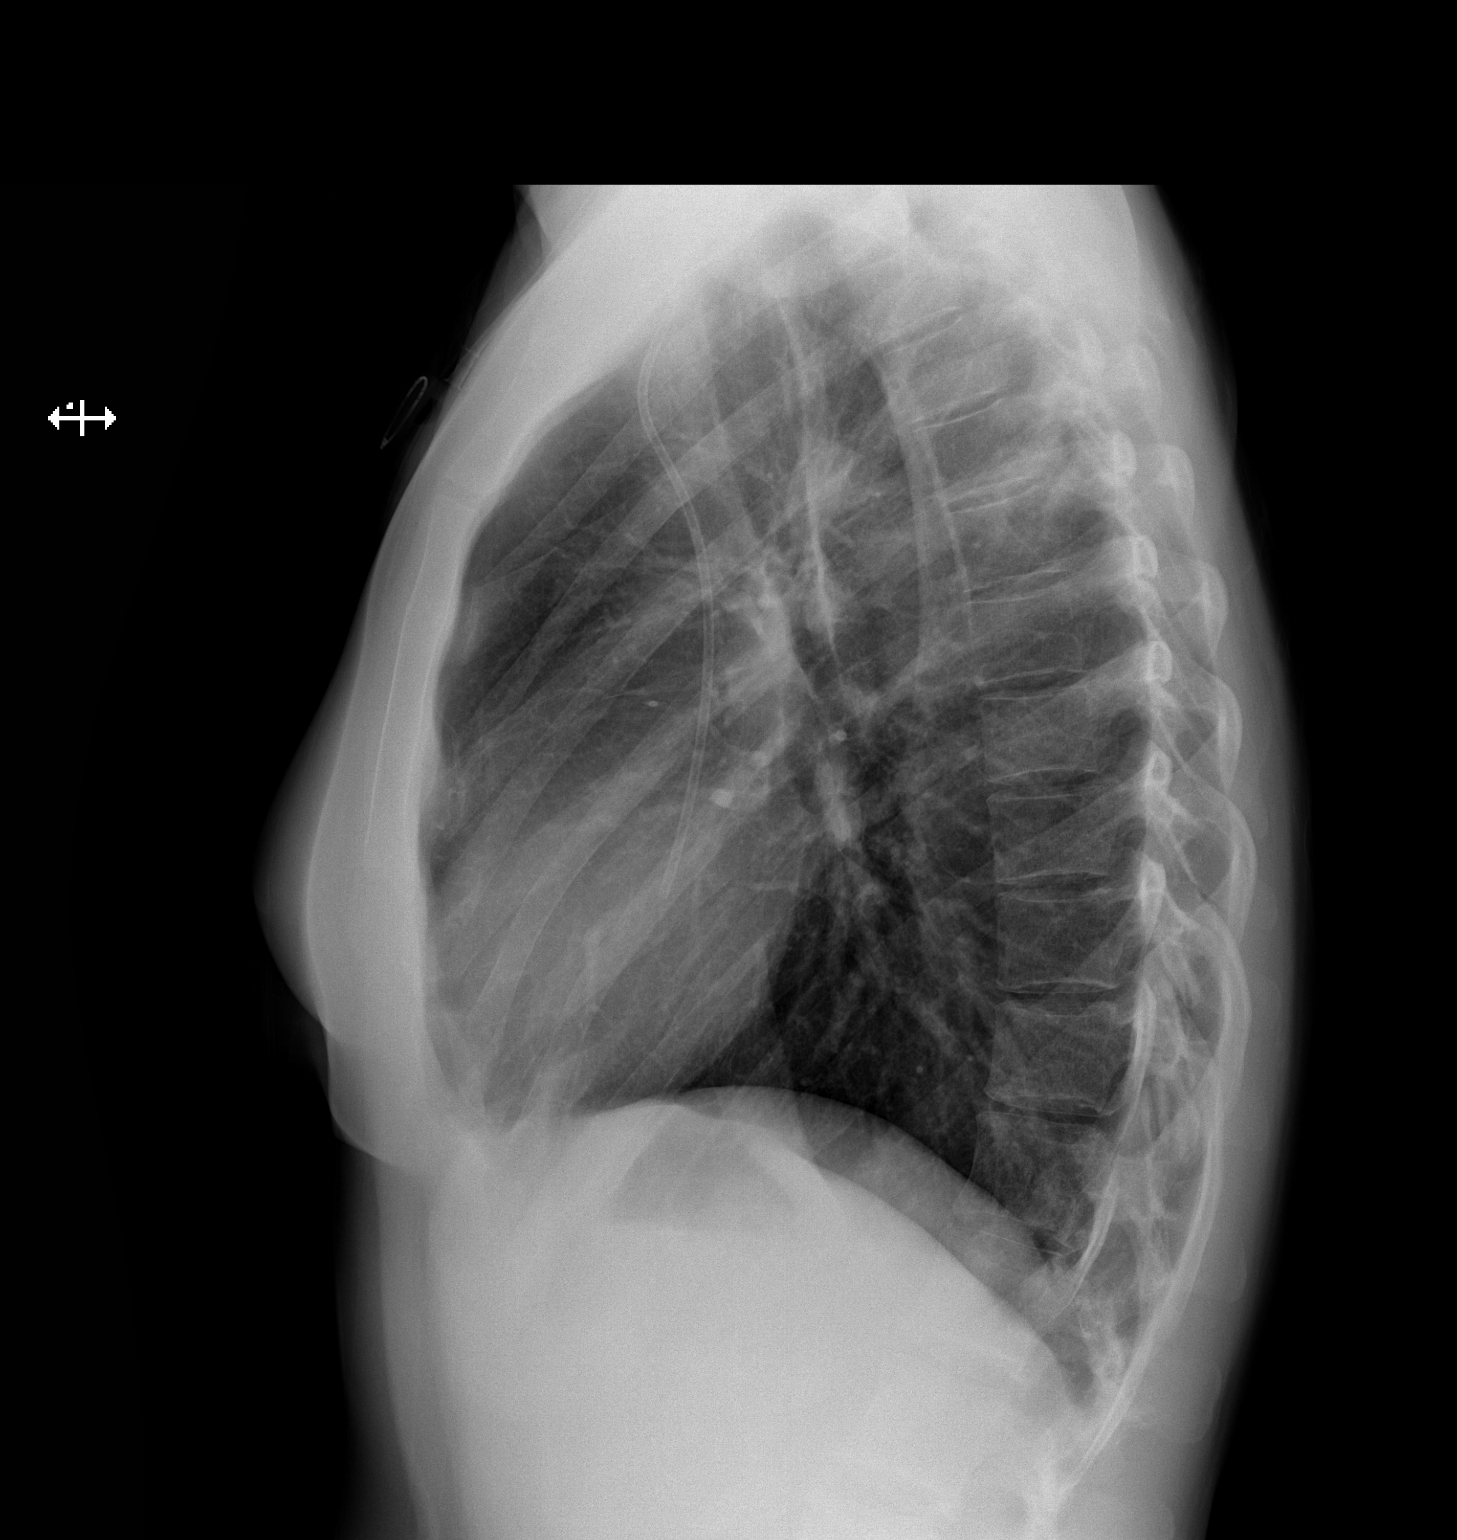

[2 of 2 positions shown; findings below may reference images not displayed]

FINDINGS: LEFT jugular Port-A-Cath with tip projecting over cavoatrial
junction.

Normal heart size, mediastinal contours, and pulmonary vascularity.

Lungs hyperinflated but clear.

No pleural effusion or pneumothorax.

Bones unremarkable.
IMPRESSION: No acute abnormalities.

## 2019-01-13 MED ORDER — INFLUENZA VAC SPLIT QUAD 0.5 ML IM SUSY
0.5000 mL | PREFILLED_SYRINGE | INTRAMUSCULAR | Status: AC
  Administered 2019-01-13: 0.5 mL via INTRAMUSCULAR
  Filled 2019-01-13: qty 0.5

## 2019-01-13 NOTE — Progress Notes (Signed)
[facility] OFFICE PROGRESS NOTE  Patient Care Team: Serita Grammes, MD as PCP - General (Family Medicine) Magrinat, Virgie Dad, MD as Consulting Physician (Oncology) Marga Hoots, MD (Obstetrics and Gynecology) Dillingham, Loel Lofty, DO as Attending Physician (Plastic Surgery) Coralie Keens, MD as Consulting Physician (General Surgery) Cammie Sickle, MD as Consulting Physician (Internal Medicine) Rico Junker, RN as Oncology Nurse Navigator Jovita Kussmaul, MD as Consulting Physician (General Surgery)  Cancer Staging No matching staging information was found for the patient.   Oncology History Overview Note  # OCT 2018- RIGHT BREAST UIQ -[place]; TRIPLE NEGATIVE; ki-67-high [50m-1'O clock-Bx- proven; [place]]; another- 12'O clock-459m Not biopsied. [s/p Dr.Magrinaut; [place]]; cT1c(m) cN0;  G-3; ki-67- 80%  # 11/218- ddACesolved;  x4 cyles- US/ammo- improved 63m28mothe leison-r-Taxol+carbo [finished ealry April 2019]  # MAY 2019- s/p Mastec& SLNBx [Dr.Toth; [place]]; 6mm35msidual ca; 3 SLN-NEG [ypT1bsnN0]; s/p 8 cycles of xeloda [finished jan 5th 2020]  # BRCA-1 Gene mutated [mom-ovarian cancer-Stage IIIB/Brca; older sister- breast ca/brca-mutated]  # MUGA scan [nov [id number]]%; UPBEAT clinical trial; port explanted.   # TAH &BSO [s/p Oct 29th/Dr.Pearson; breast recon reconstruction- dec 3rd] --------------------------------------------------------   DIAGNOSIS: TRIPLE NEGATIVE BREAST CA  STAGE:  I   ; GOALS: CURATIVE  CURRENT/MOST RECENT THERAPY: surveillaince    Malignant neoplasm of upper-inner quadrant of right breast in female, estrogen receptor negative (HCC)[place]   INTERVAL HISTORY:  Holly [age]CCIy47.  female pleasant patient above history of stage I triple negative breast cancer currently on surveillance is here for follow-up/review the results of the baseline CT scan.  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  Obviously concerned about COVID exposure.  Not had any symptoms of cold infection.  No lumps or bumps.  Appetite is good.  She has been trying to lose weight.  Nausea no vomiting.  Review of Systems  Constitutional: Negative for chills, diaphoresis, fever, malaise/fatigue and weight loss.  HENT: Negative for nosebleeds and sore throat.   Eyes: Negative for double vision.  Respiratory: Negative for cough, hemoptysis, sputum production, shortness of breath and wheezing.   Cardiovascular: Negative for chest pain, palpitations, orthopnea and leg swelling.  Gastrointestinal: Negative for abdominal pain, blood in stool, constipation, diarrhea, heartburn, melena, nausea and vomiting.  Genitourinary: Negative for dysuria, frequency and urgency.  Musculoskeletal: Negative for back pain and joint pain.  Neurological: Negative for dizziness, tingling, focal weakness, weakness and headaches.  Endo/Heme/Allergies: Does not bruise/bleed easily.  Psychiatric/Behavioral: Negative for depression. The patient is not nervous/anxious and does not have insomnia.       PAST MEDICAL HISTORY :  Past Medical History:  Diagnosis Date  . Anemia    iron deficiency during chemotherapy  . Anxiety   . Asthma    Excercise induced as a child  . Fibromyalgia   . Headache    migraine headaches due to sinus/allergies and teeth grinding at night  . Malignant neoplasm of upper-inner quadrant of right breast in female, estrogen receptor negative (HCC)[place]18   right breast  . Personal history of chemotherapy    Right breast- 4 treatments    PAST SURGICAL HISTORY :   Past Surgical History:  Procedure Laterality Date  . ABDOMINAL HYSTERECTOMY    . BREAST RECONSTRUCTION WITH PLACEMENT OF TISSUE EXPANDER AND FLEX HD (ACELLULAR HYDRATED DERMIS) Bilateral 09/15/2017   Procedure: BREAST RECONSTRUCTION WITH PLACEMENT OF TISSUE EXPANDER AND  FLEX HD (ACELLULAR HYDRATED DERMIS);  Surgeon: Wallace Going, DO;  Location: [facility] ORS;  Service: Plastics;  Laterality: Bilateral;  . IR IMAGING GUIDED PORT INSERTION  03/17/2017  . KNEE SURGERY Left 2009   Torn meniscus   . NIPPLE SPARING MASTECTOMY/SENTINAL LYMPH NODE BIOPSY/RECONSTRUCTION/PLACEMENT OF TISSUE EXPANDER Bilateral 09/15/2017   Procedure: BILATERAL NIPPLE SPARING MASTECTOMIES  WITH RIGHT  SENTINAL LYMPH NODE BIOPSY;  Surgeon: Jovita Kussmaul, MD;  Location: [facility] OR[facility];  Service: General;  Laterality: Bilateral;  . PORT-A-CATH REMOVAL N/A 09/15/2017   Procedure: REMOVAL PORT-A-CATH;  Surgeon: Jovita Kussmaul, MD;  Location: [facility] OR[facility];  Service: General;  Laterality: N/A;  . REMOVAL OF BILATERAL TISSUE EXPANDERS WITH PLACEMENT OF BILATERAL BREAST IMPLANTS Bilateral 04/01/2018   Procedure: REMOVAL OF BILATERAL TISSUE EXPANDERS WITH PLACEMENT OF BILATERAL BREAST IMPLANTS;  Surgeon: Wallace Going, DO;  Location: [facility] ORS;  Service: Plastics;  Laterality: Bilateral;  . RHINOPLASTY  [id number]   both surgeries due to broken nose    FAMILY HISTORY :   Family History  Problem Relation Age of Onset  . Cancer Mother        Ovarian cancer (2003), breast cancer(2017) & squamous cell   . Leukemia Mother   . Breast cancer Mother 25  . Cancer Sister        breast cancer  . Breast cancer Sister 68  . Melanoma Father   . Lung cancer Paternal Grandmother     SOCIAL HISTORY:   Social History   Tobacco Use  . Smoking status: Former Smoker    Packs/day: 0.25    Types: Cigarettes    Quit date: 09/16/2011    Years since quitting: 7.3  . Smokeless tobacco: Never Used  Substance Use Topics  . Alcohol use: No  . Drug use: No    ALLERGIES:  has No Known Allergies.  MEDICATIONS:  Current Outpatient Medications  Medication Sig Dispense Refill  . acetaminophen (TYLENOL) 325 MG tablet Take 1 tablet (325 mg total) by mouth every 6 (six) hours.    . capecitabine (XELODA) 150 MG tablet TAKE 2 TABLETS (300 MG TOTAL) BY MOUTH 2 TIMES DAILY  AFTER A MEAL. TAKE FOR 14 DAYS ON, THEN 7 DAYS OFF. TAKE WITH THREE 500MG TABLETS (Patient not taking: Reported on 11/02/2018) 56 tablet 5  . capecitabine (XELODA) 500 MG tablet TAKE 3 TABLETS (1,500 MG TOTAL) BY MOUTH 2 (TWO) TIMES DAILY AFTER A MEAL. TAKE FOR 14 DAYS ON, THEN 7 DAYS OFF. TAKE WITH TWO 150MG TABLETS (Patient not taking: Reported on 11/02/2018) 84 tablet 5  . cromolyn (NASALCROM) 5.2 MG/ACT nasal spray Place 1 spray into both nostrils 4 (four) times daily as needed for allergies or rhinitis. (Patient not taking: Reported on 11/02/2018) 26 mL 0  . loratadine (CLARITIN) 10 MG tablet Take 10 mg daily by mouth.    . Multiple Vitamin (MULTIVITAMIN WITH MINERALS) TABS tablet Take 1 tablet by mouth daily. One-A-Day    . naproxen (NAPROSYN) 500 MG tablet Take 1 tablet (500 mg total) by mouth 2 (two) times daily as needed for mild pain.    . polyethylene glycol (MIRALAX / GLYCOLAX) packet Take 17 g by mouth daily as needed for mild constipation. (Patient not taking: Reported on 11/02/2018) 14 each 0  . pseudoephedrine-acetaminophen (TYLENOL SINUS) 30-500 MG TABS tablet Take 2 tablets by mouth daily.     Marland Kitchen senna (SENOKOT) 8.6 MG TABS tablet Take 1 tablet (8.6 mg total) by mouth 2 (two) times daily.  120 each 0  . sertraline (ZOLOFT) 100 MG tablet Take 1 tablet (100 mg total) by mouth at bedtime. 30 tablet 4  . triamcinolone ointment (KENALOG) 0.5 % Apply 1 application topically 2 (two) times daily. (Patient not taking: Reported on 11/02/2018) 30 g 0  . urea (CARMOL) 10 % cream Apply topically as needed. (Patient not taking: Reported on 11/02/2018) 71 g 0  . zolpidem (AMBIEN) 5 MG tablet Take 1 tablet (5 mg total) by mouth at bedtime as needed for sleep. (Patient not taking: Reported on 11/02/2018) 60 tablet 0   No current facility-administered medications for this visit.     PHYSICAL EXAMINATION: ECOG PERFORMANCE STATUS: 0 - Asymptomatic  BP 107/65   Pulse 98   Temp 97.6 F (36.4 C) (Tympanic)   Resp  20   Wt 153 lb (69.4 kg)   LMP 05/28/2017 (Approximate) Comment: no periods since start of chemo  BMI 23.96 kg/m   Filed Weights   01/13/19 1027  Weight: 153 lb (69.4 kg)    Physical Exam  Constitutional: She is oriented to person, place, and time and well-developed, well-nourished, and in no distress.  Is alone.  HENT:  Head: Normocephalic and atraumatic.  Mouth/Throat: Oropharynx is clear and moist. No oropharyngeal exudate.  Eyes: Pupils are equal, round, and reactive to light.  Neck: Normal range of motion. Neck supple.  Cardiovascular: Normal rate and regular rhythm.  Pulmonary/Chest: No respiratory distress. She has no wheezes.  Abdominal: Soft. Bowel sounds are normal. She exhibits no distension and no mass. There is no abdominal tenderness. There is no rebound and no guarding.  Musculoskeletal: Normal range of motion.        General: No tenderness or edema.  Neurological: She is alert and oriented to person, place, and time.  Skin: Skin is warm.  Psychiatric: Affect normal.   LABORATORY DATA:  I have reviewed the data as listed    Component Value Date/Time   NA 137 11/02/2018 1012   K 4.8 11/02/2018 1012   CL 100 11/02/2018 1012   CO2 26 11/02/2018 1012   GLUCOSE 82 11/02/2018 1012   BUN 20 11/02/2018 1012   CREATININE 0.67 11/02/2018 1012   CALCIUM 9.4 11/02/2018 1012   PROT 7.8 11/02/2018 1012   ALBUMIN 4.5 11/02/2018 1012   AST 19 11/02/2018 1012   ALT 14 11/02/2018 1012   ALKPHOS 106 11/02/2018 1012   BILITOT 0.4 11/02/2018 1012   GFRNONAA >60 11/02/2018 1012   GFRAA >60 11/02/2018 1012    No results found for: SPEP, UPEP  Lab Results  Component Value Date   WBC 7.0 11/02/2018   NEUTROABS 4.2 11/02/2018   HGB 12.9 11/02/2018   HCT 38.7 11/02/2018   MCV 94.4 11/02/2018   PLT 228 11/02/2018      Chemistry      Component Value Date/Time   NA 137 11/02/2018 1012   K 4.8 11/02/2018 1012   CL 100 11/02/2018 1012   CO2 26 11/02/2018 1012   BUN  20 11/02/2018 1012   CREATININE 0.67 11/02/2018 1012      Component Value Date/Time   CALCIUM 9.4 11/02/2018 1012   ALKPHOS 106 11/02/2018 1012   AST 19 11/02/2018 1012   ALT 14 11/02/2018 1012   BILITOT 0.4 11/02/2018 1012       RADIOGRAPHIC STUDIES: I have personally reviewed the radiological images as listed and agreed with the findings in the report. No results found.   ASSESSMENT & PLAN:  Malignant  neoplasm of upper-inner quadrant of right breast in female, estrogen receptor negative ([place]) # Stage I triple negative breast cancer; post neoadjuvant chemotherapy- 6 mm residual disease.  Adjuvant Xeloda-  S/p cycle #8 [finished May 03, 2018]. SEP 2020- CT C/A/P- NED.   # Clinically no evidence of disease noted; STABLE.   #Weight gain likely second to postmenopausal status- improved.   # Insomnia-continue Ambien; STABLE.   # Flu shot today.  Again counseled the patient regarding COVID pandemic; recommend precautions.   # DISPOSITION:  # follow up in 4 months; MD; cbc/cmp;Dr.B  # I reviewed the blood work- with the patient in detail; also reviewed the imaging independently [as summarized above]; and with the patient in detail.   # 25 minutes face-to-face with the patient discussing the above plan of care; more than 50% of time spent on prognosis/ natural history; counseling and coordination.     Orders Placed This Encounter  Procedures  . CBC with Differential    Standing Status:   Future    Standing Expiration Date:   01/13/2020  . Comprehensive metabolic panel    Standing Status:   Future    Standing Expiration Date:   01/13/2020   All questions were answered. The patient knows to call the clinic with any problems, questions or concerns.      Cammie Sickle, MD 01/13/2019 11:13 AM

## 2019-01-13 NOTE — Assessment & Plan Note (Addendum)
#   Stage I triple negative breast cancer; post neoadjuvant chemotherapy- 6 mm residual disease.  Adjuvant Xeloda-  S/p cycle #8 [finished May 03, 2018]. SEP 2020- CT C/A/P- NED.   # Clinically no evidence of disease noted; STABLE.   #Weight gain likely second to postmenopausal status- improved.   # Insomnia-continue Ambien; STABLE.   # Flu shot today.  Again counseled the patient regarding COVID pandemic; recommend precautions.   # DISPOSITION:  # follow up in 4 months; MD; cbc/cmp;Dr.B  # I reviewed the blood work- with the patient in detail; also reviewed the imaging independently [as summarized above]; and with the patient in detail.   # 25 minutes face-to-face with the patient discussing the above plan of care; more than 50% of time spent on prognosis/ natural history; counseling and coordination.

## 2019-02-02 DIAGNOSIS — Z90710 Acquired absence of both cervix and uterus: Secondary | ICD-10-CM | POA: Insufficient documentation

## 2019-02-25 ENCOUNTER — Other Ambulatory Visit (HOSPITAL_COMMUNITY): Payer: Self-pay | Admitting: Internal Medicine

## 2019-02-25 DIAGNOSIS — Z006 Encounter for examination for normal comparison and control in clinical research program: Secondary | ICD-10-CM

## 2019-03-15 ENCOUNTER — Other Ambulatory Visit: Payer: Self-pay | Admitting: *Deleted

## 2019-03-15 DIAGNOSIS — C50211 Malignant neoplasm of upper-inner quadrant of right female breast: Secondary | ICD-10-CM

## 2019-03-16 ENCOUNTER — Telehealth: Payer: Self-pay | Admitting: *Deleted

## 2019-03-16 ENCOUNTER — Other Ambulatory Visit: Payer: Self-pay | Admitting: *Deleted

## 2019-03-16 NOTE — Telephone Encounter (Signed)
-----   Message from [place] sent at 03/16/2019  1:48 PM EST ----- Regarding: FW: Research lab and Research appt at [facility]  ----- Message ----- From: Wallene Dales Sent: 03/16/2019   1:24 PM EST To: Arnoldo Lenis, RN Subject: Research lab and Research appt at [facility] pt to completed Covid screening and she stated her household has been infected. She will need to cancel research lab and appt @ [facility] scheduled for tomorrow. She is requesting a call back.  Thank you Jerene Pitch

## 2019-03-16 NOTE — Telephone Encounter (Signed)
Contacted Patient via telephone. She tested negative for her covid testing but has covid like symptoms of headache/body aches. She states that she is feeling better. Her entire family was also positive. She was instructed by her provider to continue to quarantine at home.  She does not want to be retested at this time- given her improvement on her symptoms.

## 2019-03-17 ENCOUNTER — Ambulatory Visit (HOSPITAL_COMMUNITY): Admission: RE | Admit: 2019-03-17 | Source: Ambulatory Visit

## 2019-03-17 ENCOUNTER — Inpatient Hospital Stay

## 2019-03-21 ENCOUNTER — Telehealth: Payer: Self-pay | Admitting: Internal Medicine

## 2019-03-21 NOTE — Telephone Encounter (Signed)
Late entry-on November 20-I spoke to patient regarding her diagnosis of Covid.  Currently doing well fairly asymptomatic.

## 2019-03-30 ENCOUNTER — Other Ambulatory Visit: Payer: Self-pay

## 2019-03-31 ENCOUNTER — Inpatient Hospital Stay

## 2019-03-31 ENCOUNTER — Ambulatory Visit (HOSPITAL_COMMUNITY)
Admission: RE | Admit: 2019-03-31 | Discharge: 2019-03-31 | Disposition: A | Discharge status: Home / Self Care | Payer: Self-pay | Source: Ambulatory Visit | Attending: Internal Medicine | Admitting: Internal Medicine

## 2019-03-31 ENCOUNTER — Encounter: Payer: Self-pay | Admitting: *Deleted

## 2019-03-31 ENCOUNTER — Other Ambulatory Visit: Payer: Self-pay

## 2019-03-31 ENCOUNTER — Inpatient Hospital Stay: Attending: Internal Medicine

## 2019-03-31 DIAGNOSIS — C50211 Malignant neoplasm of upper-inner quadrant of right female breast: Secondary | ICD-10-CM | POA: Insufficient documentation

## 2019-03-31 DIAGNOSIS — Z171 Estrogen receptor negative status [ER-]: Secondary | ICD-10-CM

## 2019-03-31 DIAGNOSIS — Z006 Encounter for examination for normal comparison and control in clinical research program: Secondary | ICD-10-CM | POA: Insufficient documentation

## 2019-03-31 NOTE — Research (Signed)
Holly Parker presents to clinic this afternoon for her 24 month and final study visit for the [facility] [id number] UPBEAT study. She was seen at [facility] to complete her 24 month Cardiac MRI earlier this morning. She also had [place] study labs collected per protocol as well as her height, weight, waist circumference, B/P and Pulse measured while in clinic. Patient's weight was 155.3lbs.in light weight clothing. Height was 67 inches without shoes. BMI per NIH calculator is 24.3. Waist circumference was 30 inches even. Initial B/P was 101/68 in left arm and HR 80 after resting for more than 5 minutes and sitting straight with back supported and legs uncrossed. After another minute, B/P had decreased to 91/61and HR was 73 with similar position and using the left arm. Holly Parker reports her B/P usually runs around 100/60, so these readings are consistent with recent B/P measurements. She denies any medication changes since her last MD clinic visit. Patient completed her 6 minute walk and physical performance battery, her neurocognitive assessment, the [place] Cardiomyopathy and 24 month self-administered questionnaires while in clinic today. She was given a Geophysical data processor gift card for which she signed as having received, along with an UPBEAT study tote and breast cancer sticker. Patient was informed that this is her final study-related visit and was thanked for participating in Research. Of note, Holly Parker was originally scheduled to complete this visit on 03/17/2019, but she called and informed me that she and her entire family had been diagnosed with COVID-19 a couple of weeks prior and and reported that was still symptomatic with a headache and runny nose. For this reason, the 24 month visit was postponed, but remains well within the 60 day visit window. [name], BSN, MHA, OCN 03/31/2019 11:55 AM

## 2019-05-04 ENCOUNTER — Ambulatory Visit: Admitting: Plastic Surgery

## 2019-05-14 ENCOUNTER — Telehealth: Payer: Self-pay | Admitting: *Deleted

## 2019-05-14 NOTE — Telephone Encounter (Signed)
T/C made to Rana Snare for purpose of completing the [place] [facility] form related the the [place] UPBEAT study she is participating in. This form was inadvertently missed at her recent clinic visit on 03/31/2019. The questionnaire was discussed with patient and preliminary questions were asked. Patient reports she was indeed hospitalized at [facility] within the past year for a planned hysterectomy procedure. She denies experiencing any cardiac events or having any cardiac procedures, which would require more extensive study reporting. Following completion of the first 2 questions, it was determined that the patient did not need to complete the rest of the form. Patient states she is doing well. Will continue annual follow up with patient for this study. [name], BSN, MHA, OCN 05/14/2019 3:03 PM

## 2019-05-19 ENCOUNTER — Inpatient Hospital Stay

## 2019-05-19 ENCOUNTER — Inpatient Hospital Stay: Admitting: Internal Medicine

## 2019-05-26 ENCOUNTER — Inpatient Hospital Stay: Admitting: Internal Medicine

## 2019-05-26 ENCOUNTER — Inpatient Hospital Stay

## 2019-06-16 ENCOUNTER — Inpatient Hospital Stay: Attending: Internal Medicine

## 2019-06-16 ENCOUNTER — Other Ambulatory Visit: Payer: Self-pay

## 2019-06-16 ENCOUNTER — Inpatient Hospital Stay: Admitting: Internal Medicine

## 2019-06-16 NOTE — Assessment & Plan Note (Addendum)
#   Stage I triple negative breast cancer; post neoadjuvant chemotherapy- 6 mm residual disease.  Adjuvant Xeloda-  S/p cycle #8 [finished May 03, 2018]. SEP 2020- CT C/A/P- NED.   # Clinically no evidence of disease noted; STABLE.   #Weight gain likely second to postmenopausal status- improved.   # Insomnia-continue Ambien; STABLE.   # Flu shot today.  Again counseled the patient regarding COVID pandemic; recommend precautions.   # DISPOSITION:  # follow up in 4 months; MD; cbc/cmp;Dr.B

## 2019-06-16 NOTE — Progress Notes (Unsigned)
I connected with Holly Parker on 06/16/19 at  3:00 PM EST by {Blank single:19197::"video enabled telemedicine visit","telephone visit"} and verified that I am speaking with the correct person using two identifiers.  I discussed the limitations, risks, security and privacy concerns of performing an evaluation and management service by telemedicine and the availability of in-person appointments. I also discussed with the patient that there may be a patient responsible charge related to this service. The patient expressed understanding and agreed to proceed.    Other persons participating in the visit and their role in the encounter: RN/medical reconciliation Patient's location: *** Provider's location: Home  Oncology History Overview Note  # OCT 2018- RIGHT BREAST UIQ -[place]; TRIPLE NEGATIVE; ki-67-high [48m-1'O clock-Bx- proven; [place]]; another- 12'O clock-469m Not biopsied. [s/p Dr.Magrinaut; [place]]; cT1c(m) cN0;  G-3; ki-67- 80%  # 11/218- ddACesolved;  x4 cyles- US/ammo- improved 10m34mothe leison-r-Taxol+carbo [finished [place] April 2019]  # MAY 2019- s/p Mastec& SLNBx [Dr.Toth; [place]]; 6mm610msidual ca; 3 SLN-NEG [ypT1bsnN0]; s/p 8 cycles of xeloda [finished jan 5th 2020]  # BRCA-1 Gene mutated [mom-ovarian cancer-Stage IIIB/Brca; older sister- breast ca/brca-mutated]  # MUGA scan [nov [id number]]%; UPBEAT clinical trial; port explanted.   # TAH &BSO [s/p Oct 29th/Dr.Pearson; breast recon reconstruction- dec 3rd] --------------------------------------------------------   DIAGNOSIS: TRIPLE NEGATIVE BREAST CA  STAGE:  I   ; GOALS: CURATIVE  CURRENT/MOST RECENT THERAPY: surveillaince    Malignant neoplasm of upper-inner quadrant of right breast in female, estrogen receptor negative (HCC)[place]  Chief Complaint: ***    History of present illness:Holly A UndeDelorse Leky38.  female with history of   Observation/objective:  Assessment and plan: Malignant neoplasm of upper-inner  quadrant of right breast in female, estrogen receptor negative (HCC)[place] cancer; post neoadjuvant chemotherapy- 6 mm residual disease.  Adjuvant Xeloda-  S/p cycle #8 [finished May 03, 2018]. SEP 2020- CT C/A/P- NED.   # Clinically no evidence of disease noted; STABLE.   #Weight gain likely second to postmenopausal status- improved.   # Insomnia-continue Ambien; STABLE.   # Flu shot today.  Again counseled the patient regarding COVID pandemic; recommend precautions.   # DISPOSITION:  # follow up in 4 months; MD; cbc/cmp;Dr.B  # I reviewed the blood work- with the patient in detail; also reviewed the imaging independently [as summarized above]; and with the patient in detail.   # 25 minutes face-to-face with the patient discussing the above plan of care; more than 50% of time spent on prognosis/ natural history; counseling and coordination.      Follow-up instructions:  I discussed the assessment and treatment plan with the patient.  The patient was provided an opportunity to ask questions and all were answered.  The patient agreed with the plan and demonstrated understanding of instructions.  The patient was advised to call back or seek an in person evaluation if the symptoms worsen or if the condition fails to improve as anticipated.  I provided *** minutes of {Blank single:19197::"face-to-face video visit time","non face-to-face telephone visit time"} during this encounter, and > 50% was spent counseling as documented under my assessment & plan.   Dr. GoviCharlaine DaltonCYork HamletAlamRiver View Surgery Center7/2021 3:07 PM

## 2019-10-13 ENCOUNTER — Inpatient Hospital Stay

## 2019-10-13 ENCOUNTER — Inpatient Hospital Stay: Admitting: Internal Medicine

## 2019-10-20 ENCOUNTER — Other Ambulatory Visit: Payer: Self-pay

## 2019-10-20 ENCOUNTER — Inpatient Hospital Stay

## 2019-10-20 ENCOUNTER — Other Ambulatory Visit: Payer: Self-pay | Admitting: Internal Medicine

## 2019-10-20 ENCOUNTER — Encounter: Payer: Self-pay | Admitting: Internal Medicine

## 2019-10-20 ENCOUNTER — Inpatient Hospital Stay: Attending: Internal Medicine | Admitting: Internal Medicine

## 2019-10-20 VITALS — BP 124/69 | HR 70 | Temp 97.8°F | Resp 18 | Ht 66.0 in | Wt 155.0 lb

## 2019-10-20 DIAGNOSIS — R5383 Other fatigue: Secondary | ICD-10-CM

## 2019-10-20 DIAGNOSIS — C50211 Malignant neoplasm of upper-inner quadrant of right female breast: Secondary | ICD-10-CM

## 2019-10-20 DIAGNOSIS — R21 Rash and other nonspecific skin eruption: Secondary | ICD-10-CM | POA: Diagnosis not present

## 2019-10-20 DIAGNOSIS — Z171 Estrogen receptor negative status [ER-]: Secondary | ICD-10-CM | POA: Diagnosis not present

## 2019-10-20 DIAGNOSIS — Z79899 Other long term (current) drug therapy: Secondary | ICD-10-CM | POA: Diagnosis not present

## 2019-10-20 DIAGNOSIS — Z8041 Family history of malignant neoplasm of ovary: Secondary | ICD-10-CM | POA: Diagnosis not present

## 2019-10-20 DIAGNOSIS — Z801 Family history of malignant neoplasm of trachea, bronchus and lung: Secondary | ICD-10-CM | POA: Insufficient documentation

## 2019-10-20 DIAGNOSIS — Z803 Family history of malignant neoplasm of breast: Secondary | ICD-10-CM | POA: Diagnosis not present

## 2019-10-20 DIAGNOSIS — G4700 Insomnia, unspecified: Secondary | ICD-10-CM | POA: Diagnosis not present

## 2019-10-20 DIAGNOSIS — Z9221 Personal history of antineoplastic chemotherapy: Secondary | ICD-10-CM | POA: Insufficient documentation

## 2019-10-20 DIAGNOSIS — Z806 Family history of leukemia: Secondary | ICD-10-CM | POA: Diagnosis not present

## 2019-10-20 DIAGNOSIS — Z87891 Personal history of nicotine dependence: Secondary | ICD-10-CM | POA: Diagnosis not present

## 2019-10-20 LAB — CBC WITH DIFFERENTIAL/PLATELET
Abs Immature Granulocytes: 0.01 10*3/uL (ref 0.00–0.07)
Basophils Absolute: 0.1 10*3/uL (ref 0.0–0.1)
Basophils Relative: 1 %
Eosinophils Absolute: 0.4 10*3/uL (ref 0.0–0.5)
Eosinophils Relative: 6 %
HCT: 40 % (ref 36.0–46.0)
Hemoglobin: 13.5 g/dL (ref 12.0–15.0)
Immature Granulocytes: 0 %
Lymphocytes Relative: 29 %
Lymphs Abs: 1.9 10*3/uL (ref 0.7–4.0)
MCH: 31.2 pg (ref 26.0–34.0)
MCHC: 33.8 g/dL (ref 30.0–36.0)
MCV: 92.4 fL (ref 80.0–100.0)
Monocytes Absolute: 0.5 10*3/uL (ref 0.1–1.0)
Monocytes Relative: 7 %
Neutro Abs: 3.7 10*3/uL (ref 1.7–7.7)
Neutrophils Relative %: 57 %
Platelets: 244 10*3/uL (ref 150–400)
RBC: 4.33 MIL/uL (ref 3.87–5.11)
RDW: 12.1 % (ref 11.5–15.5)
WBC: 6.5 10*3/uL (ref 4.0–10.5)
nRBC: 0 % (ref 0.0–0.2)

## 2019-10-20 LAB — COMPREHENSIVE METABOLIC PANEL
ALT: 13 U/L (ref 0–44)
AST: 19 U/L (ref 15–41)
Albumin: 4.5 g/dL (ref 3.5–5.0)
Alkaline Phosphatase: 95 U/L (ref 38–126)
Anion gap: 9 (ref 5–15)
BUN: 15 mg/dL (ref 6–20)
CO2: 28 mmol/L (ref 22–32)
Calcium: 9.4 mg/dL (ref 8.9–10.3)
Chloride: 101 mmol/L (ref 98–111)
Creatinine, Ser: 0.65 mg/dL (ref 0.44–1.00)
GFR calc Af Amer: >60 mL/min (ref >60)
GFR calc non Af Amer: >60 mL/min (ref >60)
Glucose, Bld: 92 mg/dL (ref 70–99)
Potassium: 4.3 mmol/L (ref 3.5–5.1)
Sodium: 138 mmol/L (ref 135–145)
Total Bilirubin: 0.5 mg/dL (ref 0.3–1.2)
Total Protein: 8.1 g/dL (ref 6.5–8.1)

## 2019-10-20 LAB — TSH: TSH: 0.805 u[IU]/mL (ref 0.350–4.500)

## 2019-10-20 NOTE — Assessment & Plan Note (Addendum)
#  Stage I triple negative breast cancer; post neoadjuvant chemotherapy- 6 mm residual disease.  Adjuvant Xeloda-  S/p cycle #8 [finished May 03, 2018]. SEP 2020- CT C/A/P- NED.   # Clinically no evidence of disease noted; STABLE.  # Insomnia--on melatonin;  STABLE.    # skin rash-question mosquito bite recommend calamine lotion; pramoxine over-the-counter  # bone health-recommend continue calcium with vitamin D. Patient is currently postmenopausal. We will get a bone density test on the line. Patient is physically active.  #Weight gain-likely postmenopausal status; check TSH.  #BRCA-reviewed recent CT scan-negative for pancreatic lesions-September 2020  # DISPOSITION: ADD TSH TO LABS TODAY # follow up in 7 months; MD; cbc/cmp;Dr.B

## 2019-10-20 NOTE — Progress Notes (Signed)
[facility] OFFICE PROGRESS NOTE  Patient Care Team: Serita Grammes, MD as PCP - General (Family Medicine) Magrinat, Virgie Dad, MD as Consulting Physician (Oncology) Marga Hoots, MD (Obstetrics and Gynecology) Dillingham, Loel Lofty, DO as Attending Physician (Plastic Surgery) Coralie Keens, MD as Consulting Physician (General Surgery) Cammie Sickle, MD as Consulting Physician (Internal Medicine) Rico Junker, RN as Oncology Nurse Navigator Jovita Kussmaul, MD as Consulting Physician (General Surgery)  Cancer Staging No matching staging information was found for the patient.   Oncology History Overview Note  # OCT 2018- RIGHT BREAST UIQ -[place]; TRIPLE NEGATIVE; ki-67-high [351m-1'O clock-Bx- proven; [place]]; another- 12'O clock-445m Not biopsied. [s/p Dr.Magrinaut; [place]]; cT1c(m) cN0;  G-3; ki-67- 80%  # 11/218- ddACesolved;  x4 cyles- US/ammo- improved 51m24mothe leison-r-Taxol+carbo [finished ealry April 2019]  # MAY 2019- s/p Mastec& SLNBx [Dr.Toth; [place]]; 6mm55msidual ca; 3 SLN-NEG [ypT1bsnN0]; s/p 8 cycles of xeloda [finished jan 5th 2020]  # BRCA-1 Gene mutated [mom-ovarian cancer-Stage IIIB/Brca; older sister- breast ca/brca-mutated]  # MUGA scan [nov [id number]]%; UPBEAT clinical trial; port explanted.   # TAH &BSO [s/p Oct 29th/Dr.Pearson; breast recon reconstruction- dec 3rd] --------------------------------------------------------   DIAGNOSIS: TRIPLE NEGATIVE BREAST CA  STAGE:  I   ; GOALS: CURATIVE  CURRENT/MOST RECENT THERAPY: surveillaince    Malignant neoplasm of upper-inner quadrant of right breast in female, estrogen receptor negative (HCC)[name]   INTERVAL HISTORY:  Holly GALIy30.  female pleasant patient above history of stage I triple negative breast cancer currently on surveillance is here for follow-up.  Patient appetite is good. No weight loss. She is currently in vacation.   Complains of an insect bite on  the anterior chest. Mild itching.  No headaches. No nausea vomiting.  Review of Systems  Constitutional: Negative for chills, diaphoresis, fever, malaise/fatigue and weight loss.  HENT: Negative for nosebleeds and sore throat.   Eyes: Negative for double vision.  Respiratory: Negative for cough, hemoptysis, sputum production, shortness of breath and wheezing.   Cardiovascular: Negative for chest pain, palpitations, orthopnea and leg swelling.  Gastrointestinal: Negative for abdominal pain, blood in stool, constipation, diarrhea, heartburn, melena, nausea and vomiting.  Genitourinary: Negative for dysuria, frequency and urgency.  Musculoskeletal: Negative for back pain and joint pain.  Skin: Positive for itching and rash.  Neurological: Negative for dizziness, tingling, focal weakness, weakness and headaches.  Endo/Heme/Allergies: Does not bruise/bleed easily.  Psychiatric/Behavioral: Negative for depression. The patient is not nervous/anxious and does not have insomnia.       PAST MEDICAL HISTORY :  Past Medical History:  Diagnosis Date  . Anemia    iron deficiency during chemotherapy  . Anxiety   . Asthma    Excercise induced as a child  . Fibromyalgia   . Headache    migraine headaches due to sinus/allergies and teeth grinding at night  . Malignant neoplasm of upper-inner quadrant of right breast in female, estrogen receptor negative (HCC)[place]18   right breast  . Personal history of chemotherapy    Right breast- 4 treatments    PAST SURGICAL HISTORY :   Past Surgical History:  Procedure Laterality Date  . ABDOMINAL HYSTERECTOMY    . BREAST RECONSTRUCTION WITH PLACEMENT OF TISSUE EXPANDER AND FLEX HD (ACELLULAR HYDRATED DERMIS) Bilateral 09/15/2017   Procedure: BREAST RECONSTRUCTION WITH PLACEMENT OF TISSUE EXPANDER AND FLEX HD (ACELLULAR HYDRATED DERMIS);  Surgeon: DillWallace Going;  Location: [facility] ORS;  Service: Plastics;  Laterality:  Bilateral;  . IR IMAGING  GUIDED PORT INSERTION  03/17/2017  . KNEE SURGERY Left 2009   Torn meniscus   . NIPPLE SPARING MASTECTOMY/SENTINAL LYMPH NODE BIOPSY/RECONSTRUCTION/PLACEMENT OF TISSUE EXPANDER Bilateral 09/15/2017   Procedure: BILATERAL NIPPLE SPARING MASTECTOMIES  WITH RIGHT  SENTINAL LYMPH NODE BIOPSY;  Surgeon: Jovita Kussmaul, MD;  Location: [facility] ORS;  Service: General;  Laterality: Bilateral;  . PORT-A-CATH REMOVAL N/A 09/15/2017   Procedure: REMOVAL PORT-A-CATH;  Surgeon: Jovita Kussmaul, MD;  Location: [facility] ORS;  Service: General;  Laterality: N/A;  . REMOVAL OF BILATERAL TISSUE EXPANDERS WITH PLACEMENT OF BILATERAL BREAST IMPLANTS Bilateral 04/01/2018   Procedure: REMOVAL OF BILATERAL TISSUE EXPANDERS WITH PLACEMENT OF BILATERAL BREAST IMPLANTS;  Surgeon: Wallace Going, DO;  Location: [facility] ORS;  Service: Plastics;  Laterality: Bilateral;  . RHINOPLASTY  [id number]   both surgeries due to broken nose    FAMILY HISTORY :   Family History  Problem Relation Age of Onset  . Cancer Mother        Ovarian cancer (2003), breast cancer(2017) & squamous cell   . Leukemia Mother   . Breast cancer Mother 76  . Cancer Sister        breast cancer  . Breast cancer Sister 61  . Melanoma Father   . Lung cancer Paternal Grandmother     SOCIAL HISTORY:   Social History   Tobacco Use  . Smoking status: Former Smoker    Packs/day: 0.25    Types: Cigarettes    Quit date: 09/16/2011    Years since quitting: 8.1  . Smokeless tobacco: Never Used  Vaping Use  . Vaping Use: Never used  Substance Use Topics  . Alcohol use: No  . Drug use: No    ALLERGIES:  has No Known Allergies.  MEDICATIONS:  Current Outpatient Medications  Medication Sig Dispense Refill  . acetaminophen (TYLENOL) 325 MG tablet Take 1 tablet (325 mg total) by mouth every 6 (six) hours.    . cromolyn (NASALCROM) 5.2 MG/ACT nasal spray Place 1 spray into both nostrils 4 (four) times daily as needed for allergies or rhinitis. 26 mL 0  .  loratadine (CLARITIN) 10 MG tablet Take 10 mg daily by mouth.    . Multiple Vitamin (MULTIVITAMIN WITH MINERALS) TABS tablet Take 1 tablet by mouth daily. One-A-Day    . naproxen (NAPROSYN) 500 MG tablet Take 1 tablet (500 mg total) by mouth 2 (two) times daily as needed for mild pain.    Marland Kitchen sertraline (ZOLOFT) 100 MG tablet Take 1 tablet (100 mg total) by mouth at bedtime. 30 tablet 4  . pseudoephedrine-acetaminophen (TYLENOL SINUS) 30-500 MG TABS tablet Take 2 tablets by mouth daily.  (Patient not taking: Reported on 10/20/2019)     No current facility-administered medications for this visit.    PHYSICAL EXAMINATION: ECOG PERFORMANCE STATUS: 0 - Asymptomatic  BP 124/69 (BP Location: Left Arm, Patient Position: Sitting)   Pulse 70   Temp 97.8 F (36.6 C) (Tympanic)   Resp 18   Ht '5\' 6"'  (1.676 m)   Wt 155 lb (70.3 kg)   LMP 05/28/2017 (Approximate) Comment: no periods since start of chemo  BMI 25.02 kg/m   Filed Weights   10/20/19 1319  Weight: 155 lb (70.3 kg)    Physical Exam Constitutional:      Comments: Is alone.  HENT:     Head: Normocephalic and atraumatic.     Mouth/Throat:     Pharynx: No oropharyngeal exudate.  Eyes:     Pupils: Pupils are equal, round, and reactive to light.  Cardiovascular:     Rate and Rhythm: Normal rate and regular rhythm.  Pulmonary:     Effort: No respiratory distress.     Breath sounds: No wheezing.  Abdominal:     General: Bowel sounds are normal. There is no distension.     Palpations: Abdomen is soft. There is no mass.     Tenderness: There is no abdominal tenderness. There is no guarding or rebound.  Musculoskeletal:        General: No tenderness. Normal range of motion.     Cervical back: Normal range of motion and neck supple.  Skin:    General: Skin is warm.     Comments: Bilateral chest wall exam-no concerns for any local recurrence; reconstructed breast.  Mild erythema-noted anteriorly skin suspicious of mosquito bites   Neurological:     Mental Status: She is alert and oriented to person, place, and time.  Psychiatric:        Mood and Affect: Affect normal.    LABORATORY DATA:  I have reviewed the data as listed    Component Value Date/Time   NA 138 10/20/2019 1304   K 4.3 10/20/2019 1304   CL 101 10/20/2019 1304   CO2 28 10/20/2019 1304   GLUCOSE 92 10/20/2019 1304   BUN 15 10/20/2019 1304   CREATININE 0.65 10/20/2019 1304   CALCIUM 9.4 10/20/2019 1304   PROT 8.1 10/20/2019 1304   ALBUMIN 4.5 10/20/2019 1304   AST 19 10/20/2019 1304   ALT 13 10/20/2019 1304   ALKPHOS 95 10/20/2019 1304   BILITOT 0.5 10/20/2019 1304   GFRNONAA >60 10/20/2019 1304   GFRAA >60 10/20/2019 1304    No results found for: SPEP, UPEP  Lab Results  Component Value Date   WBC 6.5 10/20/2019   NEUTROABS 3.7 10/20/2019   HGB 13.5 10/20/2019   HCT 40.0 10/20/2019   MCV 92.4 10/20/2019   PLT 244 10/20/2019      Chemistry      Component Value Date/Time   NA 138 10/20/2019 1304   K 4.3 10/20/2019 1304   CL 101 10/20/2019 1304   CO2 28 10/20/2019 1304   BUN 15 10/20/2019 1304   CREATININE 0.65 10/20/2019 1304      Component Value Date/Time   CALCIUM 9.4 10/20/2019 1304   ALKPHOS 95 10/20/2019 1304   AST 19 10/20/2019 1304   ALT 13 10/20/2019 1304   BILITOT 0.5 10/20/2019 1304       RADIOGRAPHIC STUDIES: I have personally reviewed the radiological images as listed and agreed with the findings in the report. No results found.   ASSESSMENT & PLAN:  Malignant neoplasm of upper-inner quadrant of right breast in female, estrogen receptor negative ([place]) # Stage I triple negative breast cancer; post neoadjuvant chemotherapy- 6 mm residual disease.  Adjuvant Xeloda-  S/p cycle #8 [finished May 03, 2018]. SEP 2020- CT C/A/P- NED.   # Clinically no evidence of disease noted; STABLE.  # Insomnia--on melatonin;  STABLE.    # skin rash-question mosquito bite recommend calamine lotion; pramoxine  over-the-counter  # bone health-recommend continue calcium with vitamin D. Patient is currently postmenopausal. We will get a bone density test on the line. Patient is physically active.  #Weight gain-likely postmenopausal status; check TSH.  #BRCA-reviewed recent CT scan-negative for pancreatic lesions-September 2020  # DISPOSITION: ADD TSH TO LABS TODAY # follow up in 7 months; MD;  cbc/cmp;Dr.B      Orders Placed This Encounter  Procedures  . TSH    Standing Status:   Future    Number of Occurrences:   1    Standing Expiration Date:   10/19/2020  . Comprehensive metabolic panel    Standing Status:   Future    Standing Expiration Date:   10/19/2020  . CBC with Differential    Standing Status:   Future    Standing Expiration Date:   10/19/2020   All questions were answered. The patient knows to call the clinic with any problems, questions or concerns.      Cammie Sickle, MD 10/21/2019 2:59 PM

## 2020-03-17 ENCOUNTER — Telehealth: Payer: Self-pay | Admitting: Radiology

## 2020-03-17 NOTE — Telephone Encounter (Addendum)
WF [id number]- UNDERSTANDING AND PREDICTING BREAST CANCER EVENTS AFTER TREATMENT (UPBEAT)  03/17/2020      9:30 AM  6 MONTH PHONE CALL: Left V/M for patient to return my call for 36 month UPBEAT visit. Patient did not return my call. Plan is to send a follow-up letter as a final attempt to follow up with patient.   Carol Ada, RT(R)(T) Clinical Research Coordinator

## 2020-03-22 DIAGNOSIS — Z171 Estrogen receptor negative status [ER-]: Secondary | ICD-10-CM

## 2020-03-22 DIAGNOSIS — C50211 Malignant neoplasm of upper-inner quadrant of right female breast: Secondary | ICD-10-CM

## 2020-03-22 NOTE — Research (Signed)
WF [id number] "UPBEAT" Protocol 3 Year Phone Call:  Research nurse called patient for her 3 year phone call visit. The [facility] Cardiomyopathy questionnaire [facility]) was reviewed with the patient as well as the Cardiac Event Form. She denies having had any hospitalizations, CVA, MI, PCI, CABG, Cath, Stroke, or heart failure. She did state that she is continuing to have a little shortness of breath since she had Covid last October and currently has a sinus infection. She doesn't know what's causing the shortness of breath, but feels these issues are the contributors. The shortness of breath does not keep her from maintaining an active lifestyle or change any of her ADL's. The consent addendum was reviewed with the patient in it's entirety and the patient was agreeable to having all questionnaires emailed to her in the future when applicable. The research RN reviewed all the potential benefits, risks and alternatives related to the consent addendum, the patient denied having any questions or concerns. The patient is aware that she will be followed annually for the protocol at this time point and will receive a phone call next year around the same time. The patient was encouraged to call the research department for any concerns or questions she may have. Approximately 20 minutes was spent with the patient over the telephone for this visit and completion of research activities.  Jeral Fruit, RN, BSN, OCN Date: 03/22/20 Time: 11:57 AM

## 2020-05-23 ENCOUNTER — Inpatient Hospital Stay

## 2020-05-23 DIAGNOSIS — C50211 Malignant neoplasm of upper-inner quadrant of right female breast: Secondary | ICD-10-CM | POA: Diagnosis not present

## 2020-05-23 DIAGNOSIS — Z171 Estrogen receptor negative status [ER-]: Secondary | ICD-10-CM | POA: Insufficient documentation

## 2020-05-23 LAB — COMPREHENSIVE METABOLIC PANEL
ALT: 11 U/L (ref 0–44)
AST: 17 U/L (ref 15–41)
Albumin: 4.4 g/dL (ref 3.5–5.0)
Alkaline Phosphatase: 73 U/L (ref 38–126)
Anion gap: 8 (ref 5–15)
BUN: 17 mg/dL (ref 6–20)
CO2: 30 mmol/L (ref 22–32)
Calcium: 9.3 mg/dL (ref 8.9–10.3)
Chloride: 100 mmol/L (ref 98–111)
Creatinine, Ser: 0.61 mg/dL (ref 0.44–1.00)
GFR, Estimated: >60 mL/min (ref >60)
Glucose, Bld: 83 mg/dL (ref 70–99)
Potassium: 4 mmol/L (ref 3.5–5.1)
Sodium: 138 mmol/L (ref 135–145)
Total Bilirubin: 0.1 mg/dL — ABNORMAL LOW (ref 0.3–1.2)
Total Protein: 7.9 g/dL (ref 6.5–8.1)

## 2020-05-23 LAB — CBC WITH DIFFERENTIAL/PLATELET
Abs Immature Granulocytes: 0.02 10*3/uL (ref 0.00–0.07)
Basophils Absolute: 0.1 10*3/uL (ref 0.0–0.1)
Basophils Relative: 1 %
Eosinophils Absolute: 0.3 10*3/uL (ref 0.0–0.5)
Eosinophils Relative: 4 %
HCT: 41.9 % (ref 36.0–46.0)
Hemoglobin: 14 g/dL (ref 12.0–15.0)
Immature Granulocytes: 0 %
Lymphocytes Relative: 29 %
Lymphs Abs: 2 10*3/uL (ref 0.7–4.0)
MCH: 31 pg (ref 26.0–34.0)
MCHC: 33.4 g/dL (ref 30.0–36.0)
MCV: 92.7 fL (ref 80.0–100.0)
Monocytes Absolute: 0.5 10*3/uL (ref 0.1–1.0)
Monocytes Relative: 7 %
Neutro Abs: 4.1 10*3/uL (ref 1.7–7.7)
Neutrophils Relative %: 59 %
Platelets: 289 10*3/uL (ref 150–400)
RBC: 4.52 MIL/uL (ref 3.87–5.11)
RDW: 12.5 % (ref 11.5–15.5)
WBC: 7 10*3/uL (ref 4.0–10.5)
nRBC: 0 % (ref 0.0–0.2)

## 2020-05-24 ENCOUNTER — Inpatient Hospital Stay: Attending: Internal Medicine | Admitting: Internal Medicine

## 2020-05-24 ENCOUNTER — Inpatient Hospital Stay

## 2020-05-24 DIAGNOSIS — Z171 Estrogen receptor negative status [ER-]: Secondary | ICD-10-CM | POA: Diagnosis not present

## 2020-05-24 DIAGNOSIS — C50211 Malignant neoplasm of upper-inner quadrant of right female breast: Secondary | ICD-10-CM

## 2020-05-24 NOTE — Assessment & Plan Note (Addendum)
#   Stage I triple negative breast cancer; post neoadjuvant chemotherapy- 6 mm residual disease.  Adjuvant Xeloda-  S/p cycle #8 [finished May 03, 2018]. SEP 2020- CT C/A/P- NED.   # Clinically no evidence of disease noted; STABLE.  # Insomnia--on melatonin;  STABLE.    # bone health-recommend continue calcium with vitamin D. Patient is currently postmenopausal. We will get a bone density test down the line. Patient is physically active.  # DISPOSITION:  # follow up in 6 months; MD; cbc/cmp;Dr.B

## 2020-05-24 NOTE — Progress Notes (Signed)
1253 pm. - RN contacted patient prior to her virtual visit. No answer

## 2020-05-24 NOTE — Progress Notes (Signed)
I connected with Holly Parker on 05/24/2020 at  1:30 PM EST by video enabled telemedicine visit and verified that I am speaking with the correct person using two identifiers.  I discussed the limitations, risks, security and privacy concerns of performing an evaluation and management service by telemedicine and the availability of in-person appointments. I also discussed with the patient that there may be a patient responsible charge related to this service. The patient expressed understanding and agreed to proceed.    Other persons participating in the visit and their role in the encounter: RN/medical reconciliation Patients location: office Providers location: Home  Oncology History Overview Note  # OCT 2018- RIGHT BREAST UIQ -[place]; TRIPLE NEGATIVE; ki-67-high [36m-1'O clock-Bx- proven; [place]]; another- 12'O clock-465m Not biopsied. [s/p Dr.Magrinaut; [place]]; cT1c(m) cN0;  G-3; ki-67- 80%  # 11/218- ddACesolved;  x4 cyles- US/ammo- improved 55m55mothe leison-r-Taxol+carbo [finished ealry April 2019]  # MAY 2019- s/p Mastec& SLNBx [Dr.Toth; [place]]; 6mm80msidual ca; 3 SLN-NEG [ypT1bsnN0]; s/p 8 cycles of xeloda [finished jan 5th 2020]  # BRCA-1 Gene mutated [mom-ovarian cancer-Stage IIIB/Brca; older sister- breast ca/brca-mutated]  # MUGA scan [nov [id number]]%; UPBEAT clinical trial; port explanted.   # TAH &BSO [s/p Oct 29th/Dr.Pearson; breast recon reconstruction- dec 3rd] --------------------------------------------------------   DIAGNOSIS: TRIPLE NEGATIVE BREAST CA  STAGE:  I   ; GOALS: CURATIVE  CURRENT/MOST RECENT THERAPY: surveillaince    Malignant neoplasm of upper-inner quadrant of right breast in female, estrogen receptor negative (HCC)[place]  Chief Complaint: breast cancer   History of present illness:Holly A UndeBrintony9.  female with history of breast cancer stage I triple negative is here for follow-up.  Patient denies any new lumps or bumps.  Denies any  bone pain.  Denies any headaches.  No nausea no vomiting.  She continues to be physically active.  Observation/objective: No acute distress.  Alert.  Oriented X3.  Assessment and plan: Malignant neoplasm of upper-inner quadrant of right breast in female, estrogen receptor negative (HCC[place]; post neoadjuvant chemotherapy- 6 mm residual disease.  Adjuvant Xeloda-  S/p cycle #8 [finished May 03, 2018]. SEP 2020- CT C/A/P- NED.   # Clinically no evidence of disease noted; STABLE.  # Insomnia--on melatonin;  STABLE.    # bone health-recommend continue calcium with vitamin D. Patient is currently postmenopausal. We will get a bone density test down the line. Patient is physically active.  # DISPOSITION:  # follow up in 6 months; MD; cbc/cmp;Dr.B     Follow-up instructions:  I discussed the assessment and treatment plan with the patient.  The patient was provided an opportunity to ask questions and all were answered.  The patient agreed with the plan and demonstrated understanding of instructions.  The patient was advised to call back or seek an in person evaluation if the symptoms worsen or if the condition fails to improve as anticipated.   Dr. GoviCharlaine DaltonCKing GeorgeAlamR[facility] 8:28 AM

## 2020-07-06 DIAGNOSIS — J309 Allergic rhinitis, unspecified: Secondary | ICD-10-CM | POA: Insufficient documentation

## 2020-07-06 DIAGNOSIS — Z720 Tobacco use: Secondary | ICD-10-CM | POA: Insufficient documentation

## 2020-07-06 DIAGNOSIS — C569 Malignant neoplasm of unspecified ovary: Secondary | ICD-10-CM | POA: Insufficient documentation

## 2020-07-06 DIAGNOSIS — J329 Chronic sinusitis, unspecified: Secondary | ICD-10-CM | POA: Insufficient documentation

## 2020-07-06 DIAGNOSIS — L819 Disorder of pigmentation, unspecified: Secondary | ICD-10-CM | POA: Insufficient documentation

## 2020-11-21 ENCOUNTER — Other Ambulatory Visit: Payer: Self-pay | Admitting: *Deleted

## 2020-11-21 DIAGNOSIS — C50211 Malignant neoplasm of upper-inner quadrant of right female breast: Secondary | ICD-10-CM

## 2020-11-22 ENCOUNTER — Inpatient Hospital Stay: Attending: Internal Medicine

## 2020-11-22 ENCOUNTER — Inpatient Hospital Stay (HOSPITAL_BASED_OUTPATIENT_CLINIC_OR_DEPARTMENT_OTHER): Admitting: Internal Medicine

## 2020-11-22 ENCOUNTER — Encounter: Payer: Self-pay | Admitting: Internal Medicine

## 2020-11-22 DIAGNOSIS — Z806 Family history of leukemia: Secondary | ICD-10-CM | POA: Diagnosis not present

## 2020-11-22 DIAGNOSIS — Z171 Estrogen receptor negative status [ER-]: Secondary | ICD-10-CM | POA: Diagnosis not present

## 2020-11-22 DIAGNOSIS — R21 Rash and other nonspecific skin eruption: Secondary | ICD-10-CM | POA: Diagnosis not present

## 2020-11-22 DIAGNOSIS — Z808 Family history of malignant neoplasm of other organs or systems: Secondary | ICD-10-CM | POA: Insufficient documentation

## 2020-11-22 DIAGNOSIS — Z801 Family history of malignant neoplasm of trachea, bronchus and lung: Secondary | ICD-10-CM | POA: Insufficient documentation

## 2020-11-22 DIAGNOSIS — R519 Headache, unspecified: Secondary | ICD-10-CM | POA: Insufficient documentation

## 2020-11-22 DIAGNOSIS — Z803 Family history of malignant neoplasm of breast: Secondary | ICD-10-CM | POA: Insufficient documentation

## 2020-11-22 DIAGNOSIS — Z79899 Other long term (current) drug therapy: Secondary | ICD-10-CM | POA: Insufficient documentation

## 2020-11-22 DIAGNOSIS — G4700 Insomnia, unspecified: Secondary | ICD-10-CM | POA: Diagnosis not present

## 2020-11-22 DIAGNOSIS — Z8041 Family history of malignant neoplasm of ovary: Secondary | ICD-10-CM | POA: Insufficient documentation

## 2020-11-22 DIAGNOSIS — C50211 Malignant neoplasm of upper-inner quadrant of right female breast: Secondary | ICD-10-CM

## 2020-11-22 DIAGNOSIS — Z87891 Personal history of nicotine dependence: Secondary | ICD-10-CM | POA: Diagnosis not present

## 2020-11-22 DIAGNOSIS — L299 Pruritus, unspecified: Secondary | ICD-10-CM | POA: Insufficient documentation

## 2020-11-22 DIAGNOSIS — Z9221 Personal history of antineoplastic chemotherapy: Secondary | ICD-10-CM | POA: Insufficient documentation

## 2020-11-22 DIAGNOSIS — L538 Other specified erythematous conditions: Secondary | ICD-10-CM | POA: Insufficient documentation

## 2020-11-22 LAB — COMPREHENSIVE METABOLIC PANEL
ALT: 10 U/L (ref 0–44)
AST: 16 U/L (ref 15–41)
Albumin: 4.3 g/dL (ref 3.5–5.0)
Alkaline Phosphatase: 73 U/L (ref 38–126)
Anion gap: 6 (ref 5–15)
BUN: 14 mg/dL (ref 6–20)
CO2: 27 mmol/L (ref 22–32)
Calcium: 8.9 mg/dL (ref 8.9–10.3)
Chloride: 102 mmol/L (ref 98–111)
Creatinine, Ser: 0.72 mg/dL (ref 0.44–1.00)
GFR, Estimated: >60 mL/min (ref >60)
Glucose, Bld: 91 mg/dL (ref 70–99)
Potassium: 4.2 mmol/L (ref 3.5–5.1)
Sodium: 135 mmol/L (ref 135–145)
Total Bilirubin: 0.1 mg/dL — ABNORMAL LOW (ref 0.3–1.2)
Total Protein: 7.2 g/dL (ref 6.5–8.1)

## 2020-11-22 LAB — CBC WITH DIFFERENTIAL/PLATELET
Abs Immature Granulocytes: 0.02 10*3/uL (ref 0.00–0.07)
Basophils Absolute: 0.1 10*3/uL (ref 0.0–0.1)
Basophils Relative: 1 %
Eosinophils Absolute: 0.4 10*3/uL (ref 0.0–0.5)
Eosinophils Relative: 6 %
HCT: 39.6 % (ref 36.0–46.0)
Hemoglobin: 12.9 g/dL (ref 12.0–15.0)
Immature Granulocytes: 0 %
Lymphocytes Relative: 27 %
Lymphs Abs: 1.8 10*3/uL (ref 0.7–4.0)
MCH: 31.1 pg (ref 26.0–34.0)
MCHC: 32.6 g/dL (ref 30.0–36.0)
MCV: 95.4 fL (ref 80.0–100.0)
Monocytes Absolute: 0.5 10*3/uL (ref 0.1–1.0)
Monocytes Relative: 8 %
Neutro Abs: 3.9 10*3/uL (ref 1.7–7.7)
Neutrophils Relative %: 58 %
Platelets: 257 10*3/uL (ref 150–400)
RBC: 4.15 MIL/uL (ref 3.87–5.11)
RDW: 11.9 % (ref 11.5–15.5)
WBC: 6.7 10*3/uL (ref 4.0–10.5)
nRBC: 0 % (ref 0.0–0.2)

## 2020-11-22 NOTE — Assessment & Plan Note (Addendum)
#   Stage I triple negative breast cancer; post neoadjuvant chemotherapy- 6 mm residual disease.  Adjuvant Xeloda-  S/p cycle #8 [finished May 03, 2018]. SEP 2020- CT C/A/P- NED.   # Clinically no evidence of disease noted-stable.  #Mild erythema around the right nipple/no mass felt.  Seems superficial monitor for now.  # Insomnia--on melatonin-  STABLE.    # bone health-recommend continue calcium with vitamin D. Patient is currently postmenopausal.  Discussed regarding bone density; monitor for now.  Patient is physically active.  *dis # DISPOSITION:  # follow up in 6 months; MD; cbc/cmp;Dr.B

## 2020-11-22 NOTE — Progress Notes (Signed)
[facility] OFFICE PROGRESS NOTE  Patient Care Team: Serita Grammes, MD as PCP - General (Family Medicine) Magrinat, Virgie Dad, MD as Consulting Physician (Oncology) Marga Hoots, MD (Obstetrics and Gynecology) Dillingham, Loel Lofty, DO as Attending Physician (Plastic Surgery) Coralie Keens, MD as Consulting Physician (General Surgery) Cammie Sickle, MD as Consulting Physician (Internal Medicine) Rico Junker, RN as Oncology Nurse Navigator Jovita Kussmaul, MD as Consulting Physician (General Surgery)  Cancer Staging No matching staging information was found for the patient.   Oncology History Overview Note  # OCT 2018- RIGHT BREAST UIQ -[place]; TRIPLE NEGATIVE; ki-67-high [73m-1'O clock-Bx- proven; [place]]; another- 12'O clock-4468m Not biopsied. [s/p Dr.Magrinaut; [place]]; cT1c(m) cN0;  G-3; ki-67- 80%  # 11/218- ddACesolved;  x4 cyles- US/ammo- improved 68m31mothe leison-r-Taxol+carbo [finished [place]alry April 2019]  # MAY 2019- s/p BILATERAL Mastec& SLNBx [Dr.Toth; [place]]; 6mm69msidual ca; 3 SLN-NEG [ypT1bsnN0]; s/p 8 cycles of xeloda [finished jan 5th 2020]  # BRCA-1 Gene mutated [mom-ovarian cancer-Stage IIIB/Brca; older sister- breast ca/brca-mutated]  # MUGA scan [nov [id number]]%; UPBEAT clinical trial; port explanted.   # TAH &BSO [s/p Oct 29th/Dr.Pearson; breast recon reconstruction- dec 3rd] --------------------------------------------------------   DIAGNOSIS: TRIPLE NEGATIVE BREAST CA  STAGE:  I   ; GOALS: CURATIVE  CURRENT/MOST RECENT THERAPY: surveillaince    Malignant neoplasm of upper-inner quadrant of right breast in female, estrogen receptor negative (HCC)[place]   INTERVAL HISTORY:  Holly [id number].  female pleasant patient above history of stage I triple negative breast cancer currently on surveillance is here for follow-up.  Patient denies any loss of appetite denies any nausea vomiting.  No bone pain.  Headaches.  No  nausea no vomiting.  Noted to have mild redness around the nipple on the right breast.  No trauma.   Review of Systems  Constitutional:  Negative for chills, diaphoresis, fever, malaise/fatigue and weight loss.  HENT:  Negative for nosebleeds and sore throat.   Eyes:  Negative for double vision.  Respiratory:  Negative for cough, hemoptysis, sputum production, shortness of breath and wheezing.   Cardiovascular:  Negative for chest pain, palpitations, orthopnea and leg swelling.  Gastrointestinal:  Negative for abdominal pain, blood in stool, constipation, diarrhea, heartburn, melena, nausea and vomiting.  Genitourinary:  Negative for dysuria, frequency and urgency.  Musculoskeletal:  Negative for back pain and joint pain.  Skin:  Positive for itching and rash.  Neurological:  Negative for dizziness, tingling, focal weakness, weakness and headaches.  Endo/Heme/Allergies:  Does not bruise/bleed easily.  Psychiatric/Behavioral:  Negative for depression. The patient is not nervous/anxious and does not have insomnia.      PAST MEDICAL HISTORY :  Past Medical History:  Diagnosis Date   Anemia    iron deficiency during chemotherapy   Anxiety    Asthma    Excercise induced as a child   Fibromyalgia    Headache    migraine headaches due to sinus/allergies and teeth grinding at night   Malignant neoplasm of upper-inner quadrant of right breast in female, estrogen receptor negative (HCC)[id number]   right breast   Personal history of chemotherapy    Right breast- 4 treatments    PAST SURGICAL HISTORY :   Past Surgical History:  Procedure Laterality Date   ABDOMINAL HYSTERECTOMY     BREAST RECONSTRUCTION WITH PLACEMENT OF TISSUE EXPANDER AND FLEX HD (ACELLULAR HYDRATED DERMIS) Bilateral 09/15/2017   Procedure: BREAST RECONSTRUCTION WITH PLACEMENT OF TISSUE EXPANDER AND FLEX  HD (ACELLULAR HYDRATED DERMIS);  Surgeon: Wallace Going, DO;  Location: [facility] ORS;  Service: Plastics;  Laterality:  Bilateral;   IR IMAGING GUIDED PORT INSERTION  03/17/2017   KNEE SURGERY Left 2009   Torn meniscus    NIPPLE SPARING MASTECTOMY/SENTINAL LYMPH NODE BIOPSY/RECONSTRUCTION/PLACEMENT OF TISSUE EXPANDER Bilateral 09/15/2017   Procedure: BILATERAL NIPPLE SPARING MASTECTOMIES  WITH RIGHT  SENTINAL LYMPH NODE BIOPSY;  Surgeon: Jovita Kussmaul, MD;  Location: [facility] OR[facility];  Service: General;  Laterality: Bilateral;   PORT-A-CATH REMOVAL N/A 09/15/2017   Procedure: REMOVAL PORT-A-CATH;  Surgeon: Jovita Kussmaul, MD;  Location: [facility] OR[facility];  Service: General;  Laterality: N/A;   REMOVAL OF BILATERAL TISSUE EXPANDERS WITH PLACEMENT OF BILATERAL BREAST IMPLANTS Bilateral 04/01/2018   Procedure: REMOVAL OF BILATERAL TISSUE EXPANDERS WITH PLACEMENT OF BILATERAL BREAST IMPLANTS;  Surgeon: Wallace Going, DO;  Location: [facility] OR[facility];  Service: Plastics;  Laterality: Bilateral;   RHINOPLASTY  [id number]   both surgeries due to broken nose    FAMILY HISTORY :   Family History  Problem Relation Age of Onset   Cancer Mother        Ovarian cancer (2003), breast cancer(2017) & squamous cell    Leukemia Mother    Breast cancer Mother 94   Cancer Sister        breast cancer   Breast cancer Sister 17   Melanoma Father    Lung cancer Paternal Grandmother     SOCIAL HISTORY:   Social History   Tobacco Use   Smoking status: Former    Packs/day: 0.25    Types: Cigarettes    Quit date: 09/16/2011    Years since quitting: 9.1   Smokeless tobacco: Never  Vaping Use   Vaping Use: Never used  Substance Use Topics   Alcohol use: No   Drug use: No    ALLERGIES:  has No Known Allergies.  MEDICATIONS:  Current Outpatient Medications  Medication Sig Dispense Refill   acetaminophen (TYLENOL) 325 MG tablet Take 1 tablet (325 mg total) by mouth every 6 (six) hours.     cromolyn (NASALCROM) 5.2 MG/ACT nasal spray Place 1 spray into both nostrils 4 (four) times daily as needed for allergies or rhinitis. 26 mL 0    loratadine (CLARITIN) 10 MG tablet Take 10 mg daily by mouth.     Multiple Vitamin (MULTIVITAMIN WITH MINERALS) TABS tablet Take 1 tablet by mouth daily. One-A-Day     naproxen (NAPROSYN) 500 MG tablet Take 1 tablet (500 mg total) by mouth 2 (two) times daily as needed for mild pain.     sertraline (ZOLOFT) 100 MG tablet Take 1 tablet (100 mg total) by mouth at bedtime. 30 tablet 4   No current facility-administered medications for this visit.    PHYSICAL EXAMINATION: ECOG PERFORMANCE STATUS: 0 - Asymptomatic  BP 93/66 (BP Location: Left Arm, Patient Position: Sitting, Cuff Size: Normal)   Pulse 77   Temp (!) 97.4 F (36.3 C) (Tympanic)   Resp 16   Ht '5\' 6"'  (1.676 m)   Wt 148 lb 12.8 oz (67.5 kg)   LMP 05/28/2017 (Approximate) Comment: no periods since start of chemo  SpO2 99%   BMI 24.02 kg/m   Filed Weights   11/22/20 1337  Weight: 148 lb 12.8 oz (67.5 kg)    Physical Exam Constitutional:      Comments: Is alone.  HENT:     Head: Normocephalic and atraumatic.     Mouth/Throat:  Pharynx: No oropharyngeal exudate.  Eyes:     Pupils: Pupils are equal, round, and reactive to light.  Cardiovascular:     Rate and Rhythm: Normal rate and regular rhythm.  Pulmonary:     Effort: No respiratory distress.     Breath sounds: No wheezing.  Abdominal:     General: Bowel sounds are normal. There is no distension.     Palpations: Abdomen is soft. There is no mass.     Tenderness: There is no abdominal tenderness. There is no guarding or rebound.  Musculoskeletal:        General: No tenderness. Normal range of motion.     Cervical back: Normal range of motion and neck supple.  Skin:    General: Skin is warm.     Comments: Bilateral chest wall exam [s/p bilateral mastectomies]-no concerns for any local recurrence; reconstructed breast.  Mild erythema-upper outer quadrant of right nipple.  No lumps or bumps felt.  Neurological:     Mental Status: She is alert and oriented to  person, place, and time.  Psychiatric:        Mood and Affect: Affect normal.   LABORATORY DATA:  I have reviewed the data as listed    Component Value Date/Time   NA 135 11/22/2020 1323   K 4.2 11/22/2020 1323   CL 102 11/22/2020 1323   CO2 27 11/22/2020 1323   GLUCOSE 91 11/22/2020 1323   BUN 14 11/22/2020 1323   CREATININE 0.72 11/22/2020 1323   CALCIUM 8.9 11/22/2020 1323   PROT 7.2 11/22/2020 1323   ALBUMIN 4.3 11/22/2020 1323   AST 16 11/22/2020 1323   ALT 10 11/22/2020 1323   ALKPHOS 73 11/22/2020 1323   BILITOT 0.1 (L) 11/22/2020 1323   GFRNONAA >60 11/22/2020 1323   GFRAA >60 10/20/2019 1304    No results found for: SPEP, UPEP  Lab Results  Component Value Date   WBC 6.7 11/22/2020   NEUTROABS 3.9 11/22/2020   HGB 12.9 11/22/2020   HCT 39.6 11/22/2020   MCV 95.4 11/22/2020   PLT 257 11/22/2020      Chemistry      Component Value Date/Time   NA 135 11/22/2020 1323   K 4.2 11/22/2020 1323   CL 102 11/22/2020 1323   CO2 27 11/22/2020 1323   BUN 14 11/22/2020 1323   CREATININE 0.72 11/22/2020 1323      Component Value Date/Time   CALCIUM 8.9 11/22/2020 1323   ALKPHOS 73 11/22/2020 1323   AST 16 11/22/2020 1323   ALT 10 11/22/2020 1323   BILITOT 0.1 (L) 11/22/2020 1323       RADIOGRAPHIC STUDIES: I have personally reviewed the radiological images as listed and agreed with the findings in the report. No results found.   ASSESSMENT & PLAN:  Malignant neoplasm of upper-inner quadrant of right breast in female, estrogen receptor negative ([place]) # Stage I triple negative breast cancer; post neoadjuvant chemotherapy- 6 mm residual disease.  Adjuvant Xeloda-  S/p cycle #8 [finished May 03, 2018]. SEP 2020- CT C/A/P- NED.   # Clinically no evidence of disease noted-stable.  #Mild erythema around the right nipple/no mass felt.  Seems superficial monitor for now.  # Insomnia--on melatonin-  STABLE.    # bone health-recommend continue calcium with  vitamin D. Patient is currently postmenopausal.  Discussed regarding bone density; monitor for now.  Patient is physically active.  # DISPOSITION:  # follow up in 6 months; MD; cbc/cmp;Dr.B  Orders Placed This Encounter  Procedures   CBC with Differential/Platelet    Standing Status:   Future    Standing Expiration Date:   11/22/2021   Comprehensive metabolic panel    Standing Status:   Future    Standing Expiration Date:   11/22/2021   All questions were answered. The patient knows to call the clinic with any problems, questions or concerns.      Cammie Sickle, MD 11/22/2020 2:32 PM

## 2021-03-15 ENCOUNTER — Telehealth: Payer: Self-pay | Admitting: Medical Oncology

## 2021-03-15 NOTE — Telephone Encounter (Signed)
WF [id number] UPBEAT: Follow-up Phone Call, Year 4  [facility] with patient informing her of year 4 follow up call regarding study and any cardiac events. Requested patient call me at her earliest convenience. Return call information provided. Patient thanked. Maxwell Marion, RN, BSN, [facility] Clinical Research Nurse Lead 03/15/2021 4:02 PM

## 2021-05-04 ENCOUNTER — Telehealth: Payer: Self-pay | Admitting: Medical Oncology

## 2021-05-04 NOTE — Telephone Encounter (Signed)
WF [id number] Understanding and Predicting Breast Cancer Events after Treatment (UPBEAT)  Outgoing call: Year 4 Follow-up  Call to patient regarding her follow-up year for study. Introduced myself to patient and inquired if she had a few minutes to complete the year 4 study cardiomyopathy questionnaire and the Cardiovascular Event Form. Patient confirms no hospitalizations and no cardiac events in the past year. We completed the Cardiomyopathy questionnaire with me reading the questions to her and with patient providing me the responses, in which I checked the appropriate box that reflects her response. Patient denied having any questions at this time. Patient was thanked for her time and continued support of study.  Patient encouraged to call Dr.Brahmandy or research should she have any questions.   Maxwell Marion, RN, BSN, [facility] Clinical Research Nurse Lead 05/04/2021 2:35 PM

## 2021-05-18 DIAGNOSIS — C4491 Basal cell carcinoma of skin, unspecified: Secondary | ICD-10-CM

## 2021-05-18 HISTORY — DX: Basal cell carcinoma of skin, unspecified: C44.91

## 2021-05-21 ENCOUNTER — Telehealth: Payer: Self-pay | Admitting: Internal Medicine

## 2021-05-21 NOTE — Telephone Encounter (Signed)
done

## 2021-05-21 NOTE — Telephone Encounter (Signed)
Pt called to reschedule her appt for 1-25. Call back at [phone]

## 2021-05-23 ENCOUNTER — Inpatient Hospital Stay: Admitting: Internal Medicine

## 2021-05-23 ENCOUNTER — Inpatient Hospital Stay

## 2021-06-01 ENCOUNTER — Other Ambulatory Visit: Payer: Self-pay

## 2021-06-01 ENCOUNTER — Inpatient Hospital Stay: Attending: Internal Medicine

## 2021-06-01 ENCOUNTER — Encounter: Payer: Self-pay | Admitting: Internal Medicine

## 2021-06-01 ENCOUNTER — Inpatient Hospital Stay (HOSPITAL_BASED_OUTPATIENT_CLINIC_OR_DEPARTMENT_OTHER): Admitting: Internal Medicine

## 2021-06-01 DIAGNOSIS — Z801 Family history of malignant neoplasm of trachea, bronchus and lung: Secondary | ICD-10-CM | POA: Insufficient documentation

## 2021-06-01 DIAGNOSIS — Z8041 Family history of malignant neoplasm of ovary: Secondary | ICD-10-CM | POA: Diagnosis not present

## 2021-06-01 DIAGNOSIS — C50211 Malignant neoplasm of upper-inner quadrant of right female breast: Secondary | ICD-10-CM | POA: Insufficient documentation

## 2021-06-01 DIAGNOSIS — Z808 Family history of malignant neoplasm of other organs or systems: Secondary | ICD-10-CM | POA: Insufficient documentation

## 2021-06-01 DIAGNOSIS — Z79899 Other long term (current) drug therapy: Secondary | ICD-10-CM | POA: Insufficient documentation

## 2021-06-01 DIAGNOSIS — Z806 Family history of leukemia: Secondary | ICD-10-CM | POA: Diagnosis not present

## 2021-06-01 DIAGNOSIS — Z803 Family history of malignant neoplasm of breast: Secondary | ICD-10-CM | POA: Insufficient documentation

## 2021-06-01 DIAGNOSIS — Z171 Estrogen receptor negative status [ER-]: Secondary | ICD-10-CM

## 2021-06-01 DIAGNOSIS — Z87891 Personal history of nicotine dependence: Secondary | ICD-10-CM | POA: Insufficient documentation

## 2021-06-01 DIAGNOSIS — Z90722 Acquired absence of ovaries, bilateral: Secondary | ICD-10-CM | POA: Diagnosis not present

## 2021-06-01 DIAGNOSIS — G4700 Insomnia, unspecified: Secondary | ICD-10-CM | POA: Insufficient documentation

## 2021-06-01 DIAGNOSIS — Z85828 Personal history of other malignant neoplasm of skin: Secondary | ICD-10-CM | POA: Insufficient documentation

## 2021-06-01 DIAGNOSIS — Z9221 Personal history of antineoplastic chemotherapy: Secondary | ICD-10-CM | POA: Insufficient documentation

## 2021-06-01 LAB — COMPREHENSIVE METABOLIC PANEL
ALT: 11 U/L (ref 0–44)
AST: 17 U/L (ref 15–41)
Albumin: 4.3 g/dL (ref 3.5–5.0)
Alkaline Phosphatase: 68 U/L (ref 38–126)
Anion gap: 6 (ref 5–15)
BUN: 13 mg/dL (ref 6–20)
CO2: 28 mmol/L (ref 22–32)
Calcium: 8.9 mg/dL (ref 8.9–10.3)
Chloride: 101 mmol/L (ref 98–111)
Creatinine, Ser: 0.79 mg/dL (ref 0.44–1.00)
GFR, Estimated: >60 mL/min (ref >60)
Glucose, Bld: 79 mg/dL (ref 70–99)
Potassium: 4 mmol/L (ref 3.5–5.1)
Sodium: 135 mmol/L (ref 135–145)
Total Bilirubin: 0.2 mg/dL — ABNORMAL LOW (ref 0.3–1.2)
Total Protein: 7.3 g/dL (ref 6.5–8.1)

## 2021-06-01 LAB — CBC WITH DIFFERENTIAL/PLATELET
Abs Immature Granulocytes: 0.02 10*3/uL (ref 0.00–0.07)
Basophils Absolute: 0.1 10*3/uL (ref 0.0–0.1)
Basophils Relative: 1 %
Eosinophils Absolute: 0.4 10*3/uL (ref 0.0–0.5)
Eosinophils Relative: 6 %
HCT: 37.9 % (ref 36.0–46.0)
Hemoglobin: 12.6 g/dL (ref 12.0–15.0)
Immature Granulocytes: 0 %
Lymphocytes Relative: 26 %
Lymphs Abs: 1.6 10*3/uL (ref 0.7–4.0)
MCH: 31.1 pg (ref 26.0–34.0)
MCHC: 33.2 g/dL (ref 30.0–36.0)
MCV: 93.6 fL (ref 80.0–100.0)
Monocytes Absolute: 0.5 10*3/uL (ref 0.1–1.0)
Monocytes Relative: 9 %
Neutro Abs: 3.4 10*3/uL (ref 1.7–7.7)
Neutrophils Relative %: 58 %
Platelets: 266 10*3/uL (ref 150–400)
RBC: 4.05 MIL/uL (ref 3.87–5.11)
RDW: 12.1 % (ref 11.5–15.5)
WBC: 6 10*3/uL (ref 4.0–10.5)
nRBC: 0 % (ref 0.0–0.2)

## 2021-06-01 NOTE — Assessment & Plan Note (Addendum)
#  Stage I triple negative breast cancer [# BRCA-1 s/p BSO+ ]; post neoadjuvant chemotherapy- 6 mm residual disease.  Adjuvant Xeloda-  S/p cycle #8 [finished May 03, 2018]. SEP 2020- CT C/A/P- NED; Clinically no evidence of disease noted-stable.  Not recommending further imaging/tumor markers.  # BCC- s/p excision awaiting Mohs surgery [[place]]-  # Insomnia--on melatonin-  STABLE.    # bone health-recommend continue calcium with vitamin D. Patient is currently postmenopausal.  Discussed regarding bone density; monitor for now.  Patient is physically active.  # DISPOSITION:  # follow up in 6 months; MD; cbc/cmp;Dr.B

## 2021-06-01 NOTE — Progress Notes (Signed)
[facility] OFFICE PROGRESS NOTE  Patient Care Team: Serita Grammes, MD as PCP - General (Family Medicine) Magrinat, Virgie Dad, MD (Inactive) as Consulting Physician (Oncology) Marga Hoots, MD (Obstetrics and Gynecology) Dillingham, Loel Lofty, DO as Attending Physician (Plastic Surgery) Coralie Keens, MD as Consulting Physician (General Surgery) Cammie Sickle, MD as Consulting Physician (Internal Medicine) Rico Junker, RN as Oncology Nurse Navigator Jovita Kussmaul, MD as Consulting Physician (General Surgery)   Cancer Staging  No matching staging information was found for the patient.   Oncology History Overview Note  # OCT 2018- RIGHT BREAST UIQ -[place]; TRIPLE NEGATIVE; ki-67-high [4m-1'O clock-Bx- proven; [place]]; another- 12'O clock-452m Not biopsied. [s/p Dr.Magrinaut; [place]]; cT1c(m) cN0;  G-3; ki-67- 80%  # 11/218- ddACesolved;  x4 cyles- US/ammo- improved 15m70mothe leison-r-Taxol+carbo [finished [place] April 2019]  # MAY 2019- s/p BILATERAL Mastec& SLNBx [Dr.Toth; [place]]; 6mm49msidual ca; 3 SLN-NEG [ypT1bsnN0]; s/p 8 cycles of xeloda [finished jan 5th 2020]  # BRCA-1 Gene mutated [mom-ovarian cancer-Stage IIIB/Brca; older sister- breast ca/brca-mutated]  # MUGA scan [nov [id number]]%; UPBEAT clinical trial; port explanted.   # TAH &BSO [s/p Oct 29th/Dr.Pearson; breast recon reconstruction- dec 3rd]   DIAGNOSIS: TRIPLE NEGATIVE BREAST CA  STAGE:  I   ; GOALS: CURATIVE  CURRENT/MOST RECENT THERAPY: surveillaince    Malignant neoplasm of upper-inner quadrant of right breast in female, estrogen receptor negative (HCC)[name]   INTERVAL HISTORY: Alone.  Ambulating independently.  Holly Parker.  female pleasant patient above history of stage I triple negative breast cancer currently on surveillance is here for follow-up.  No nausea no vomiting.  No headaches.  No new lumps or bumps.  In the interim patient had a basal cell  carcinoma taken off her right face.  Review of Systems  Constitutional:  Negative for chills, diaphoresis, fever, malaise/fatigue and weight loss.  HENT:  Negative for nosebleeds and sore throat.   Eyes:  Negative for double vision.  Respiratory:  Negative for cough, hemoptysis, sputum production, shortness of breath and wheezing.   Cardiovascular:  Negative for chest pain, palpitations, orthopnea and leg swelling.  Gastrointestinal:  Negative for abdominal pain, blood in stool, constipation, diarrhea, heartburn, melena, nausea and vomiting.  Genitourinary:  Negative for dysuria, frequency and urgency.  Musculoskeletal:  Negative for back pain and joint pain.  Neurological:  Negative for dizziness, tingling, focal weakness, weakness and headaches.  Endo/Heme/Allergies:  Does not bruise/bleed easily.  Psychiatric/Behavioral:  Negative for depression. The patient is not nervous/anxious and does not have insomnia.      PAST MEDICAL HISTORY :  Past Medical History:  Diagnosis Date   Anemia    iron deficiency during chemotherapy   Anxiety    Asthma    Excercise induced as a child   Carcinoma, basal cell, skin 05/18/2021   lt upper bridge of nose   Fibromyalgia    Headache    migraine headaches due to sinus/allergies and teeth grinding at night   Malignant neoplasm of upper-inner quadrant of right breast in female, estrogen receptor negative (HCC)Holly Parker   right breast   Personal history of chemotherapy    Right breast- 4 treatments    PAST SURGICAL HISTORY :   Past Surgical History:  Procedure Laterality Date   ABDOMINAL HYSTERECTOMY     BREAST RECONSTRUCTION WITH PLACEMENT OF TISSUE EXPANDER AND FLEX HD (ACELLULAR HYDRATED DERMIS) Bilateral 09/15/2017   Procedure: BREAST RECONSTRUCTION WITH PLACEMENT OF TISSUE EXPANDER  AND FLEX HD (ACELLULAR HYDRATED DERMIS);  Surgeon: Wallace Going, DO;  Location: [facility] ORS;  Service: Plastics;  Laterality: Bilateral;   IR IMAGING GUIDED PORT  INSERTION  03/17/2017   KNEE SURGERY Left 2009   Torn meniscus    NIPPLE SPARING MASTECTOMY/SENTINAL LYMPH NODE BIOPSY/RECONSTRUCTION/PLACEMENT OF TISSUE EXPANDER Bilateral 09/15/2017   Procedure: BILATERAL NIPPLE SPARING MASTECTOMIES  WITH RIGHT  SENTINAL LYMPH NODE BIOPSY;  Surgeon: Jovita Kussmaul, MD;  Location: [facility];  Service: General;  Laterality: Bilateral;   PORT-A-CATH REMOVAL N/A 09/15/2017   Procedure: REMOVAL PORT-A-CATH;  Surgeon: Jovita Kussmaul, MD;  Location: [facility];  Service: General;  Laterality: N/A;   REMOVAL OF BILATERAL TISSUE EXPANDERS WITH PLACEMENT OF BILATERAL BREAST IMPLANTS Bilateral 04/01/2018   Procedure: REMOVAL OF BILATERAL TISSUE EXPANDERS WITH PLACEMENT OF BILATERAL BREAST IMPLANTS;  Surgeon: Wallace Going, DO;  Location: [facility];  Service: Plastics;  Laterality: Bilateral;   RHINOPLASTY  [id number]   both surgeries due to broken nose    FAMILY HISTORY :   Family History  Problem Relation Age of Onset   Cancer Mother        Ovarian cancer (2003), breast cancer(2017) & squamous cell    Leukemia Mother    Breast cancer Mother 37   Cancer Sister        breast cancer   Breast cancer Sister 73   Melanoma Father    Lung cancer Paternal Grandmother     SOCIAL HISTORY:   Social History   Tobacco Use   Smoking status: Former    Packs/day: 0.25    Types: Cigarettes    Quit date: 09/16/2011    Years since quitting: 9.7   Smokeless tobacco: Never  Vaping Use   Vaping Use: Never used  Substance Use Topics   Alcohol use: No   Drug use: No    ALLERGIES:  has No Known Allergies.  MEDICATIONS:  Current Outpatient Medications  Medication Sig Dispense Refill   acetaminophen (TYLENOL) 325 MG tablet Take 1 tablet (325 mg total) by mouth every 6 (six) hours.     cromolyn (NASALCROM) 5.2 MG/ACT nasal spray Place 1 spray into both nostrils 4 (four) times daily as needed for allergies or rhinitis. 26 mL 0   loratadine (CLARITIN) 10 MG tablet Take 10  mg daily by mouth.     Multiple Vitamin (MULTIVITAMIN WITH MINERALS) TABS tablet Take 1 tablet by mouth daily. One-A-Day     naproxen (NAPROSYN) 500 MG tablet Take 1 tablet (500 mg total) by mouth 2 (two) times daily as needed for mild pain.     sertraline (ZOLOFT) 100 MG tablet Take 1 tablet (100 mg total) by mouth at bedtime. 30 tablet 4   No current facility-administered medications for this visit.    PHYSICAL EXAMINATION: ECOG PERFORMANCE STATUS: 0 - Asymptomatic  BP 107/68 (BP Location: Left Arm, Patient Position: Sitting, Cuff Size: Normal)    Pulse 72    Temp 97.7 F (36.5 C) (Temporal)    Wt 139 lb 9.6 oz (63.3 kg)    LMP 05/28/2017 (Approximate) Comment: no periods since start of chemo   SpO2 100%    BMI 22.53 kg/m   Filed Weights   06/01/21 0908  Weight: 139 lb 9.6 oz (63.3 kg)    Physical Exam Constitutional:      Comments: Is alone.  HENT:     Head: Normocephalic and atraumatic.     Mouth/Throat:     Pharynx: No oropharyngeal exudate.  Eyes:     Pupils: Pupils are equal, round, and reactive to light.  Cardiovascular:     Rate and Rhythm: Normal rate and regular rhythm.  Pulmonary:     Effort: No respiratory distress.     Breath sounds: No wheezing.  Abdominal:     General: Bowel sounds are normal. There is no distension.     Palpations: Abdomen is soft. There is no mass.     Tenderness: There is no abdominal tenderness. There is no guarding or rebound.  Musculoskeletal:        General: No tenderness. Normal range of motion.     Cervical back: Normal range of motion and neck supple.  Skin:    General: Skin is warm.     Comments: Bilateral chest wall exam [s/p bilateral mastectomies]-no concerns for any local recurrence; reconstructed breast.  Mild erythema-upper outer quadrant of right nipple.  No lumps or bumps felt.  Neurological:     Mental Status: Holly Parker is alert and oriented to person, place, and time.  Psychiatric:        Mood and Affect: Affect normal.    LABORATORY DATA:  I have reviewed the data as listed    Component Value Date/Time   NA 135 06/01/2021 0901   K 4.0 06/01/2021 0901   CL 101 06/01/2021 0901   CO2 28 06/01/2021 0901   GLUCOSE 79 06/01/2021 0901   BUN 13 06/01/2021 0901   CREATININE 0.79 06/01/2021 0901   CALCIUM 8.9 06/01/2021 0901   PROT 7.3 06/01/2021 0901   ALBUMIN 4.3 06/01/2021 0901   AST 17 06/01/2021 0901   ALT 11 06/01/2021 0901   ALKPHOS 68 06/01/2021 0901   BILITOT 0.2 (L) 06/01/2021 0901   GFRNONAA >60 06/01/2021 0901   GFRAA >60 10/20/2019 1304    No results found for: SPEP, UPEP  Lab Results  Component Value Date   WBC 6.0 06/01/2021   NEUTROABS 3.4 06/01/2021   HGB 12.6 06/01/2021   HCT 37.9 06/01/2021   MCV 93.6 06/01/2021   PLT 266 06/01/2021      Chemistry      Component Value Date/Time   NA 135 06/01/2021 0901   K 4.0 06/01/2021 0901   CL 101 06/01/2021 0901   CO2 28 06/01/2021 0901   BUN 13 06/01/2021 0901   CREATININE 0.79 06/01/2021 0901      Component Value Date/Time   CALCIUM 8.9 06/01/2021 0901   ALKPHOS 68 06/01/2021 0901   AST 17 06/01/2021 0901   ALT 11 06/01/2021 0901   BILITOT 0.2 (L) 06/01/2021 0901       RADIOGRAPHIC STUDIES: I have personally reviewed the radiological images as listed and agreed with the findings in the report. No results found.   ASSESSMENT & PLAN:  Malignant neoplasm of upper-inner quadrant of right breast in female, estrogen receptor negative ([place]) # Stage I triple negative breast cancer [# BRCA-1 s/p BSO+ ]; post neoadjuvant chemotherapy- 6 mm residual disease.  Adjuvant Xeloda-  S/p cycle #8 [finished May 03, 2018]. SEP 2020- CT C/A/P- NED; Clinically no evidence of disease noted-stable.  Not recommending further imaging/tumor markers.  # BCC- s/p excision awaiting Mohs surgery [[place]]-  # Insomnia--on melatonin-  STABLE.    # bone health-recommend continue calcium with vitamin D. Patient is currently postmenopausal.  Discussed  regarding bone density; monitor for now.  Patient is physically active.  # DISPOSITION:  # follow up in 6 months; MD; cbc/cmp;Dr.B     Orders Placed  This Encounter  Procedures   CBC with Differential/Platelet    Standing Status:   Future    Standing Expiration Date:   06/01/2022   Comprehensive metabolic panel    Standing Status:   Future    Standing Expiration Date:   06/01/2022   All questions were answered. The patient knows to call the clinic with any problems, questions or concerns.      Cammie Sickle, MD 06/01/2021 5:16 PM

## 2021-11-21 ENCOUNTER — Ambulatory Visit: Admitting: Allergy

## 2021-11-30 ENCOUNTER — Inpatient Hospital Stay (HOSPITAL_BASED_OUTPATIENT_CLINIC_OR_DEPARTMENT_OTHER): Admitting: Internal Medicine

## 2021-11-30 ENCOUNTER — Encounter: Payer: Self-pay | Admitting: Internal Medicine

## 2021-11-30 ENCOUNTER — Inpatient Hospital Stay: Attending: Internal Medicine

## 2021-11-30 DIAGNOSIS — Z85828 Personal history of other malignant neoplasm of skin: Secondary | ICD-10-CM | POA: Diagnosis not present

## 2021-11-30 DIAGNOSIS — Z79899 Other long term (current) drug therapy: Secondary | ICD-10-CM | POA: Insufficient documentation

## 2021-11-30 DIAGNOSIS — Z87891 Personal history of nicotine dependence: Secondary | ICD-10-CM | POA: Diagnosis not present

## 2021-11-30 DIAGNOSIS — Z171 Estrogen receptor negative status [ER-]: Secondary | ICD-10-CM | POA: Insufficient documentation

## 2021-11-30 DIAGNOSIS — C50211 Malignant neoplasm of upper-inner quadrant of right female breast: Secondary | ICD-10-CM | POA: Insufficient documentation

## 2021-11-30 DIAGNOSIS — Z806 Family history of leukemia: Secondary | ICD-10-CM | POA: Insufficient documentation

## 2021-11-30 DIAGNOSIS — Z808 Family history of malignant neoplasm of other organs or systems: Secondary | ICD-10-CM | POA: Diagnosis not present

## 2021-11-30 DIAGNOSIS — Z90722 Acquired absence of ovaries, bilateral: Secondary | ICD-10-CM | POA: Diagnosis not present

## 2021-11-30 DIAGNOSIS — Z801 Family history of malignant neoplasm of trachea, bronchus and lung: Secondary | ICD-10-CM | POA: Insufficient documentation

## 2021-11-30 DIAGNOSIS — Z8041 Family history of malignant neoplasm of ovary: Secondary | ICD-10-CM | POA: Diagnosis not present

## 2021-11-30 DIAGNOSIS — Z803 Family history of malignant neoplasm of breast: Secondary | ICD-10-CM | POA: Insufficient documentation

## 2021-11-30 LAB — COMPREHENSIVE METABOLIC PANEL
ALT: 13 U/L (ref 0–44)
AST: 21 U/L (ref 15–41)
Albumin: 4.1 g/dL (ref 3.5–5.0)
Alkaline Phosphatase: 83 U/L (ref 38–126)
Anion gap: 8 (ref 5–15)
BUN: 13 mg/dL (ref 6–20)
CO2: 28 mmol/L (ref 22–32)
Calcium: 9.3 mg/dL (ref 8.9–10.3)
Chloride: 100 mmol/L (ref 98–111)
Creatinine, Ser: 0.67 mg/dL (ref 0.44–1.00)
GFR, Estimated: >60 mL/min (ref >60)
Glucose, Bld: 89 mg/dL (ref 70–99)
Potassium: 4.5 mmol/L (ref 3.5–5.1)
Sodium: 136 mmol/L (ref 135–145)
Total Bilirubin: 0.4 mg/dL (ref 0.3–1.2)
Total Protein: 8 g/dL (ref 6.5–8.1)

## 2021-11-30 LAB — CBC WITH DIFFERENTIAL/PLATELET
Abs Immature Granulocytes: 0.03 10*3/uL (ref 0.00–0.07)
Basophils Absolute: 0.1 10*3/uL (ref 0.0–0.1)
Basophils Relative: 1 %
Eosinophils Absolute: 0.2 10*3/uL (ref 0.0–0.5)
Eosinophils Relative: 3 %
HCT: 38.9 % (ref 36.0–46.0)
Hemoglobin: 12.8 g/dL (ref 12.0–15.0)
Immature Granulocytes: 0 %
Lymphocytes Relative: 31 %
Lymphs Abs: 2.1 10*3/uL (ref 0.7–4.0)
MCH: 30.5 pg (ref 26.0–34.0)
MCHC: 32.9 g/dL (ref 30.0–36.0)
MCV: 92.6 fL (ref 80.0–100.0)
Monocytes Absolute: 0.6 10*3/uL (ref 0.1–1.0)
Monocytes Relative: 8 %
Neutro Abs: 3.9 10*3/uL (ref 1.7–7.7)
Neutrophils Relative %: 57 %
Platelets: 364 10*3/uL (ref 150–400)
RBC: 4.2 MIL/uL (ref 3.87–5.11)
RDW: 11.9 % (ref 11.5–15.5)
WBC: 6.9 10*3/uL (ref 4.0–10.5)
nRBC: 0 % (ref 0.0–0.2)

## 2021-11-30 NOTE — Assessment & Plan Note (Addendum)
#  Stage I triple negative breast cancer [# BRCA-1 s/p BSO+ ]; post neoadjuvant chemotherapy- 6 mm residual disease.  Adjuvant Xeloda-  S/p cycle #8 [finished May 03, 2018]. SEP 2020- CT C/A/P- NED; Clinically no evidence of disease noted-stable.  Not recommending further imaging/tumor markers.  # BCC left side of nose-- s/p  Mohs surgery [[place]]; also taken off her left shoulder- STABLE.   # bone health-recommend continue calcium with vitamin D. Patient is currently postmenopausal.  Discussed regarding bone density; monitor for now.  Patient is physically active.  # DISPOSITION:  # follow up in 6 months; MD; cbc/cmp;Dr.B

## 2021-11-30 NOTE — Progress Notes (Signed)
[facility] OFFICE PROGRESS NOTE  Patient Care Team: Serita Grammes, MD as PCP - General (Family Medicine) Magrinat, Virgie Dad, MD (Inactive) as Consulting Physician (Oncology) Marga Hoots, MD (Obstetrics and Gynecology) Dillingham, Loel Lofty, DO as Attending Physician (Plastic Surgery) Coralie Keens, MD as Consulting Physician (General Surgery) Cammie Sickle, MD as Consulting Physician (Internal Medicine) Rico Junker, RN as Oncology Nurse Navigator Jovita Kussmaul, MD as Consulting Physician (General Surgery)   Cancer Staging  No matching staging information was found for the patient.   Oncology History Overview Note  # OCT 2018- RIGHT BREAST UIQ -[place]; TRIPLE NEGATIVE; ki-67-high [66m-1'O clock-Bx- proven; ]; another- 12'O clock-418m Not biopsied. [s/p Dr.Magrinaut; [place]]; cT1c(m) cN0;  G-3; ki-67- 80%  # 11/218- ddACesolved;  x4 cyles- US/ammo- improved 67m12mothe leison-r-Taxol+carbo [finished ealry April 2019]  # MAY 2019- s/p BILATERAL Mastec& SLNBx [Dr.Toth; [place]]; 6mm69msidual ca; 3 SLN-NEG [ypT1bsnN0]; s/p 8 cycles of xeloda [finished jan 5th 2020]  # BRCA-1 Gene mutated [mom-ovarian cancer-Stage IIIB/Brca; older sister- breast ca/brca-mutated]  # MUGA scan [nov [id number]]%; UPBEAT clinical trial; port explanted.   # TAH &BSO [s/p Oct 29th/Dr.Pearson; breast recon reconstruction- dec 3rd]   DIAGNOSIS: TRIPLE NEGATIVE BREAST CA  STAGE:  I   ; GOALS: CURATIVE  CURRENT/MOST RECENT THERAPY: surveillaince    Malignant neoplasm of upper-inner quadrant of right breast in female, estrogen receptor negative (HCC)[place]   INTERVAL HISTORY: Alone.  Ambulating independently.  Holly Parker.  female pleasant patient above history of stage I triple negative breast cancer currently on surveillance is here for follow-up.  Patient states that her father recently diagnosed with pneumonia.  However currently in the hospital for  anaphylactic shock drug-induced.  Pt is currently on AMOXICILLIN for pneumonia she believes today or tomorrow is her last day.  Mild cough overall improving.   No nausea no vomiting.  No headaches.  No new lumps or bumps.  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  HENT:  Negative for nosebleeds and sore throat.   Eyes:  Negative for double vision.  Respiratory:  Negative for cough, hemoptysis, sputum production, shortness of breath and wheezing.   Cardiovascular:  Negative for chest pain, palpitations, orthopnea and leg swelling.  Gastrointestinal:  Negative for abdominal pain, blood in stool, constipation, diarrhea, heartburn, melena, nausea and vomiting.  Genitourinary:  Negative for dysuria, frequency and urgency.  Musculoskeletal:  Negative for back pain and joint pain.  Neurological:  Negative for dizziness, tingling, focal weakness, weakness and headaches.  Endo/Heme/Allergies:  Does not bruise/bleed easily.  Psychiatric/Behavioral:  Negative for depression. The patient is not nervous/anxious and does not have insomnia.       PAST MEDICAL HISTORY :  Past Medical History:  Diagnosis Date   Anemia    iron deficiency during chemotherapy   Anxiety    Asthma    Excercise induced as a child   Carcinoma, basal cell, skin 05/18/2021   lt upper bridge of nose   Fibromyalgia    Headache    migraine headaches due to sinus/allergies and teeth grinding at night   Malignant neoplasm of upper-inner quadrant of right breast in female, estrogen receptor negative (HCC)[id number]   right breast   Personal history of chemotherapy  Right breast- 4 treatments    PAST SURGICAL HISTORY :   Past Surgical History:  Procedure Laterality Date   ABDOMINAL HYSTERECTOMY     BREAST RECONSTRUCTION  WITH PLACEMENT OF TISSUE EXPANDER AND FLEX HD (ACELLULAR HYDRATED DERMIS) Bilateral 09/15/2017   Procedure: BREAST RECONSTRUCTION WITH PLACEMENT OF TISSUE EXPANDER AND FLEX HD (ACELLULAR HYDRATED DERMIS);  Surgeon: Wallace Going, DO;  Location: [facility];  Service: Plastics;  Laterality: Bilateral;   IR IMAGING GUIDED PORT INSERTION  03/17/2017   KNEE SURGERY Left 2009   Torn meniscus    NIPPLE SPARING MASTECTOMY/SENTINAL LYMPH NODE BIOPSY/RECONSTRUCTION/PLACEMENT OF TISSUE EXPANDER Bilateral 09/15/2017   Procedure: BILATERAL NIPPLE SPARING MASTECTOMIES  WITH RIGHT  SENTINAL LYMPH NODE BIOPSY;  Surgeon: Jovita Kussmaul, MD;  Location: [facility];  Service: General;  Laterality: Bilateral;   PORT-A-CATH REMOVAL N/A 09/15/2017   Procedure: REMOVAL PORT-A-CATH;  Surgeon: Jovita Kussmaul, MD;  Location: [facility];  Service: General;  Laterality: N/A;   REMOVAL OF BILATERAL TISSUE EXPANDERS WITH PLACEMENT OF BILATERAL BREAST IMPLANTS Bilateral 04/01/2018   Procedure: REMOVAL OF BILATERAL TISSUE EXPANDERS WITH PLACEMENT OF BILATERAL BREAST IMPLANTS;  Surgeon: Wallace Going, DO;  Location: [facility];  Service: Plastics;  Laterality: Bilateral;   RHINOPLASTY  [id number]   both surgeries due to broken nose    FAMILY HISTORY :   Family History  Problem Relation Age of Onset   Cancer Mother        Ovarian cancer (2003), breast cancer(2017) & squamous cell    Leukemia Mother    Breast cancer Mother 62   Cancer Sister        breast cancer   Breast cancer Sister 8   Melanoma Father    Lung cancer Paternal Grandmother     SOCIAL HISTORY:   Social History   Tobacco Use   Smoking status: Former    Packs/day: 0.25    Types: Cigarettes    Quit date: 09/16/2011    Years since quitting: 10.2   Smokeless tobacco: Never  Vaping Use   Vaping Use: Never used  Substance Use Topics   Alcohol use: No   Drug use: No    ALLERGIES:  has No Known Allergies.  MEDICATIONS:  Current Outpatient  Medications  Medication Sig Dispense Refill   acetaminophen (TYLENOL) 325 MG tablet Take 1 tablet (325 mg total) by mouth every 6 (six) hours.     amoxicillin (AMOXIL) 500 MG capsule Take 500 mg by mouth 2 (two) times daily.     cromolyn (NASALCROM) 5.2 MG/ACT nasal spray Place 1 spray into both nostrils 4 (four) times daily as needed for allergies or rhinitis. 26 mL 0   loratadine (CLARITIN) 10 MG tablet Take 10 mg daily by mouth.     Multiple Vitamin (MULTIVITAMIN WITH MINERALS) TABS tablet Take 1 tablet by mouth daily. One-A-Day     naproxen (NAPROSYN) 500 MG tablet Take 1 tablet (500 mg total) by mouth 2 (two) times daily as needed for mild pain.     sertraline (ZOLOFT) 100 MG tablet Take 1 tablet (100 mg total) by mouth at bedtime. 30 tablet 4   triamcinolone ointment (KENALOG) 0.1 % 1 application Externally Twice a day for 30 days     No current facility-administered medications for this visit.    PHYSICAL EXAMINATION: ECOG PERFORMANCE STATUS: 0 - Asymptomatic  BP 99/65   Pulse 72   Temp 98.1 F (36.7 C)   Resp 16   Wt 135 lb 1.6  oz (61.3 kg)   LMP 05/28/2017 (Approximate) Comment: no periods since start of chemo  SpO2 99%   BMI 21.81 kg/m   Filed Weights   11/30/21 1019  Weight: 135 lb 1.6 oz (61.3 kg)    Physical Exam Constitutional:      Comments: Is alone.  HENT:     Head: Normocephalic and atraumatic.     Mouth/Throat:     Pharynx: No oropharyngeal exudate.  Eyes:     Pupils: Pupils are equal, round, and reactive to light.  Cardiovascular:     Rate and Rhythm: Normal rate and regular rhythm.  Pulmonary:     Effort: No respiratory distress.     Breath sounds: No wheezing.  Abdominal:     General: Bowel sounds are normal. There is no distension.     Palpations: Abdomen is soft. There is no mass.     Tenderness: There is no abdominal tenderness. There is no guarding or rebound.  Musculoskeletal:        General: No tenderness. Normal range of motion.      Cervical back: Normal range of motion and neck supple.  Skin:    General: Skin is warm.     Comments: Bilateral chest wall exam [s/p bilateral mastectomies]-no concerns for any local recurrence; reconstructed breast.  Mild erythema-upper outer quadrant of right nipple.  No lumps or bumps felt.  Neurological:     Mental Status: She is alert and oriented to person, place, and time.  Psychiatric:        Mood and Affect: Affect normal.    LABORATORY DATA:  I have reviewed the data as listed    Component Value Date/Time   NA 136 11/30/2021 1002   K 4.5 11/30/2021 1002   CL 100 11/30/2021 1002   CO2 28 11/30/2021 1002   GLUCOSE 89 11/30/2021 1002   BUN 13 11/30/2021 1002   CREATININE 0.67 11/30/2021 1002   CALCIUM 9.3 11/30/2021 1002   PROT 8.0 11/30/2021 1002   ALBUMIN 4.1 11/30/2021 1002   AST 21 11/30/2021 1002   ALT 13 11/30/2021 1002   ALKPHOS 83 11/30/2021 1002   BILITOT 0.4 11/30/2021 1002   GFRNONAA >60 11/30/2021 1002   GFRAA >60 10/20/2019 1304    No results found for: "SPEP", "UPEP"  Lab Results  Component Value Date   WBC 6.9 11/30/2021   NEUTROABS 3.9 11/30/2021   HGB 12.8 11/30/2021   HCT 38.9 11/30/2021   MCV 92.6 11/30/2021   PLT 364 11/30/2021      Chemistry      Component Value Date/Time   NA 136 11/30/2021 1002   K 4.5 11/30/2021 1002   CL 100 11/30/2021 1002   CO2 28 11/30/2021 1002   BUN 13 11/30/2021 1002   CREATININE 0.67 11/30/2021 1002      Component Value Date/Time   CALCIUM 9.3 11/30/2021 1002   ALKPHOS 83 11/30/2021 1002   AST 21 11/30/2021 1002   ALT 13 11/30/2021 1002   BILITOT 0.4 11/30/2021 1002       RADIOGRAPHIC STUDIES: I have personally reviewed the radiological images as listed and agreed with the findings in the report. No results found.   ASSESSMENT & PLAN:  Malignant neoplasm of upper-inner quadrant of right breast in female, estrogen receptor negative ([place]) # Stage I triple negative breast cancer [# BRCA-1 s/p  BSO+ ]; post neoadjuvant chemotherapy- 6 mm residual disease.  Adjuvant Xeloda-  S/p cycle #8 [finished May 03, 2018]. SEP 2020-  CT C/A/P- NED; Clinically no evidence of disease noted-stable.  Not recommending further imaging/tumor markers.  # BCC left side of nose-- s/p  Mohs surgery [[place]]; also taken off her left shoulder- STABLE.   # bone health-recommend continue calcium with vitamin D. Patient is currently postmenopausal.  Discussed regarding bone density; monitor for now.  Patient is physically active.  # DISPOSITION:  # follow up in 6 months; MD; cbc/cmp;Dr.B     Orders Placed This Encounter  Procedures   CBC with Differential/Platelet    Standing Status:   Future    Standing Expiration Date:   12/01/2022   Comprehensive metabolic panel    Standing Status:   Future    Standing Expiration Date:   12/01/2022   All questions were answered. The patient knows to call the clinic with any problems, questions or concerns.      Cammie Sickle, MD 11/30/2021 10:34 AM

## 2021-11-30 NOTE — Progress Notes (Signed)
Pt is currently on AMOXICILLIN for pneumonia she believes today or tomorrow is her last day.

## 2021-12-18 NOTE — Progress Notes (Deleted)
NEW PATIENT Date of Service/Encounter:  12/18/21 Referring provider: Serita Grammes, MD Primary care provider: Serita Grammes, MD  Subjective:  Holly Parker is a 41 y.o. female with a PMHx of BRCA1 + breast cancer s/p mastectomy s/p chemotherapy, currently in surveillance presenting today for evaluation of "allergies".  History obtained from: chart review and {Persons; PED relatives w/patient:19415::"patient"}.   Chronic rhinitis: started *** Symptoms include: {Blank multiple:19196:a:"***","nasal congestion","rhinorrhea","post nasal drainage","sneezing","watery eyes","itchy eyes","itchy nose"}  Occurs {Blank single:19197::"year-round","seasonally-***","year-round with seasonal flares","***"} Potential triggers: *** Treatments tried: *** Previous allergy testing: {Blank single:19197::"yes","no"} History of reflux/heartburn: {Blank single:19197::"yes","no"} ENT evaluation/surgeries: ***  Other allergy screening: Asthma: {Blank single:19197::"yes","no"} Rhino conjunctivitis: {Blank single:19197::"yes","no"} Food allergy: {Blank single:19197::"yes","no"} Medication allergy: {Blank single:19197::"yes","no"} Hymenoptera allergy: {Blank single:19197::"yes","no"} Urticaria: {Blank single:19197::"yes","no"} Eczema:{Blank single:19197::"yes","no"} History of recurrent infections suggestive of immunodeficency: {Blank single:19197::"yes","no"} ***Vaccinations are up to date.   Past Medical History: Past Medical History:  Diagnosis Date   Anemia    iron deficiency during chemotherapy   Anxiety    Asthma    Excercise induced as a child   Carcinoma, basal cell, skin 05/18/2021   lt upper bridge of nose   Fibromyalgia    Headache    migraine headaches due to sinus/allergies and teeth grinding at night   Malignant neoplasm of upper-inner quadrant of right breast in female, estrogen receptor negative ([place]) 2018   right breast   Personal history of chemotherapy    Right  breast- 4 treatments   Medication List:  Current Outpatient Medications  Medication Sig Dispense Refill   acetaminophen (TYLENOL) 325 MG tablet Take 1 tablet (325 mg total) by mouth every 6 (six) hours.     amoxicillin (AMOXIL) 500 MG capsule Take 500 mg by mouth 2 (two) times daily.     cromolyn (NASALCROM) 5.2 MG/ACT nasal spray Place 1 spray into both nostrils 4 (four) times daily as needed for allergies or rhinitis. 26 mL 0   loratadine (CLARITIN) 10 MG tablet Take 10 mg daily by mouth.     Multiple Vitamin (MULTIVITAMIN WITH MINERALS) TABS tablet Take 1 tablet by mouth daily. One-A-Day     naproxen (NAPROSYN) 500 MG tablet Take 1 tablet (500 mg total) by mouth 2 (two) times daily as needed for mild pain.     sertraline (ZOLOFT) 100 MG tablet Take 1 tablet (100 mg total) by mouth at bedtime. 30 tablet 4   triamcinolone ointment (KENALOG) 0.1 % 1 application Externally Twice a day for 30 days     No current facility-administered medications for this visit.   Known Allergies:  No Known Allergies Past Surgical History: Past Surgical History:  Procedure Laterality Date   ABDOMINAL HYSTERECTOMY     BREAST RECONSTRUCTION WITH PLACEMENT OF TISSUE EXPANDER AND FLEX HD (ACELLULAR HYDRATED DERMIS) Bilateral 09/15/2017   Procedure: BREAST RECONSTRUCTION WITH PLACEMENT OF TISSUE EXPANDER AND FLEX HD (ACELLULAR HYDRATED DERMIS);  Surgeon: Wallace Going, DO;  Location: [facility];  Service: Plastics;  Laterality: Bilateral;   IR IMAGING GUIDED PORT INSERTION  03/17/2017   KNEE SURGERY Left 2009   Torn meniscus    NIPPLE SPARING MASTECTOMY/SENTINAL LYMPH NODE BIOPSY/RECONSTRUCTION/PLACEMENT OF TISSUE EXPANDER Bilateral 09/15/2017   Procedure: BILATERAL NIPPLE SPARING MASTECTOMIES  WITH RIGHT  SENTINAL LYMPH NODE BIOPSY;  Surgeon: Jovita Kussmaul, MD;  Location: [facility];  Service: General;  Laterality: Bilateral;   PORT-A-CATH REMOVAL N/A 09/15/2017   Procedure: REMOVAL PORT-A-CATH;  Surgeon:  Jovita Kussmaul, MD;  Location: [facility];  Service: General;  Laterality:  N/A;   REMOVAL OF BILATERAL TISSUE EXPANDERS WITH PLACEMENT OF BILATERAL BREAST IMPLANTS Bilateral 04/01/2018   Procedure: REMOVAL OF BILATERAL TISSUE EXPANDERS WITH PLACEMENT OF BILATERAL BREAST IMPLANTS;  Surgeon: Wallace Going, DO;  Location: [facility];  Service: Plastics;  Laterality: Bilateral;   RHINOPLASTY  [id number]   both surgeries due to broken nose   Family History: Family History  Problem Relation Age of Onset   Cancer Mother        Ovarian cancer (2003), breast cancer(2017) & squamous cell    Leukemia Mother    Breast cancer Mother 21   Cancer Sister        breast cancer   Breast cancer Sister 54   Melanoma Father    Lung cancer Paternal Grandmother    Social History: Esra lives ***.   ROS:  All other systems negative except as noted per HPI.  Objective:  Last menstrual period 05/28/2017. There is no height or weight on file to calculate BMI. Physical Exam:  General Appearance:  Alert, cooperative, no distress, appears stated age  Head:  Normocephalic, without obvious abnormality, atraumatic  Eyes:  Conjunctiva clear, EOM's intact  Nose: Nares normal, {Blank multiple:19196:a:"***","hypertrophic turbinates","normal mucosa","no visible anterior polyps","septum midline"}  Throat: Lips, tongue normal; teeth and gums normal, {Blank multiple:19196:a:"***","normal posterior oropharynx","tonsils 2+","tonsils 3+","no tonsillar exudate","+ cobblestoning"}  Neck: Supple, symmetrical  Lungs:   {Blank multiple:19196:a:"***","clear to auscultation bilaterally","end-expiratory wheezing","wheezing throughout"}, Respirations unlabored, {Blank multiple:19196:a:"***","no coughing","intermittent dry coughing"}  Heart:  {Blank multiple:19196:a:"***","regular rate and rhythm","no murmur"}, Appears well perfused  Extremities: No edema  Skin: Skin color, texture, turgor normal, no rashes or lesions on  visualized portions of skin  Neurologic: No gross deficits     Diagnostics: Spirometry:  Tracings reviewed. Her effort: {Blank single:19197::"Good reproducible efforts.","It was hard to get consistent efforts and there is a question as to whether this reflects a maximal maneuver.","Poor effort, data can not be interpreted.","Variable effort-results affected.","decent for first attempt at spirometry."} FVC: ***L (pre), ***L  (post) FEV1: ***L, ***% predicted (pre), ***L, ***% predicted (post) FEV1/FVC ratio: ***% (pre), ***% (post) Interpretation: {Blank single:19197::"Spirometry consistent with mild obstructive disease","Spirometry consistent with moderate obstructive disease","Spirometry consistent with severe obstructive disease","Spirometry consistent with possible restrictive disease","Spirometry consistent with mixed obstructive and restrictive disease","Spirometry uninterpretable due to technique","Spirometry consistent with normal pattern","No overt abnormalities noted given today's efforts"} with *** bronchodilator response  Skin Testing: {Blank single:19197::"Select foods","Environmental allergy panel","Environmental allergy panel and select foods","Food allergy panel","None","Deferred due to recent antihistamines use"}. *** Adequate controls. Results discussed with patient/family.   {Blank single:19197::"Allergy testing results were read and interpreted by myself, documented by clinical staff."," "}  Assessment and Plan  ***  {Blank single:19197::"This note in its entirety was forwarded to the Provider who requested this consultation."}  Thank you for your kind referral. I appreciate the opportunity to take part in Rosealee's care. Please do not hesitate to contact me with questions.***  Sincerely,  Sigurd Sos, MD [facility]

## 2021-12-20 ENCOUNTER — Ambulatory Visit: Admitting: Internal Medicine

## 2022-03-05 ENCOUNTER — Telehealth: Payer: Self-pay | Admitting: Medical Oncology

## 2022-03-05 NOTE — Telephone Encounter (Signed)
[id number] Understanding and Predicting Breast Cancer Events after Treatment (UPBEAT)   Outgoing call: 48  LVMOM with patient regarding year 5 follow-up on study. Informed patient I will email her the yearly questionnaire and asked patient to return call at her earliest convenience. Patient thanked, my call back number provided.   Maxwell Marion, RN, BSN, [place] Clinical Research Nurse Lead 03/05/2022 2:23 PM

## 2022-05-07 ENCOUNTER — Telehealth: Payer: Self-pay | Admitting: Medical Oncology

## 2022-05-07 NOTE — Telephone Encounter (Signed)
[id number] Understanding and Predicting Breast Cancer Events after Treatment (UPBEAT)   Outgoing call: 28  LVMOM with patient regarding follow-up for study year 5. Patient was called in November and VM left with no response. Patient was informed that study questionnaires were sent to her via email to complete. Patient was asked to return call at earliest convenience for completion of this year 5, prior to January 20th. Patient thanked for her time.    Maxwell Marion, RN, BSN, [place] Clinical Research Nurse Lead 05/07/2022 11:55 AM

## 2022-06-17 ENCOUNTER — Encounter: Payer: Self-pay | Admitting: Medical Oncology

## 2022-06-17 ENCOUNTER — Encounter: Payer: Self-pay | Admitting: Internal Medicine

## 2022-06-17 ENCOUNTER — Inpatient Hospital Stay (HOSPITAL_BASED_OUTPATIENT_CLINIC_OR_DEPARTMENT_OTHER): Admitting: Internal Medicine

## 2022-06-17 ENCOUNTER — Inpatient Hospital Stay: Attending: Internal Medicine

## 2022-06-17 VITALS — BP 107/72 | HR 77 | Resp 18 | Ht 66.0 in | Wt 139.0 lb

## 2022-06-17 DIAGNOSIS — R0981 Nasal congestion: Secondary | ICD-10-CM | POA: Diagnosis not present

## 2022-06-17 DIAGNOSIS — Z90710 Acquired absence of both cervix and uterus: Secondary | ICD-10-CM | POA: Insufficient documentation

## 2022-06-17 DIAGNOSIS — Z90722 Acquired absence of ovaries, bilateral: Secondary | ICD-10-CM | POA: Insufficient documentation

## 2022-06-17 DIAGNOSIS — C50211 Malignant neoplasm of upper-inner quadrant of right female breast: Secondary | ICD-10-CM

## 2022-06-17 DIAGNOSIS — Z85828 Personal history of other malignant neoplasm of skin: Secondary | ICD-10-CM | POA: Insufficient documentation

## 2022-06-17 DIAGNOSIS — Z7189 Other specified counseling: Secondary | ICD-10-CM | POA: Insufficient documentation

## 2022-06-17 DIAGNOSIS — Z808 Family history of malignant neoplasm of other organs or systems: Secondary | ICD-10-CM | POA: Insufficient documentation

## 2022-06-17 DIAGNOSIS — Z801 Family history of malignant neoplasm of trachea, bronchus and lung: Secondary | ICD-10-CM | POA: Insufficient documentation

## 2022-06-17 DIAGNOSIS — Z79899 Other long term (current) drug therapy: Secondary | ICD-10-CM | POA: Diagnosis not present

## 2022-06-17 DIAGNOSIS — Z832 Family history of diseases of the blood and blood-forming organs and certain disorders involving the immune mechanism: Secondary | ICD-10-CM | POA: Diagnosis not present

## 2022-06-17 DIAGNOSIS — Z809 Family history of malignant neoplasm, unspecified: Secondary | ICD-10-CM | POA: Insufficient documentation

## 2022-06-17 DIAGNOSIS — Z171 Estrogen receptor negative status [ER-]: Secondary | ICD-10-CM

## 2022-06-17 DIAGNOSIS — Z87891 Personal history of nicotine dependence: Secondary | ICD-10-CM | POA: Diagnosis not present

## 2022-06-17 DIAGNOSIS — Z8041 Family history of malignant neoplasm of ovary: Secondary | ICD-10-CM | POA: Insufficient documentation

## 2022-06-17 DIAGNOSIS — L814 Other melanin hyperpigmentation: Secondary | ICD-10-CM | POA: Insufficient documentation

## 2022-06-17 DIAGNOSIS — Z803 Family history of malignant neoplasm of breast: Secondary | ICD-10-CM | POA: Insufficient documentation

## 2022-06-17 DIAGNOSIS — Z9221 Personal history of antineoplastic chemotherapy: Secondary | ICD-10-CM | POA: Diagnosis not present

## 2022-06-17 HISTORY — DX: Other melanin hyperpigmentation: L81.4

## 2022-06-17 LAB — CBC WITH DIFFERENTIAL/PLATELET
Abs Immature Granulocytes: 0.04 10*3/uL (ref 0.00–0.07)
Basophils Absolute: 0.1 10*3/uL (ref 0.0–0.1)
Basophils Relative: 1 %
Eosinophils Absolute: 0 10*3/uL (ref 0.0–0.5)
Eosinophils Relative: 0 %
HCT: 40.4 % (ref 36.0–46.0)
Hemoglobin: 13.4 g/dL (ref 12.0–15.0)
Immature Granulocytes: 1 %
Lymphocytes Relative: 15 %
Lymphs Abs: 1.3 10*3/uL (ref 0.7–4.0)
MCH: 30.9 pg (ref 26.0–34.0)
MCHC: 33.2 g/dL (ref 30.0–36.0)
MCV: 93.3 fL (ref 80.0–100.0)
Monocytes Absolute: 0.2 10*3/uL (ref 0.1–1.0)
Monocytes Relative: 3 %
Neutro Abs: 7.1 10*3/uL (ref 1.7–7.7)
Neutrophils Relative %: 80 %
Platelets: 325 10*3/uL (ref 150–400)
RBC: 4.33 MIL/uL (ref 3.87–5.11)
RDW: 12 % (ref 11.5–15.5)
WBC: 8.7 10*3/uL (ref 4.0–10.5)
nRBC: 0 % (ref 0.0–0.2)

## 2022-06-17 LAB — COMPREHENSIVE METABOLIC PANEL
ALT: 12 U/L (ref 0–44)
AST: 17 U/L (ref 15–41)
Albumin: 4.5 g/dL (ref 3.5–5.0)
Alkaline Phosphatase: 75 U/L (ref 38–126)
Anion gap: 9 (ref 5–15)
BUN: 20 mg/dL (ref 6–20)
CO2: 25 mmol/L (ref 22–32)
Calcium: 9.3 mg/dL (ref 8.9–10.3)
Chloride: 100 mmol/L (ref 98–111)
Creatinine, Ser: 0.79 mg/dL (ref 0.44–1.00)
GFR, Estimated: >60 mL/min (ref >60)
Glucose, Bld: 111 mg/dL — ABNORMAL HIGH (ref 70–99)
Potassium: 4.4 mmol/L (ref 3.5–5.1)
Sodium: 134 mmol/L — ABNORMAL LOW (ref 135–145)
Total Bilirubin: 0.4 mg/dL (ref 0.3–1.2)
Total Protein: 8.3 g/dL — ABNORMAL HIGH (ref 6.5–8.1)

## 2022-06-17 NOTE — Progress Notes (Signed)
[facility] OFFICE PROGRESS NOTE  Patient Care Team: Serita Grammes, MD as PCP - General (Family Medicine) Magrinat, Virgie Dad, MD (Inactive) as Consulting Physician (Oncology) Holly Hoots, MD (Obstetrics and Gynecology) Dillingham, Loel Lofty, DO as Attending Physician (Plastic Surgery) Holly Keens, MD as Consulting Physician (General Surgery) Holly Sickle, MD as Consulting Physician (Internal Medicine) Rico Junker, RN as Oncology Nurse Navigator Holly Kussmaul, MD as Consulting Physician (General Surgery)   Cancer Staging  No matching staging information was found for the patient.   Oncology History Overview Note  # OCT 2018- RIGHT BREAST UIQ -[facility]; TRIPLE NEGATIVE; ki-67-high [40m-1'O clock-Bx- proven; [place]]; another- 12'O clock-441m Not biopsied. [s/p Dr.Magrinaut; [place]]; cT1c(m) cN0;  G-3; ki-67- 80%  # 11/218- ddACesolved;  x4 cyles- US/ammo- improved 74m80mothe leison-r-Taxol+carbo [finished [place] April 2019]  # MAY 2019- s/p BILATERAL Mastec& SLNBx [Dr.Toth; [place]]; 6mm51msidual ca; 3 SLN-NEG [ypT1bsnN0]; s/p 8 cycles of xeloda [finished jan 5th 2020]  # BRCA-1 Gene mutated [mom-ovarian cancer-Stage IIIB/Brca; older sister- breast ca/brca-mutated]  # MUGA scan [nov [id number]%; UPBEAT clinical trial; port explanted.   # TAH &BSO [s/p Oct 29th/Dr.Pearson; breast recon reconstruction- dec 3rd]   DIAGNOSIS: TRIPLE NEGATIVE BREAST CA  STAGE:  I   ; GOALS: CURATIVE  CURRENT/MOST RECENT THERAPY: surveillaince    Malignant neoplasm of upper-inner quadrant of right breast in female, estrogen receptor negative (HCC)Holly Parker  INTERVAL HISTORY: Alone.  Ambulating independently.  [name].  female pleasant patient above history of stage I triple negative breast cancer currently on surveillance is here for follow-up.  Complains of sinus congestion; stuffy nose.  On z-pack.  Otherwise no fever or chills.  Symptoms  improving.  No nausea no vomiting.  No headaches.  No new lumps or bumps.  Review of Systems  Constitutional:  Negative for chills, diaphoresis, fever, malaise/fatigue and weight loss.  HENT:  Negative for nosebleeds and sore throat.   Eyes:  Negative for double vision.  Respiratory:  Negative for cough, hemoptysis, sputum production, shortness of breath and wheezing.   Cardiovascular:  Negative for chest pain, palpitations, orthopnea and leg swelling.  Gastrointestinal:  Negative for abdominal pain, blood in stool, constipation, diarrhea, heartburn, melena, nausea and vomiting.  Genitourinary:  Negative for dysuria, frequency and urgency.  Musculoskeletal:  Negative for back pain and joint pain.  Neurological:  Negative for dizziness, tingling, focal weakness, weakness and headaches.  Endo/Heme/Allergies:  Does not bruise/bleed easily.  Psychiatric/Behavioral:  Negative for depression. The patient is not nervous/anxious and does not have insomnia.       PAST MEDICAL HISTORY :  Past Medical History:  Diagnosis Date   Anemia    iron deficiency during chemotherapy   Anxiety    Asthma    Excercise induced as a child   Carcinoma, basal cell, skin 05/18/2021   lt upper bridge of nose   Fibromyalgia    Headache    migraine headaches due to sinus/allergies and teeth grinding at night   Lentigo 06/17/2022   Malignant neoplasm of upper-inner quadrant of right breast in female, estrogen receptor negative (HCC)[place]18   right breast   Personal history of chemotherapy    Right breast- 4 treatments    PAST SURGICAL HISTORY :   Past Surgical History:  Procedure Laterality Date   ABDOMINAL HYSTERECTOMY     BREAST RECONSTRUCTION WITH PLACEMENT OF TISSUE EXPANDER AND FLEX HD (ACELLULAR HYDRATED DERMIS) Bilateral 09/15/2017  Procedure: BREAST RECONSTRUCTION WITH PLACEMENT OF TISSUE EXPANDER AND FLEX HD (ACELLULAR HYDRATED DERMIS);  Surgeon: Holly Going, DO;  Location: [facility];   Service: Plastics;  Laterality: Bilateral;   IR IMAGING GUIDED PORT INSERTION  03/17/2017   KNEE SURGERY Left 2009   Torn meniscus    NIPPLE SPARING MASTECTOMY/SENTINAL LYMPH NODE BIOPSY/RECONSTRUCTION/PLACEMENT OF TISSUE EXPANDER Bilateral 09/15/2017   Procedure: BILATERAL NIPPLE SPARING MASTECTOMIES  WITH RIGHT  SENTINAL LYMPH NODE BIOPSY;  Surgeon: Holly Kussmaul, MD;  Location: [facility];  Service: General;  Laterality: Bilateral;   PORT-A-CATH REMOVAL N/A 09/15/2017   Procedure: REMOVAL PORT-A-CATH;  Surgeon: Holly Kussmaul, MD;  Location: [facility];  Service: General;  Laterality: N/A;   REMOVAL OF BILATERAL TISSUE EXPANDERS WITH PLACEMENT OF BILATERAL BREAST IMPLANTS Bilateral 04/01/2018   Procedure: REMOVAL OF BILATERAL TISSUE EXPANDERS WITH PLACEMENT OF BILATERAL BREAST IMPLANTS;  Surgeon: Holly Going, DO;  Location: [facility];  Service: Plastics;  Laterality: Bilateral;   RHINOPLASTY  [id number]   both surgeries due to broken nose    FAMILY HISTORY :   Family History  Problem Relation Age of Onset   Cancer Mother        Ovarian cancer (2003), breast cancer(2017) & squamous cell    Leukemia Mother    Breast cancer Mother 60   Cancer Sister        breast cancer   Breast cancer Sister 51   Melanoma Father    Lung cancer Paternal Grandmother     SOCIAL HISTORY:   Social History   Tobacco Use   Smoking status: Former    Packs/day: 0.25    Types: Cigarettes    Quit date: 09/16/2011    Years since quitting: 10.7   Smokeless tobacco: Never  Vaping Use   Vaping Use: Never used  Substance Use Topics   Alcohol use: No   Drug use: No    ALLERGIES:  has No Known Allergies.  MEDICATIONS:  Current Outpatient Medications  Medication Sig Dispense Refill   acetaminophen (TYLENOL) 325 MG tablet Take 1 tablet (325 mg total) by mouth every 6 (six) hours.     cromolyn (NASALCROM) 5.2 MG/ACT nasal spray Place 1 spray into both nostrils 4 (four) times daily as needed for  allergies or rhinitis. 26 mL 0   loratadine (CLARITIN) 10 MG tablet Take 10 mg daily by mouth.     Multiple Vitamin (MULTIVITAMIN WITH MINERALS) TABS tablet Take 1 tablet by mouth daily. One-A-Day     naproxen (NAPROSYN) 500 MG tablet Take 1 tablet (500 mg total) by mouth 2 (two) times daily as needed for mild pain.     Phenylephrine-Ibuprofen (ADVIL SINUS CONGESTION & PAIN) 10-200 MG TABS Advil Sinus Congestion & Pain     sertraline (ZOLOFT) 100 MG tablet Take 1 tablet (100 mg total) by mouth at bedtime. 30 tablet 4   amoxicillin (AMOXIL) 500 MG capsule Take 500 mg by mouth 2 (two) times daily. (Patient not taking: Reported on 06/17/2022)     No current facility-administered medications for this visit.    PHYSICAL EXAMINATION: ECOG PERFORMANCE STATUS: 0 - Asymptomatic  BP 107/72 (BP Location: Left Arm, Patient Position: Sitting)   Pulse 77   Resp 18   Ht 5' 6"$  (1.676 m)   Wt 139 lb (63 kg)   LMP 05/28/2017 (Approximate) Comment: no periods since start of chemo  SpO2 97%   BMI 22.44 kg/m   Filed Weights   06/17/22 1534  Weight: 139  lb (63 kg)    Physical Exam Constitutional:      Comments: Is alone.  HENT:     Head: Normocephalic and atraumatic.     Mouth/Throat:     Pharynx: No oropharyngeal exudate.  Eyes:     Pupils: Pupils are equal, round, and reactive to light.  Cardiovascular:     Rate and Rhythm: Normal rate and regular rhythm.  Pulmonary:     Effort: No respiratory distress.     Breath sounds: No wheezing.  Abdominal:     General: Bowel sounds are normal. There is no distension.     Palpations: Abdomen is soft. There is no mass.     Tenderness: There is no abdominal tenderness. There is no guarding or rebound.  Musculoskeletal:        General: No tenderness. Normal range of motion.     Cervical back: Normal range of motion and neck supple.  Skin:    General: Skin is warm.  Neurological:     Mental Status: She is alert and oriented to person, place, and  time.  Psychiatric:        Mood and Affect: Affect normal.    LABORATORY DATA:  I have reviewed the data as listed    Component Value Date/Time   NA 134 (L) 06/17/2022 1520   K 4.4 06/17/2022 1520   CL 100 06/17/2022 1520   CO2 25 06/17/2022 1520   GLUCOSE 111 (H) 06/17/2022 1520   BUN 20 06/17/2022 1520   CREATININE 0.79 06/17/2022 1520   CALCIUM 9.3 06/17/2022 1520   PROT 8.3 (H) 06/17/2022 1520   ALBUMIN 4.5 06/17/2022 1520   AST 17 06/17/2022 1520   ALT 12 06/17/2022 1520   ALKPHOS 75 06/17/2022 1520   BILITOT 0.4 06/17/2022 1520   GFRNONAA >60 06/17/2022 1520   GFRAA >60 10/20/2019 1304    No results found for: "SPEP", "UPEP"  Lab Results  Component Value Date   WBC 8.7 06/17/2022   NEUTROABS 7.1 06/17/2022   HGB 13.4 06/17/2022   HCT 40.4 06/17/2022   MCV 93.3 06/17/2022   PLT 325 06/17/2022      Chemistry      Component Value Date/Time   NA 134 (L) 06/17/2022 1520   K 4.4 06/17/2022 1520   CL 100 06/17/2022 1520   CO2 25 06/17/2022 1520   BUN 20 06/17/2022 1520   CREATININE 0.79 06/17/2022 1520      Component Value Date/Time   CALCIUM 9.3 06/17/2022 1520   ALKPHOS 75 06/17/2022 1520   AST 17 06/17/2022 1520   ALT 12 06/17/2022 1520   BILITOT 0.4 06/17/2022 1520       RADIOGRAPHIC STUDIES: I have personally reviewed the radiological images as listed and agreed with the findings in the report. No results found.   ASSESSMENT & PLAN:  Malignant neoplasm of upper-inner quadrant of right breast in female, estrogen receptor negative ([place]) # Stage I triple negative breast cancer [# BRCA-1 s/p BSO+ ]; post neoadjuvant chemotherapy- 6 mm residual disease.  Adjuvant Xeloda-  S/p cycle #8 [finished May 03, 2018]. SEP 2020- CT C/A/P- NED; Clinically no evidence of disease noted-stable.  Not recommending further imaging/tumor markers. stable  # BCC left side of nose-- s/p  Mohs surgery [[place]]; also taken off her left shoulder- stable  # bone  health-recommend continue calcium with vitamin D. Patient is currently postmenopausal [TAH+BSO].  Again discussed regarding bone density.  She will talk to her mother/family.  Otherwise monitor  for now.  Patient is physically active.-  # Mildly elevated protein 8.3-question related to sinus congestion inflammation.  Monitor for now.   # DISPOSITION:  # follow up in 6 months; MD; cbc/cmp;vit D 25-OH levels;Dr.B  Orders Placed This Encounter  Procedures   CBC with Differential ([place] Only)    Standing Status:   Future    Standing Expiration Date:   06/18/2023   CMP ([place] only)    Standing Status:   Future    Standing Expiration Date:   06/18/2023   VITAMIN D 25 Hydroxy (Vit-D Deficiency, Fractures)    Standing Status:   Future    Standing Expiration Date:   06/18/2023   All questions were answered. The patient knows to call the clinic with any problems, questions or concerns.      Holly Sickle, MD 06/17/2022 4:03 PM

## 2022-06-17 NOTE — Research (Signed)
WF [id number] Understanding and Predicting Breast Cancer Events after Treatment (UPBEAT)   Study follow-up: 5 year  Patient in clinic for routine visit with MD.  I met with patient briefly, while she was in clinic, to follow-up with her for her 5 year study follow-up. After several attempts to reach patient for this study visit with no success, I met with her this afternoon to complete this follow-up. Patient informed me that she completed the study questionnaire ([id number]) on-line approximately 1-2 weeks. I inquired with patient if she had been hospitalized in the past year for any cardiac events and patient states she has not, and has not had any hospitalizations. 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 I thanked patient for her time and her continued support of study and inquired if she had any questions. Patient denied any questions. Patient encouraged to call research for any questions/concerns.   Maxwell Marion, RN, BSN, [facility] Clinical Research Nurse Lead 06/17/2022 4:03 PM

## 2022-06-17 NOTE — Assessment & Plan Note (Addendum)
#   Stage I triple negative breast cancer [# BRCA-1 s/p BSO+ ]; post neoadjuvant chemotherapy- 6 mm residual disease.  Adjuvant Xeloda-  S/p cycle #8 [finished May 03, 2018]. SEP 2020- CT C/A/P- NED; Clinically no evidence of disease noted-stable.  Not recommending further imaging/tumor markers. stable  # BCC left side of nose-- s/p  Mohs surgery [[place]]; also taken off her left shoulder- stable  # bone health-recommend continue calcium with vitamin D. Patient is currently postmenopausal [TAH+BSO].  Again discussed regarding bone density.  She will talk to her mother/family.  Otherwise monitor for now.  Patient is physically active.-  # Mildly elevated protein 8.3-question related to sinus congestion inflammation.  Monitor for now.   # DISPOSITION:  # follow up in 6 months; MD; cbc/cmp;vit D 25-OH levels;Dr.B

## 2022-12-11 ENCOUNTER — Encounter: Payer: Self-pay | Admitting: Internal Medicine

## 2022-12-11 ENCOUNTER — Inpatient Hospital Stay: Attending: Internal Medicine

## 2022-12-11 ENCOUNTER — Inpatient Hospital Stay (HOSPITAL_BASED_OUTPATIENT_CLINIC_OR_DEPARTMENT_OTHER): Admitting: Internal Medicine

## 2022-12-11 VITALS — BP 111/68 | HR 71 | Temp 97.6°F | Ht 66.0 in | Wt 140.4 lb

## 2022-12-11 DIAGNOSIS — J329 Chronic sinusitis, unspecified: Secondary | ICD-10-CM | POA: Diagnosis not present

## 2022-12-11 DIAGNOSIS — Z171 Estrogen receptor negative status [ER-]: Secondary | ICD-10-CM | POA: Diagnosis not present

## 2022-12-11 DIAGNOSIS — C50211 Malignant neoplasm of upper-inner quadrant of right female breast: Secondary | ICD-10-CM

## 2022-12-11 DIAGNOSIS — Z803 Family history of malignant neoplasm of breast: Secondary | ICD-10-CM | POA: Insufficient documentation

## 2022-12-11 DIAGNOSIS — Z79899 Other long term (current) drug therapy: Secondary | ICD-10-CM | POA: Insufficient documentation

## 2022-12-11 DIAGNOSIS — Z85828 Personal history of other malignant neoplasm of skin: Secondary | ICD-10-CM | POA: Insufficient documentation

## 2022-12-11 DIAGNOSIS — Z90722 Acquired absence of ovaries, bilateral: Secondary | ICD-10-CM | POA: Insufficient documentation

## 2022-12-11 DIAGNOSIS — Z801 Family history of malignant neoplasm of trachea, bronchus and lung: Secondary | ICD-10-CM | POA: Insufficient documentation

## 2022-12-11 DIAGNOSIS — Z87891 Personal history of nicotine dependence: Secondary | ICD-10-CM | POA: Insufficient documentation

## 2022-12-11 DIAGNOSIS — Z8041 Family history of malignant neoplasm of ovary: Secondary | ICD-10-CM | POA: Insufficient documentation

## 2022-12-11 DIAGNOSIS — Z806 Family history of leukemia: Secondary | ICD-10-CM | POA: Insufficient documentation

## 2022-12-11 DIAGNOSIS — R0602 Shortness of breath: Secondary | ICD-10-CM | POA: Diagnosis not present

## 2022-12-11 DIAGNOSIS — Z9013 Acquired absence of bilateral breasts and nipples: Secondary | ICD-10-CM | POA: Diagnosis not present

## 2022-12-11 DIAGNOSIS — Z808 Family history of malignant neoplasm of other organs or systems: Secondary | ICD-10-CM | POA: Diagnosis not present

## 2022-12-11 DIAGNOSIS — Z90710 Acquired absence of both cervix and uterus: Secondary | ICD-10-CM | POA: Insufficient documentation

## 2022-12-11 LAB — CMP (CANCER CENTER ONLY)
ALT: 12 U/L (ref 0–44)
AST: 19 U/L (ref 15–41)
Albumin: 4.4 g/dL (ref 3.5–5.0)
Alkaline Phosphatase: 69 U/L (ref 38–126)
Anion gap: 9 (ref 5–15)
BUN: 13 mg/dL (ref 6–20)
CO2: 27 mmol/L (ref 22–32)
Calcium: 9.4 mg/dL (ref 8.9–10.3)
Chloride: 98 mmol/L (ref 98–111)
Creatinine: 0.66 mg/dL (ref 0.44–1.00)
GFR, Estimated: >60 mL/min (ref >60)
Glucose, Bld: 76 mg/dL (ref 70–99)
Potassium: 4.3 mmol/L (ref 3.5–5.1)
Sodium: 134 mmol/L — ABNORMAL LOW (ref 135–145)
Total Bilirubin: 0.6 mg/dL (ref 0.3–1.2)
Total Protein: 7.8 g/dL (ref 6.5–8.1)

## 2022-12-11 LAB — CBC WITH DIFFERENTIAL (CANCER CENTER ONLY)
Abs Immature Granulocytes: 0.02 10*3/uL (ref 0.00–0.07)
Basophils Absolute: 0.1 10*3/uL (ref 0.0–0.1)
Basophils Relative: 1 %
Eosinophils Absolute: 0.3 10*3/uL (ref 0.0–0.5)
Eosinophils Relative: 5 %
HCT: 39.7 % (ref 36.0–46.0)
Hemoglobin: 13.1 g/dL (ref 12.0–15.0)
Immature Granulocytes: 0 %
Lymphocytes Relative: 28 %
Lymphs Abs: 1.8 10*3/uL (ref 0.7–4.0)
MCH: 30.8 pg (ref 26.0–34.0)
MCHC: 33 g/dL (ref 30.0–36.0)
MCV: 93.4 fL (ref 80.0–100.0)
Monocytes Absolute: 0.6 10*3/uL (ref 0.1–1.0)
Monocytes Relative: 9 %
Neutro Abs: 3.6 10*3/uL (ref 1.7–7.7)
Neutrophils Relative %: 57 %
Platelet Count: 314 10*3/uL (ref 150–400)
RBC: 4.25 MIL/uL (ref 3.87–5.11)
RDW: 11.9 % (ref 11.5–15.5)
WBC Count: 6.3 10*3/uL (ref 4.0–10.5)
nRBC: 0 % (ref 0.0–0.2)

## 2022-12-11 LAB — VITAMIN D 25 HYDROXY (VIT D DEFICIENCY, FRACTURES): Vit D, 25-Hydroxy: 38.54 ng/mL (ref 30–100)

## 2022-12-11 NOTE — Assessment & Plan Note (Addendum)
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  Adjuvant Xeloda-  S/p cycle #8 [finished May 03, 2018]. SEP 2020- CT C/A/P- NED; BIL Mastectomy- NO mammograms. Clinically no evidence of disease noted-stable.  Not recommending further imaging/tumor markers.stable  # BCC left side of nose-- s/p  Mohs surgery [[place]]; also taken off her left shoulder- stable  # bone health-recommend continue calcium with vitamin D. Patient is currently postmenopausal [TAH+BSO].  Again discussed regarding bone density.  She will talk to her mother/family.  Otherwise monitor for now.  Patient is physically active. Stable.   # Recurrent sinusitis- referral to with ENT   # Mildly elevated protein 8.3-question related to sinus congestion inflammation.  Monitor for now.   # DISPOSITION:  # Recurrent sinusitis- referral to with ENT  # follow up in 12  months; MD; cbc/cmp;vit D 25-OH levels-Dr.B

## 2022-12-11 NOTE — Progress Notes (Signed)
[facility] OFFICE PROGRESS NOTE  Patient Care Team: Buckner Malta, MD as PCP - General (Family Medicine) Magrinat, Valentino Hue, MD (Inactive) as Consulting Physician (Oncology) Juanetta Snow, MD (Obstetrics and Gynecology) Dillingham, Alena Bills, DO as Attending Physician (Plastic Surgery) Abigail Miyamoto, MD as Consulting Physician (General Surgery) Earna Coder, MD as Consulting Physician (Internal Medicine) Jim Like, RN as Oncology Nurse Navigator Griselda Miner, MD as Consulting Physician (General Surgery)   Cancer Staging  No matching staging information was found for the patient.    Oncology History Overview Note  # OCT 2018- RIGHT BREAST UIQ -[facility]; TRIPLE NEGATIVE; ki-67-high [36mm-1'O clock-Bx- proven; [place]]; another- 12'O clock-35mm- Not biopsied. [s/p Dr.Magrinaut; [place]]; cT1c(m) cN0;  G-3; ki-67- 80%  # 11/218- ddACesolved;  x4 cyles- US/ammo- improved 7mm; othe leison-r-Taxol+carbo [finished [place] April 2019]  # MAY 2019- s/p BILATERAL Mastec& SLNBx [Dr.Toth; [place]]; 6mm residual ca; 3 SLN-NEG [ypT1bsnN0]; s/p 8 cycles of xeloda [finished jan 5th 2020]  # BRCA-1 Gene mutated [mom-ovarian cancer-Stage IIIB/Brca; older sister- breast ca/brca-mutated]  # MUGA scan [nov 2018]- 67%; UPBEAT clinical trial; port explanted.   # TAH &BSO [s/p Oct 29th/Dr.Pearson; breast recon reconstruction- dec 3rd]   DIAGNOSIS: TRIPLE NEGATIVE BREAST CA  STAGE:  I   ; GOALS: CURATIVE  CURRENT/MOST RECENT THERAPY: surveillaince    Malignant neoplasm of upper-inner quadrant of right breast in female, estrogen receptor negative (HCC)    INTERVAL HISTORY: Alone.  Ambulating independently.  Holly Parker 42 y.o.  female pleasant patient above history of stage I triple negative breast cancer currently on surveillance is here for follow-up.  Patient has some occasional trouble sleeping, takes melatonin to help.    Pt states has some SOB with her  allergies and sinuses, on an antibiotic, can't remember name.   No nausea no vomiting.  No headaches.  No new lumps or bumps.  Review of Systems  Constitutional:  Negative for chills, diaphoresis, fever, malaise/fatigue and weight loss.  HENT:  Negative for nosebleeds and sore throat.   Eyes:  Negative for double vision.  Respiratory:  Negative for cough, hemoptysis, sputum production, shortness of breath and wheezing.   Cardiovascular:  Negative for chest pain, palpitations, orthopnea and leg swelling.  Gastrointestinal:  Negative for abdominal pain, blood in stool, constipation, diarrhea, heartburn, melena, nausea and vomiting.  Genitourinary:  Negative for dysuria, frequency and urgency.  Musculoskeletal:  Negative for back pain and joint pain.  Neurological:  Negative for dizziness, tingling, focal weakness, weakness and headaches.  Endo/Heme/Allergies:  Does not bruise/bleed easily.  Psychiatric/Behavioral:  Negative for depression. The patient is not nervous/anxious and does not have insomnia.       PAST MEDICAL HISTORY :  Past Medical History:  Diagnosis Date   Anemia    iron deficiency during chemotherapy   Anxiety    Asthma    Excercise induced as a child   Carcinoma, basal cell, skin 05/18/2021   lt upper bridge of nose   Fibromyalgia    Headache    migraine headaches due to sinus/allergies and teeth grinding at night   Lentigo 06/17/2022   Malignant neoplasm of upper-inner quadrant of right breast in female, estrogen receptor negative (HCC) 2018   right breast   Personal history of chemotherapy    Right breast- 4 treatments    PAST SURGICAL HISTORY :   Past Surgical History:  Procedure Laterality Date   ABDOMINAL HYSTERECTOMY     BREAST RECONSTRUCTION WITH PLACEMENT OF  TISSUE EXPANDER AND FLEX HD (ACELLULAR HYDRATED DERMIS) Bilateral 09/15/2017   Procedure: BREAST RECONSTRUCTION WITH PLACEMENT OF TISSUE EXPANDER AND FLEX HD (ACELLULAR HYDRATED DERMIS);  Surgeon:  Peggye Form, DO;  Location: [facility] ORS;  Service: Plastics;  Laterality: Bilateral;   IR IMAGING GUIDED PORT INSERTION  03/17/2017   KNEE SURGERY Left 2009   Torn meniscus    NIPPLE SPARING MASTECTOMY/SENTINAL LYMPH NODE BIOPSY/RECONSTRUCTION/PLACEMENT OF TISSUE EXPANDER Bilateral 09/15/2017   Procedure: BILATERAL NIPPLE SPARING MASTECTOMIES  WITH RIGHT  SENTINAL LYMPH NODE BIOPSY;  Surgeon: Griselda Miner, MD;  Location: [facility];  Service: General;  Laterality: Bilateral;   PORT-A-CATH REMOVAL N/A 09/15/2017   Procedure: REMOVAL PORT-A-CATH;  Surgeon: Griselda Miner, MD;  Location: [facility];  Service: General;  Laterality: N/A;   REMOVAL OF BILATERAL TISSUE EXPANDERS WITH PLACEMENT OF BILATERAL BREAST IMPLANTS Bilateral 04/01/2018   Procedure: REMOVAL OF BILATERAL TISSUE EXPANDERS WITH PLACEMENT OF BILATERAL BREAST IMPLANTS;  Surgeon: Peggye Form, DO;  Location: [facility];  Service: Plastics;  Laterality: Bilateral;   RHINOPLASTY  [id number]   both surgeries due to broken nose    FAMILY HISTORY :   Family History  Problem Relation Age of Onset   Cancer Mother        Ovarian cancer (2003), breast cancer(2017) & squamous cell    Leukemia Mother    Breast cancer Mother 10   Cancer Sister        breast cancer   Breast cancer Sister 98   Melanoma Father    Lung cancer Paternal Grandmother     SOCIAL HISTORY:   Social History   Tobacco Use   Smoking status: Former    Current packs/day: 0.00    Types: Cigarettes    Quit date: 09/16/2011    Years since quitting: 11.2   Smokeless tobacco: Never  Vaping Use   Vaping status: Never Used  Substance Use Topics   Alcohol use: No   Drug use: No    ALLERGIES:  has No Known Allergies.  MEDICATIONS:  Current Outpatient Medications  Medication Sig Dispense Refill   acetaminophen (TYLENOL) 325 MG tablet Take 1 tablet (325 mg total) by mouth every 6 (six) hours.     cromolyn (NASALCROM) 5.2 MG/ACT nasal spray Place 1 spray  into both nostrils 4 (four) times daily as needed for allergies or rhinitis. 26 mL 0   loratadine (CLARITIN) 10 MG tablet Take 10 mg daily by mouth.     Multiple Vitamin (MULTIVITAMIN WITH MINERALS) TABS tablet Take 1 tablet by mouth daily. One-A-Day     naproxen (NAPROSYN) 500 MG tablet Take 1 tablet (500 mg total) by mouth 2 (two) times daily as needed for mild pain.     Phenylephrine-Ibuprofen (ADVIL SINUS CONGESTION & PAIN) 10-200 MG TABS Advil Sinus Congestion & Pain     sertraline (ZOLOFT) 100 MG tablet Take 1 tablet (100 mg total) by mouth at bedtime. 30 tablet 4   No current facility-administered medications for this visit.    PHYSICAL EXAMINATION: ECOG PERFORMANCE STATUS: 0 - Asymptomatic  BP 111/68 (BP Location: Left Arm, Patient Position: Sitting, Cuff Size: Normal)   Pulse 71   Temp 97.6 F (36.4 C) (Tympanic)   Ht 5\' 6"  (1.676 m)   Wt 140 lb 6.4 oz (63.7 kg)   LMP 05/28/2017 (Approximate) Comment: no periods since start of chemo  SpO2 100%   BMI 22.66 kg/m   Filed Weights   12/11/22 0955  Weight: 140 lb 6.4  oz (63.7 kg)     Physical Exam Constitutional:      Comments: Is alone.  HENT:     Head: Normocephalic and atraumatic.     Mouth/Throat:     Pharynx: No oropharyngeal exudate.  Eyes:     Pupils: Pupils are equal, round, and reactive to light.  Cardiovascular:     Rate and Rhythm: Normal rate and regular rhythm.  Pulmonary:     Effort: No respiratory distress.     Breath sounds: No wheezing.  Abdominal:     General: Bowel sounds are normal. There is no distension.     Palpations: Abdomen is soft. There is no mass.     Tenderness: There is no abdominal tenderness. There is no guarding or rebound.  Musculoskeletal:        General: No tenderness. Normal range of motion.     Cervical back: Normal range of motion and neck supple.  Skin:    General: Skin is warm.  Neurological:     Mental Status: She is alert and oriented to person, place, and time.   Psychiatric:        Mood and Affect: Affect normal.    LABORATORY DATA:  I have reviewed the data as listed    Component Value Date/Time   NA 134 (L) 06/17/2022 1520   K 4.4 06/17/2022 1520   CL 100 06/17/2022 1520   CO2 25 06/17/2022 1520   GLUCOSE 111 (H) 06/17/2022 1520   BUN 20 06/17/2022 1520   CREATININE 0.79 06/17/2022 1520   CALCIUM 9.3 06/17/2022 1520   PROT 8.3 (H) 06/17/2022 1520   ALBUMIN 4.5 06/17/2022 1520   AST 17 06/17/2022 1520   ALT 12 06/17/2022 1520   ALKPHOS 75 06/17/2022 1520   BILITOT 0.4 06/17/2022 1520   GFRNONAA >60 06/17/2022 1520   GFRAA >60 10/20/2019 1304    No results found for: "SPEP", "UPEP"  Lab Results  Component Value Date   WBC 6.3 12/11/2022   NEUTROABS 3.6 12/11/2022   HGB 13.1 12/11/2022   HCT 39.7 12/11/2022   MCV 93.4 12/11/2022   PLT 314 12/11/2022      Chemistry      Component Value Date/Time   NA 134 (L) 06/17/2022 1520   K 4.4 06/17/2022 1520   CL 100 06/17/2022 1520   CO2 25 06/17/2022 1520   BUN 20 06/17/2022 1520   CREATININE 0.79 06/17/2022 1520      Component Value Date/Time   CALCIUM 9.3 06/17/2022 1520   ALKPHOS 75 06/17/2022 1520   AST 17 06/17/2022 1520   ALT 12 06/17/2022 1520   BILITOT 0.4 06/17/2022 1520       RADIOGRAPHIC STUDIES: I have personally reviewed the radiological images as listed and agreed with the findings in the report. No results found.   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  Adjuvant Xeloda-  S/p cycle #8 [finished May 03, 2018]. SEP 2020- CT C/A/P- NED; BIL Mastectomy- NO mammograms. Clinically no evidence of disease noted-stable.  Not recommending further imaging/tumor markers.stable  # BCC left side of nose-- s/p  Mohs surgery [[place]]; also taken off her left  shoulder- stable  # bone health-recommend continue calcium with vitamin D. Patient is currently postmenopausal [TAH+BSO].  Again discussed regarding bone density.  She will talk to  her mother/family.  Otherwise monitor for now.  Patient is physically active. Stable.   # Recurrent sinusitis- referral to with ENT   # Mildly elevated protein 8.3-question related to sinus congestion inflammation.  Monitor for now.   # DISPOSITION:  # Recurrent sinusitis- referral to with ENT  # follow up in 12  months; MD; cbc/cmp;vit D 25-OH levels-Dr.B  No orders of the defined types were placed in this encounter.  All questions were answered. The patient knows to call the clinic with any problems, questions or concerns.      Earna Coder, MD 12/11/2022 10:14 AM

## 2022-12-11 NOTE — Progress Notes (Signed)
Has some occasional trouble sleeping, takes melatonin to help.  Pt states has some COB with her allergies and sinuses, on an antibiotic, can't remember name.

## 2023-02-19 ENCOUNTER — Telehealth: Payer: Self-pay | Admitting: Medical Oncology

## 2023-02-19 NOTE — Telephone Encounter (Signed)
[facility] [id number] Understanding and Predicting Breast Cancer Events after Treatment (UPBEAT)   Outgoing call: 15:43 Year 6 Follow-up   LVMOM with patient regarding study 6 year follow-up. Patient thanked for completing study questionnaires online. Informed patient calling regarding if patient had any cardiovascular events or hospitalizations in the past year.  Patient thanked. Call back number provided.   Rexene Edison, RN, BSN, CCRC Clinical Research Nurse Lead 02/19/2023 3:47 PM

## 2023-03-12 ENCOUNTER — Telehealth: Payer: Self-pay | Admitting: Medical Oncology

## 2023-03-12 NOTE — Telephone Encounter (Signed)
WF [id number] Understanding and Predicting Breast Cancer Events after Treatment (UPBEAT)   Outgoing call: 16:13 Follow-up Year 6  Outgoing call to patient regarding year 6 on study follow-up. Patient confirms to be doing well. Patient thanked for completing study online questionnaire sent to her email address. Patient confirms no hospitalizations in the past year and no cardiac events. Patient denies having any questions at this time. Patient thanked for her continued support of study and was encouraged to call with questions. Patient knows to expect call in one year's time.   Rexene Edison, RN, BSN, CCRC Clinical Research Nurse Lead 03/12/2023 4:19 PM

## 2023-11-14 ENCOUNTER — Encounter: Payer: Self-pay | Admitting: Advanced Practice Midwife

## 2023-12-11 ENCOUNTER — Ambulatory Visit: Payer: Self-pay | Admitting: Internal Medicine

## 2023-12-11 ENCOUNTER — Encounter: Payer: Self-pay | Admitting: Internal Medicine

## 2023-12-11 ENCOUNTER — Inpatient Hospital Stay: Attending: Internal Medicine

## 2023-12-11 ENCOUNTER — Inpatient Hospital Stay (HOSPITAL_BASED_OUTPATIENT_CLINIC_OR_DEPARTMENT_OTHER): Admitting: Internal Medicine

## 2023-12-11 VITALS — BP 109/62 | HR 78 | Temp 96.7°F | Resp 18 | Ht 66.0 in | Wt 145.0 lb

## 2023-12-11 DIAGNOSIS — Z90710 Acquired absence of both cervix and uterus: Secondary | ICD-10-CM | POA: Diagnosis not present

## 2023-12-11 DIAGNOSIS — Z87891 Personal history of nicotine dependence: Secondary | ICD-10-CM | POA: Insufficient documentation

## 2023-12-11 DIAGNOSIS — Z17421 Hormone receptor negative with human epidermal growth factor receptor 2 negative status: Secondary | ICD-10-CM | POA: Insufficient documentation

## 2023-12-11 DIAGNOSIS — I498 Other specified cardiac arrhythmias: Secondary | ICD-10-CM | POA: Insufficient documentation

## 2023-12-11 DIAGNOSIS — Z171 Estrogen receptor negative status [ER-]: Secondary | ICD-10-CM | POA: Insufficient documentation

## 2023-12-11 DIAGNOSIS — Z85828 Personal history of other malignant neoplasm of skin: Secondary | ICD-10-CM | POA: Diagnosis not present

## 2023-12-11 DIAGNOSIS — R0602 Shortness of breath: Secondary | ICD-10-CM | POA: Insufficient documentation

## 2023-12-11 DIAGNOSIS — Z806 Family history of leukemia: Secondary | ICD-10-CM | POA: Insufficient documentation

## 2023-12-11 DIAGNOSIS — Z79899 Other long term (current) drug therapy: Secondary | ICD-10-CM | POA: Diagnosis not present

## 2023-12-11 DIAGNOSIS — Z9221 Personal history of antineoplastic chemotherapy: Secondary | ICD-10-CM

## 2023-12-11 DIAGNOSIS — C50211 Malignant neoplasm of upper-inner quadrant of right female breast: Secondary | ICD-10-CM | POA: Diagnosis not present

## 2023-12-11 DIAGNOSIS — Z801 Family history of malignant neoplasm of trachea, bronchus and lung: Secondary | ICD-10-CM | POA: Insufficient documentation

## 2023-12-11 DIAGNOSIS — Z90722 Acquired absence of ovaries, bilateral: Secondary | ICD-10-CM | POA: Insufficient documentation

## 2023-12-11 DIAGNOSIS — Z8041 Family history of malignant neoplasm of ovary: Secondary | ICD-10-CM | POA: Diagnosis not present

## 2023-12-11 DIAGNOSIS — Z9013 Acquired absence of bilateral breasts and nipples: Secondary | ICD-10-CM | POA: Diagnosis not present

## 2023-12-11 DIAGNOSIS — Z803 Family history of malignant neoplasm of breast: Secondary | ICD-10-CM | POA: Insufficient documentation

## 2023-12-11 LAB — CBC WITH DIFFERENTIAL (CANCER CENTER ONLY)
Abs Immature Granulocytes: 0.01 K/uL (ref 0.00–0.07)
Basophils Absolute: 0.1 K/uL (ref 0.0–0.1)
Basophils Relative: 1 %
Eosinophils Absolute: 0.2 K/uL (ref 0.0–0.5)
Eosinophils Relative: 4 %
HCT: 38.3 % (ref 36.0–46.0)
Hemoglobin: 12.6 g/dL (ref 12.0–15.0)
Immature Granulocytes: 0 %
Lymphocytes Relative: 30 %
Lymphs Abs: 1.7 K/uL (ref 0.7–4.0)
MCH: 30.4 pg (ref 26.0–34.0)
MCHC: 32.9 g/dL (ref 30.0–36.0)
MCV: 92.5 fL (ref 80.0–100.0)
Monocytes Absolute: 0.4 K/uL (ref 0.1–1.0)
Monocytes Relative: 6 %
Neutro Abs: 3.4 K/uL (ref 1.7–7.7)
Neutrophils Relative %: 59 %
Platelet Count: 293 K/uL (ref 150–400)
RBC: 4.14 MIL/uL (ref 3.87–5.11)
RDW: 12.5 % (ref 11.5–15.5)
WBC Count: 5.8 K/uL (ref 4.0–10.5)
nRBC: 0 % (ref 0.0–0.2)

## 2023-12-11 LAB — CMP (CANCER CENTER ONLY)
ALT: 12 U/L (ref 0–44)
AST: 20 U/L (ref 15–41)
Albumin: 4.3 g/dL (ref 3.5–5.0)
Alkaline Phosphatase: 61 U/L (ref 38–126)
Anion gap: 9 (ref 5–15)
BUN: 17 mg/dL (ref 6–20)
CO2: 24 mmol/L (ref 22–32)
Calcium: 9.5 mg/dL (ref 8.9–10.3)
Chloride: 102 mmol/L (ref 98–111)
Creatinine: 0.67 mg/dL (ref 0.44–1.00)
GFR, Estimated: >60 mL/min (ref >60)
Glucose, Bld: 100 mg/dL — ABNORMAL HIGH (ref 70–99)
Potassium: 4.2 mmol/L (ref 3.5–5.1)
Sodium: 135 mmol/L (ref 135–145)
Total Bilirubin: 0.6 mg/dL (ref 0.0–1.2)
Total Protein: 7.7 g/dL (ref 6.5–8.1)

## 2023-12-11 LAB — VITAMIN D 25 HYDROXY (VIT D DEFICIENCY, FRACTURES): Vit D, 25-Hydroxy: 29.86 ng/mL — ABNORMAL LOW (ref 30–100)

## 2023-12-11 NOTE — Assessment & Plan Note (Addendum)
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  Adjuvant Xeloda -  S/p cycle #8 [finished May 03, 2018]. SEP 2020- CT C/A/P- NED; BIL Mastectomy- NO mammograms. Clinically no evidence of disease noted-stable.  Not recommending further imaging/tumor markers.stable  # bone health-recommend continue calcium with vitamin D . Patient is currently postmenopausal [TAH+BSO].  Again discussed regarding bone density.  She will talk to her mother/family.  Otherwise monitor for now.  Patient is physically active. Stable.   # SOB, and flutters in her heart, skipped a beat one or twice- check echo- and then consider cardiology evaluation-    # Recurrent sinusitis- awaiting referral to with ENT   # DISPOSITION:  # echo  # follow up in 12  months; MD; cbc/cmp;vit D 25-OH levels-Dr.B

## 2023-12-11 NOTE — Progress Notes (Signed)
 She is on her last day of a z-pack for a sinus infection.  Pt states she has been having occ. SOB, and flutters in her heart, skipped a beat one or twice. Has referral with ENT, no appt yet.

## 2023-12-11 NOTE — Progress Notes (Signed)
 [name] OFFICE PROGRESS NOTE  Patient Care Team: Holly Nest, MD as PCP - General (Family Medicine) Holly Lorrene RAMAN, MD (Obstetrics and Gynecology) Holly Parker, Holly RAMAN, DO as Attending Physician (Plastic Surgery) Holly Berg, MD as Consulting Physician (General Surgery) Holly Cindy SAUNDERS, MD as Consulting Physician (Oncology) Holly Jesusa HERO, RN as Oncology Nurse Navigator Holly Deward MOULD, MD as Consulting Physician (General Surgery)   Cancer Staging  No matching staging information was found for the patient.    Oncology History Overview Note  # OCT 2018- RIGHT BREAST UIQ -[facility]; TRIPLE NEGATIVE; ki-67-high [82mm-1'O clock-Bx- proven; [facility]]; another- 12'O clock-37mm- Not biopsied. [s/p Dr.Magrinaut; [place]]; cT1c(m) cN0;  G-3; ki-67- 80%  # 11/218- ddACesolved;  x4 cyles- US /ammo- improved 7mm; othe leison-r-Taxol +carbo [finished ealry April 2019]  # MAY 2019- s/p BILATERAL Mastec& SLNBx [Dr.Toth; [place]]; 6mm residual ca; 3 SLN-NEG [ypT1bsnN0]; s/p 8 cycles of xeloda  [finished jan 5th 2020]  # BRCA-1 Gene mutated [mom-ovarian cancer-Stage IIIB/Brca; older sister- breast ca/brca-mutated]  # MUGA scan [nov 2018]- 67%; UPBEAT clinical trial; port explanted.   # TAH &BSO [s/p Oct 29th/Dr.Pearson; breast recon reconstruction- dec 3rd]   DIAGNOSIS: TRIPLE NEGATIVE BREAST CA  STAGE:  I   ; GOALS: CURATIVE  CURRENT/MOST RECENT THERAPY: surveillaince    Malignant neoplasm of upper-inner quadrant of right breast in female, estrogen receptor negative (HCC)    INTERVAL HISTORY: Alone.  Ambulating independently.  Holly Parker 43 y.o.  female pleasant patient above history of stage I triple negative breast cancer currently on surveillance is here for follow-up.  She is on her last day of a z-pack for a sinus infection. Currently awaiting Has referral with ENT.   In last 6 months- patient states she has been having occ. SOB, and flutters in her  heart, skipped a beat one or twice.   No nausea no vomiting.  No headaches.  No new lumps or bumps.  Review of Systems  Constitutional:  Negative for chills, diaphoresis, fever, malaise/fatigue and weight loss.  HENT:  Negative for nosebleeds and sore throat.   Eyes:  Negative for double vision.  Respiratory:  Negative for cough, hemoptysis, sputum production, shortness of breath and wheezing.   Cardiovascular:  Negative for chest pain, palpitations, orthopnea and leg swelling.  Gastrointestinal:  Negative for abdominal pain, blood in stool, constipation, diarrhea, heartburn, melena, nausea and vomiting.  Genitourinary:  Negative for dysuria, frequency and urgency.  Musculoskeletal:  Negative for back pain and joint pain.  Neurological:  Negative for dizziness, tingling, focal weakness, weakness and headaches.  Endo/Heme/Allergies:  Does not bruise/bleed easily.  Psychiatric/Behavioral:  Negative for depression. The patient is not nervous/anxious and does not have insomnia.       PAST MEDICAL HISTORY :  Past Medical History:  Diagnosis Date   Anemia    iron deficiency during chemotherapy   Anxiety    Asthma    Excercise induced as a child   Carcinoma, basal cell, skin 05/18/2021   lt upper bridge of nose   Fibromyalgia    Headache    migraine headaches due to sinus/allergies and teeth grinding at night   Lentigo 06/17/2022   Malignant neoplasm of upper-inner quadrant of right breast in female, estrogen receptor negative (HCC) 2018   right breast   Personal history of chemotherapy    Right breast- 4 treatments    PAST SURGICAL HISTORY :   Past Surgical History:  Procedure Laterality Date   ABDOMINAL HYSTERECTOMY  BREAST RECONSTRUCTION WITH PLACEMENT OF TISSUE EXPANDER AND FLEX HD (ACELLULAR HYDRATED DERMIS) Bilateral 09/15/2017   Procedure: BREAST RECONSTRUCTION WITH PLACEMENT OF TISSUE EXPANDER AND FLEX HD (ACELLULAR HYDRATED DERMIS);  Surgeon: Holly Holly RAMAN, DO;   Location: [facility] OR[facility];  Service: Plastics;  Laterality: Bilateral;   IR IMAGING GUIDED PORT INSERTION  03/17/2017   KNEE SURGERY Left 2009   Torn meniscus    NIPPLE SPARING MASTECTOMY/SENTINAL LYMPH NODE BIOPSY/RECONSTRUCTION/PLACEMENT OF TISSUE EXPANDER Bilateral 09/15/2017   Procedure: BILATERAL NIPPLE SPARING MASTECTOMIES  WITH RIGHT  SENTINAL LYMPH NODE BIOPSY;  Surgeon: Holly Deward MOULD, MD;  Location: [facility];  Service: General;  Laterality: Bilateral;   PORT-A-CATH REMOVAL N/A 09/15/2017   Procedure: REMOVAL PORT-A-CATH;  Surgeon: Holly Deward MOULD, MD;  Location: [facility];  Service: General;  Laterality: N/A;   REMOVAL OF BILATERAL TISSUE EXPANDERS WITH PLACEMENT OF BILATERAL BREAST IMPLANTS Bilateral 04/01/2018   Procedure: REMOVAL OF BILATERAL TISSUE EXPANDERS WITH PLACEMENT OF BILATERAL BREAST IMPLANTS;  Surgeon: Holly Holly RAMAN, DO;  Location: [facility];  Service: Plastics;  Laterality: Bilateral;   RHINOPLASTY  [id number]   both surgeries due to broken nose    FAMILY HISTORY :   Family History  Problem Relation Age of Onset   Cancer Mother        Ovarian cancer (2003), breast cancer(2017) & squamous cell    Leukemia Mother    Breast cancer Mother 11   Cancer Sister        breast cancer   Breast cancer Sister 48   Melanoma Father    Lung cancer Paternal Grandmother     SOCIAL HISTORY:   Social History   Tobacco Use   Smoking status: Former    Current packs/day: 0.00    Types: Cigarettes    Quit date: 09/16/2011    Years since quitting: 12.2   Smokeless tobacco: Never  Vaping Use   Vaping status: Never Used  Substance Use Topics   Alcohol use: No   Drug use: No    ALLERGIES:  has no known allergies.  MEDICATIONS:  Current Outpatient Medications  Medication Sig Dispense Refill   acetaminophen  (TYLENOL ) 325 MG tablet Take 1 tablet (325 mg total) by mouth every 6 (six) hours.     cromolyn  (NASALCROM ) 5.2 MG/ACT nasal spray Place 1 spray into both nostrils 4 (four)  times daily as needed for allergies or rhinitis. 26 mL 0   loratadine (CLARITIN) 10 MG tablet Take 10 mg daily by mouth.     Multiple Vitamin (MULTIVITAMIN WITH MINERALS) TABS tablet Take 1 tablet by mouth daily. One-A-Day     naproxen  (NAPROSYN ) 500 MG tablet Take 1 tablet (500 mg total) by mouth 2 (two) times daily as needed for mild pain.     Phenylephrine -Ibuprofen (ADVIL SINUS CONGESTION & PAIN) 10-200 MG TABS Advil Sinus Congestion & Pain     sertraline  (ZOLOFT ) 100 MG tablet Take 1 tablet (100 mg total) by mouth at bedtime. 30 tablet 4   No current facility-administered medications for this visit.    PHYSICAL EXAMINATION: ECOG PERFORMANCE STATUS: 0 - Asymptomatic  BP 109/62 (BP Location: Left Arm, Patient Position: Sitting, Cuff Size: Normal)   Pulse 78   Temp (!) 96.7 F (35.9 C) (Tympanic)   Resp 18   Ht 5' 6 (1.676 m)   Wt 145 lb (65.8 kg)   LMP 05/28/2017 (Approximate) Comment: no periods since start of chemo  SpO2 100%   BMI 23.40 kg/m   [employer]  12/11/23 1005  Weight: 145 lb (65.8 kg)     Physical Exam Constitutional:      Comments: Is alone.  HENT:     Head: Normocephalic and atraumatic.     Mouth/Throat:     Pharynx: No oropharyngeal exudate.  Eyes:     Pupils: Pupils are equal, round, and reactive to light.  Cardiovascular:     Rate and Rhythm: Normal rate and regular rhythm.  Pulmonary:     Effort: No respiratory distress.     Breath sounds: No wheezing.  Abdominal:     General: Bowel sounds are normal. There is no distension.     Palpations: Abdomen is soft. There is no mass.     Tenderness: There is no abdominal tenderness. There is no guarding or rebound.  Musculoskeletal:        General: No tenderness. Normal range of motion.     Cervical back: Normal range of motion and neck supple.  Skin:    General: Skin is warm.  Neurological:     Mental Status: She is alert and oriented to person, place, and time.  Psychiatric:        Mood and  Affect: Affect normal.    LABORATORY DATA:  I have reviewed the data as listed    Component Value Date/Time   NA 135 12/11/2023 1009   K 4.2 12/11/2023 1009   CL 102 12/11/2023 1009   CO2 24 12/11/2023 1009   GLUCOSE 100 (H) 12/11/2023 1009   BUN 17 12/11/2023 1009   CREATININE 0.67 12/11/2023 1009   CALCIUM 9.5 12/11/2023 1009   PROT 7.7 12/11/2023 1009   ALBUMIN 4.3 12/11/2023 1009   AST 20 12/11/2023 1009   ALT 12 12/11/2023 1009   ALKPHOS 61 12/11/2023 1009   BILITOT 0.6 12/11/2023 1009   GFRNONAA >60 12/11/2023 1009   GFRAA >60 10/20/2019 1304    No results found for: SPEP, UPEP  Lab Results  Component Value Date   WBC 5.8 12/11/2023   NEUTROABS 3.4 12/11/2023   HGB 12.6 12/11/2023   HCT 38.3 12/11/2023   MCV 92.5 12/11/2023   PLT 293 12/11/2023      Chemistry      Component Value Date/Time   NA 135 12/11/2023 1009   K 4.2 12/11/2023 1009   CL 102 12/11/2023 1009   CO2 24 12/11/2023 1009   BUN 17 12/11/2023 1009   CREATININE 0.67 12/11/2023 1009      Component Value Date/Time   CALCIUM 9.5 12/11/2023 1009   ALKPHOS 61 12/11/2023 1009   AST 20 12/11/2023 1009   ALT 12 12/11/2023 1009   BILITOT 0.6 12/11/2023 1009       RADIOGRAPHIC STUDIES: I have personally reviewed the radiological images as listed and agreed with the findings in the report. No results found.   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  Adjuvant Xeloda -  S/p cycle #8 [finished May 03, 2018]. SEP 2020- CT C/A/P- NED; BIL Mastectomy- NO mammograms. Clinically no evidence of disease noted-stable.  Not recommending further imaging/tumor markers.stable  # bone health-recommend continue calcium with vitamin D . Patient is currently postmenopausal [TAH+BSO].  Again discussed  regarding bone density.  She will talk to her mother/family.  Otherwise monitor for now.  Patient is physically active. Stable.   # SOB,  and flutters in her heart, skipped a beat one or twice- check echo- and then consider cardiology evaluation-    # Recurrent sinusitis- awaiting referral to with ENT   # DISPOSITION:  # echo  # follow up in 12  months; MD; cbc/cmp;vit D 25-OH levels-Dr.B  Orders Placed This Encounter  Procedures   CBC with Differential (Cancer Center Only)    Standing Status:   Future    Expected Date:   12/10/2024    Expiration Date:   12/10/2024   CMP (Cancer Center only)    Standing Status:   Future    Expected Date:   12/10/2024    Expiration Date:   12/10/2024   VITAMIN D 25 Hydroxy (Vit-D Deficiency, Fractures)    Standing Status:   Future    Expected Date:   12/10/2024    Expiration Date:   12/10/2024   ECHOCARDIOGRAM COMPLETE    Standing Status:   Future    Expected Date:   12/18/2023    Expiration Date:   12/10/2024    Where should this test be performed:   [facility]    Perflutren DEFINITY (image enhancing agent) should be administered unless hypersensitivity or allergy exist:   Administer Perflutren    Reason for exam-Echo:   Chemo  Z09   All questions were answered. The patient knows to call the clinic with any problems, questions or concerns.      Cindy JONELLE Joe, MD 12/11/2023 11:25 AM

## 2024-03-26 ENCOUNTER — Telehealth: Payer: Self-pay

## 2024-03-26 DIAGNOSIS — C50211 Malignant neoplasm of upper-inner quadrant of right female breast: Secondary | ICD-10-CM

## 2024-03-26 NOTE — Telephone Encounter (Signed)
 [id number] Understanding and Predicting Breast Cancer Events after Treatment (UPBEAT)  Study Follow-up - Year 7  This CRC attempted to contact Holly Parker  by phone regarding the Year 7 follow-up for the above study. Patient was not available. A voice message was left with the call back number.  Abelardo Jock Clinical Research Coordinator [phone] 03/26/2024 11:35 AM

## 2024-03-31 ENCOUNTER — Telehealth: Payer: Self-pay

## 2024-03-31 DIAGNOSIS — C50211 Malignant neoplasm of upper-inner quadrant of right female breast: Secondary | ICD-10-CM

## 2024-03-31 NOTE — Telephone Encounter (Signed)
 [id number] [id number] Understanding and Predicting Breast Cancer Events after Treatment (UPBEAT)  Study Follow-up - Year 7   Holly Parker was contacted by phone regarding the Year 7 follow-up for the above study. Patient reported doing well and denied any hospitalizations since the last follow-up call on 03/12/2023. She also denied experiencing any cardiac events, including myocardial infarction (MI), percutaneous coronary intervention (PCI), coronary artery bypass grafting (CABG), heart catheterization, cerebrovascular accident (CVA), or a diagnosis of heart failure since the last contact.   Patient reported she was not aware of the study email regarding the annual follow-up questionnaire and has not completed the Claude online. The KCCQ-12 questionnaire was completed via phone during today's call. Patient confirmed that her email address remains the same. She was informed that the next follow-up call will be in approximately one year.   Patient stated she has no questions at this time and she knows to contact the research team with any study-related concerns in the future. Patient was thanked for her time and for her continued participation in the study.   Abelardo Jock Clinical Research Coordinator [phone] 03/31/2024 4:10 PM

## 2024-05-18 ENCOUNTER — Ambulatory Visit
Admission: RE | Admit: 2024-05-18 | Discharge: 2024-05-18 | Disposition: A | Discharge status: Home / Self Care | Source: Ambulatory Visit | Attending: Internal Medicine | Admitting: Internal Medicine

## 2024-05-18 DIAGNOSIS — Z08 Encounter for follow-up examination after completed treatment for malignant neoplasm: Secondary | ICD-10-CM | POA: Insufficient documentation

## 2024-05-18 DIAGNOSIS — Z9221 Personal history of antineoplastic chemotherapy: Secondary | ICD-10-CM | POA: Diagnosis not present

## 2024-05-18 DIAGNOSIS — Z0189 Encounter for other specified special examinations: Secondary | ICD-10-CM

## 2024-05-18 DIAGNOSIS — Z9010 Acquired absence of unspecified breast and nipple: Secondary | ICD-10-CM | POA: Insufficient documentation

## 2024-05-18 DIAGNOSIS — Z9882 Breast implant status: Secondary | ICD-10-CM | POA: Insufficient documentation

## 2024-05-18 DIAGNOSIS — Z859 Personal history of malignant neoplasm, unspecified: Secondary | ICD-10-CM | POA: Diagnosis not present

## 2024-05-18 LAB — ECHOCARDIOGRAM COMPLETE
AR max vel: 2.15 cm2
AV Area VTI: 2.01 cm2
AV Area mean vel: 2.07 cm2
AV Mean grad: 1 mmHg
AV Peak grad: 2.3 mmHg
Ao pk vel: 0.76 m/s
Area-P 1/2: 4.21 cm2
MV VTI: 1.33 cm2
S' Lateral: 2.7 cm

## 2024-05-18 NOTE — Progress Notes (Signed)
*  PRELIMINARY RESULTS* Echocardiogram 2D Echocardiogram has been performed.  Holly Parker 05/18/2024, 10:47 AM

## 2024-12-10 ENCOUNTER — Ambulatory Visit: Admitting: Internal Medicine

## 2024-12-10 ENCOUNTER — Other Ambulatory Visit
